# Patient Record
Sex: Female | Born: 1950
Health system: Southern US, Community
[De-identification: ages and names within clinical notes are randomized; demographics above are authoritative.]

## PROBLEM LIST (undated history)

## (undated) DIAGNOSIS — G43909 Migraine, unspecified, not intractable, without status migrainosus: Secondary | ICD-10-CM

## (undated) DIAGNOSIS — T4145XA Adverse effect of unspecified anesthetic, initial encounter: Secondary | ICD-10-CM

## (undated) DIAGNOSIS — I4891 Unspecified atrial fibrillation: Secondary | ICD-10-CM

## (undated) DIAGNOSIS — I10 Essential (primary) hypertension: Secondary | ICD-10-CM

## (undated) DIAGNOSIS — Z9289 Personal history of other medical treatment: Secondary | ICD-10-CM

## (undated) DIAGNOSIS — F419 Anxiety disorder, unspecified: Secondary | ICD-10-CM

## (undated) DIAGNOSIS — T8859XA Other complications of anesthesia, initial encounter: Secondary | ICD-10-CM

## (undated) DIAGNOSIS — Z8679 Personal history of other diseases of the circulatory system: Secondary | ICD-10-CM

## (undated) DIAGNOSIS — Z9989 Dependence on other enabling machines and devices: Secondary | ICD-10-CM

## (undated) DIAGNOSIS — G4733 Obstructive sleep apnea (adult) (pediatric): Secondary | ICD-10-CM

## (undated) DIAGNOSIS — K219 Gastro-esophageal reflux disease without esophagitis: Secondary | ICD-10-CM

## (undated) HISTORY — PX: HERNIA REPAIR: SHX51

## (undated) HISTORY — DX: Migraine, unspecified, not intractable, without status migrainosus: G43.909

## (undated) HISTORY — PX: WISDOM TOOTH EXTRACTION: SHX21

## (undated) HISTORY — DX: Essential (primary) hypertension: I10

## (undated) HISTORY — DX: Gastro-esophageal reflux disease without esophagitis: K21.9

---

## 2011-07-15 DIAGNOSIS — K219 Gastro-esophageal reflux disease without esophagitis: Secondary | ICD-10-CM | POA: Insufficient documentation

## 2011-07-15 DIAGNOSIS — I10 Essential (primary) hypertension: Secondary | ICD-10-CM | POA: Insufficient documentation

## 2011-09-16 ENCOUNTER — Ambulatory Visit: Payer: Self-pay | Admitting: Family Medicine

## 2013-05-17 ENCOUNTER — Other Ambulatory Visit: Payer: Self-pay | Admitting: Internal Medicine

## 2015-11-20 DIAGNOSIS — J324 Chronic pansinusitis: Secondary | ICD-10-CM | POA: Insufficient documentation

## 2015-11-20 DIAGNOSIS — K219 Gastro-esophageal reflux disease without esophagitis: Secondary | ICD-10-CM | POA: Insufficient documentation

## 2015-11-20 DIAGNOSIS — H903 Sensorineural hearing loss, bilateral: Secondary | ICD-10-CM | POA: Insufficient documentation

## 2015-11-20 DIAGNOSIS — J3089 Other allergic rhinitis: Secondary | ICD-10-CM | POA: Insufficient documentation

## 2015-11-20 DIAGNOSIS — H9313 Tinnitus, bilateral: Secondary | ICD-10-CM | POA: Insufficient documentation

## 2016-07-19 DIAGNOSIS — I10 Essential (primary) hypertension: Secondary | ICD-10-CM | POA: Insufficient documentation

## 2016-07-27 ENCOUNTER — Telehealth: Payer: Self-pay

## 2016-07-27 NOTE — Telephone Encounter (Signed)
NOTES SENT TO SCHEDULING.  °

## 2016-09-16 DIAGNOSIS — I480 Paroxysmal atrial fibrillation: Secondary | ICD-10-CM | POA: Insufficient documentation

## 2016-09-17 ENCOUNTER — Encounter (INDEPENDENT_AMBULATORY_CARE_PROVIDER_SITE_OTHER): Payer: Self-pay

## 2016-09-17 ENCOUNTER — Ambulatory Visit (INDEPENDENT_AMBULATORY_CARE_PROVIDER_SITE_OTHER): Payer: 59 | Admitting: Interventional Cardiology

## 2016-09-17 ENCOUNTER — Encounter: Payer: Self-pay | Admitting: Interventional Cardiology

## 2016-09-17 VITALS — BP 166/72 | HR 79 | Ht 65.0 in | Wt 217.0 lb

## 2016-09-17 DIAGNOSIS — R0683 Snoring: Secondary | ICD-10-CM

## 2016-09-17 DIAGNOSIS — I10 Essential (primary) hypertension: Secondary | ICD-10-CM

## 2016-09-17 DIAGNOSIS — I4892 Unspecified atrial flutter: Secondary | ICD-10-CM | POA: Diagnosis not present

## 2016-09-17 DIAGNOSIS — R9431 Abnormal electrocardiogram [ECG] [EKG]: Secondary | ICD-10-CM | POA: Diagnosis not present

## 2016-09-17 DIAGNOSIS — I456 Pre-excitation syndrome: Secondary | ICD-10-CM | POA: Diagnosis not present

## 2016-09-17 DIAGNOSIS — Z7901 Long term (current) use of anticoagulants: Secondary | ICD-10-CM | POA: Insufficient documentation

## 2016-09-17 DIAGNOSIS — R0789 Other chest pain: Secondary | ICD-10-CM | POA: Insufficient documentation

## 2016-09-17 NOTE — Progress Notes (Addendum)
Cardiology Office Note    Date:  09/17/2016   ID:  Mary Poole, DOB Feb 21, 1950, MRN [id number]  PCP:  Patient, No Pcp Per  Cardiologist: Lesleigh Noe, MD   Chief Complaint  Patient presents with  . Atrial Fibrillation    History of Present Illness:  Mary Poole is a 66 y.o. female presents as a new patient requesting cardiac assessment of recently diagnosed atrial fibrillation initially identified on 07/18/2016 at [facility] [employer].  The patient is very pleasant. She has had 2 episodes of sudden onset chest pressure with radiation to the left arm and severe dyspnea. The initial episode occurred in late May or very early June. It lasted approximately 20 minutes. On this occasion she called EMS but upon their arrival she had begun feeling better and nothing was found. Approximately 10 days later while in church in [place] she suddenly developed a recurrence of the same symptoms that included left arm discomfort, chest pressure, and dyspnea. Again emergency medical personnel were summoned. An EKG demonstrated atrial atrial flutter with one-to-one conduction at 265 bpm. She was admitted to the hospital, an approximately 25-30 minutes after admission a repeat electrocardiogram revealed atrial flutter with 2-1 AV conduction at a ventricular rate of 153 bpm. The heart rhythm reverted to normal sinus rhythm after a medication was started intravenously. She was discharged from the hospital on beta blocker therapy at a dose 4 times that she had previously taken, metoprolol succinate 25 mg increased to 50 mg twice daily. She has had no recurrent episodes since that time. Records have been reviewed. Echocardiogram demonstrated a structurally normal heart. Potassium was 3.3. Xarelto was also started based on a CHADS VASC of at least 3. No complications since starting Xarelto.  She is seeking a cardiology opinion concerning her ultimate management and  therapy.  Past Medical History:  Diagnosis Date  . Abnormal heart rhythm   . GERD (gastroesophageal reflux disease)   . Hypertension   . Migraine     Past Surgical History:  Procedure Laterality Date  . HERNIA REPAIR    . WISDOM TOOTH EXTRACTION      Current Medications: Outpatient Medications Prior to Visit  Medication Sig Dispense Refill  . metoprolol succinate (TOPROL-XL) 50 MG 24 hr tablet Take 50 mg by mouth daily.    Marland Kitchen omeprazole (PRILOSEC) 20 MG capsule Take 20 mg by mouth daily.    . rivaroxaban (XARELTO) 20 MG TABS tablet Take 20 mg by mouth daily.    Marland Kitchen aspirin EC 81 MG tablet Take 81 mg by mouth daily.     No facility-administered medications prior to visit.      Allergies:   Diphenhydramine hcl   Social History   Social History  . Marital status: Single    Spouse name: N/A  . Number of children: N/A  . Years of education: N/A   Social History Main Topics  . Smoking status: Never Smoker  . Smokeless tobacco: Never Used  . Alcohol use No  . Drug use: No  . Sexual activity: Not Asked   Other Topics Concern  . None   Social History Narrative  . None     Family History:  The patient's family history is not on file. There is no family history of sudden cardiac death, or congestive heart failure. A younger sister who is morbidly obese has had coronary bypass.  ROS:   Please see the history of present illness.  Recent chills, chest pain when her heart is racing, cough, shortness of breath, abdominal pain, dizziness, headache, difficulty with balance. Occasional wheezing, irregular heartbeats, and chest pressure. Snores loudly. Her snoring alarms her husband (patient reports this based on conversations with him).  All other systems reviewed and are negative.   PHYSICAL EXAM:   VS:  BP (!) 166/72 (BP Location: Left Arm)   Pulse 79   Ht 5\' 5"  (1.651 m)   Wt 217 lb (98.4 kg)   BMI 36.11 kg/m    GEN: Well nourished, well developed, in no acute  distress  HEENT: normal  Neck: no JVD, carotid bruits, or masses Cardiac: RRR; no murmurs, rubs, or gallops,no edema  Respiratory:  clear to auscultation bilaterally, normal work of breathing GI: soft, nontender, nondistended, + BS MS: no deformity or atrophy  Skin: warm and dry, no rash Neuro:  Alert and Oriented x 3, Strength and sensation are intact Psych: euthymic mood, full affect  Wt Readings from Last 3 Encounters:  09/17/16 217 lb (98.4 kg)      Studies/Labs Reviewed:   EKG:  EKG  Normal sinus rhythm with short PR interval at 116 ms. Nonspecific T wave flattening noted diffusely. When compared to [facility] the PR interval on the current EKG is slightly longer (previously 96 ms). ST segment and T-wave abnormality on the current EKG is new compared to the old tracing.  Recent Labs: No results found for requested labs within last 8760 hours.   Lipid Panel No results found for: CHOL, TRIG, HDL, CHOLHDL, VLDL, LDLCALC, LDLDIRECT  Additional studies/ records that were reviewed today include:  [facility]ch[facility]ogram performed [facility] on 07/19/2016 demonstrated normal left ventricular function, borderline left ventricular enlargement, diastolic dysfunction, and mild mitral regurgitation. Estimated EF is 55%.  Initial laboratory data revealed a potassium of 3.3, creatinine of 1.07, troponin negative 2 0.02, BNP 147. Hemoglobin A1c is 6.7. LDL is 106.   ASSESSMENT:    1. Atrial flutter with rapid ventricular response (HCC)   2. Chest pressure   3. Shortened PR interval   4. Accessory atrioventricular pathway   5. Snoring   6. Essential hypertension   7. On continuous oral anticoagulation      PLAN:  In order of problems listed above:  1. Agree with continuing full anticoagulation therapy given the patient's risk profile for possible embolic CVA. Continue 50 mg twice a day metoprolol  succinate. Notify us if recurrent weakness/palpitations/chest discomfort. 48-hour Holter monitor will be performed to exclude brief recurrences of atrial fibrillation or atrial flutter. In reviewing the clinical data, it is obvious that the patient has an accessory pathway being able to conduct 1: 1 while in atrial flutter. Her resting EKG also demonstrates evidence of preexcitation with a short PR interval (Lown-Ganong-Levine). We will refer her to electrophysiology for further management. 2. Chest discomfort was likely ischemia mediated, most likely in the setting of supply demand mismatch. A stress Myoview study will be done to exclude obstructive coronary disease given the elevated A1c and her history of hypertension. I have asked her to discontinue aspirin. 3. Probable LGL 4. As discussed above and related to LGL 5. I am suspicious she has sleep apnea which could possibly be a precipitant for her atrial arrhythmia. A sleep study has been scheduled. 6. There is adequate blood pressure control currently. Target should be 130/90 mmHg a less. 7. Continue Xarelto.  66 year old female with recurring episodes of  atrial flutter and documentation of 1:1 AV conduction on EKG. We'll refer to EP for consideration of ablation. A sleep study will be done to rule out OSA as a precipitant for the atrial arrhythmia. An ischemic workup will be done to exclude underlying coronary disease in this patient with multiple risk factors including hypertension, elevated hemoglobin A1c, and age.  Prolonged initial visit with greater than 50% of the time spent in counseling and record review from multiple prior encounters over the past 6 weeks. Clinical follow-up in 3-4 months unless recurrent tachycardia.  Medication Adjustments/Labs and Tests Ordered: Current medicines are reviewed at length with the patient today.  Concerns regarding medicines are outlined above.  Medication changes, Labs and Tests ordered today are listed  in the Patient Instructions below. Patient Instructions  Medication Instructions:  1) STOP Aspirin  Labwork: None  Testing/Procedures: Your physician has recommended that you have a sleep study. This test records several body functions during sleep, including: brain activity, eye movement, oxygen and carbon dioxide blood levels, heart rate and rhythm, breathing rate and rhythm, the flow of air through your mouth and nose, snoring, body muscle movements, and chest and belly movement.  Your physician has requested that you have en exercise stress myoview. For further information please visit [email or web address] Please follow instruction sheet, as given.  Your physician has recommended that you wear a 48 hour holter monitor. Holter monitors are medical devices that record the heart's electrical activity. Doctors most often use these monitors to diagnose arrhythmias. Arrhythmias are problems with the speed or rhythm of the heartbeat. The monitor is a small, portable device. You can wear one while you do your normal daily activities. This is usually used to diagnose what is causing palpitations/syncope (passing out).    Follow-Up: Your physician recommends that you schedule a follow-up appointment next available with one of our electrophysiologist.  Your physician wants you to follow-up in: 6 months with Dr. Katrinka Blazing.  You will receive a reminder letter in the mail two months in advance. If you don't receive a letter, please call our office to schedule the follow-up appointment.    Any Other Special Instructions Will Be Listed Below (If Applicable).     If you need a refill on your cardiac medications before your next appointment, please call your pharmacy.      Signed, Lesleigh Noe, MD  09/17/2016 5:42 PM    [facility] [address] [place], [place], [place]  [address] Phone: [phone]; Fax: [phone]

## 2016-09-17 NOTE — Patient Instructions (Signed)
Medication Instructions:  1) STOP Aspirin  Labwork: None  Testing/Procedures: Your physician has recommended that you have a sleep study. This test records several body functions during sleep, including: brain activity, eye movement, oxygen and carbon dioxide blood levels, heart rate and rhythm, breathing rate and rhythm, the flow of air through your mouth and nose, snoring, body muscle movements, and chest and belly movement.  Your physician has requested that you have en exercise stress myoview. For further information please visit [email or web address]. Please follow instruction sheet, as given.  Your physician has recommended that you wear a 48 hour holter monitor. Holter monitors are medical devices that record the heart's electrical activity. Doctors most often use these monitors to diagnose arrhythmias. Arrhythmias are problems with the speed or rhythm of the heartbeat. The monitor is a small, portable device. You can wear one while you do your normal daily activities. This is usually used to diagnose what is causing palpitations/syncope (passing out).    Follow-Up: Your physician recommends that you schedule a follow-up appointment next available with one of our electrophysiologist.  Your physician wants you to follow-up in: 6 months with Dr. Katrinka BlazingSmith.  You will receive a reminder letter in the mail two months in advance. If you don't receive a letter, please call our office to schedule the follow-up appointment.    Any Other Special Instructions Will Be Listed Below (If Applicable).     If you need a refill on your cardiac medications before your next appointment, please call your pharmacy.

## 2016-09-29 ENCOUNTER — Encounter: Payer: Self-pay | Admitting: Cardiology

## 2016-09-29 ENCOUNTER — Telehealth (HOSPITAL_COMMUNITY): Payer: Self-pay | Admitting: *Deleted

## 2016-09-29 NOTE — Telephone Encounter (Signed)
Attempted to call patient regarding upcoming nuclear appointment- no answer.  Mary Poole  

## 2016-10-01 ENCOUNTER — Ambulatory Visit (INDEPENDENT_AMBULATORY_CARE_PROVIDER_SITE_OTHER): Payer: 59

## 2016-10-01 ENCOUNTER — Ambulatory Visit (HOSPITAL_COMMUNITY): Payer: 59 | Attending: Cardiovascular Disease

## 2016-10-01 DIAGNOSIS — R0609 Other forms of dyspnea: Secondary | ICD-10-CM | POA: Diagnosis not present

## 2016-10-01 DIAGNOSIS — R0789 Other chest pain: Secondary | ICD-10-CM | POA: Diagnosis not present

## 2016-10-01 DIAGNOSIS — Z8249 Family history of ischemic heart disease and other diseases of the circulatory system: Secondary | ICD-10-CM | POA: Insufficient documentation

## 2016-10-01 DIAGNOSIS — I4892 Unspecified atrial flutter: Secondary | ICD-10-CM

## 2016-10-01 DIAGNOSIS — I10 Essential (primary) hypertension: Secondary | ICD-10-CM | POA: Diagnosis not present

## 2016-10-01 DIAGNOSIS — I4891 Unspecified atrial fibrillation: Secondary | ICD-10-CM | POA: Insufficient documentation

## 2016-10-01 DIAGNOSIS — I2510 Atherosclerotic heart disease of native coronary artery without angina pectoris: Secondary | ICD-10-CM | POA: Insufficient documentation

## 2016-10-01 DIAGNOSIS — R079 Chest pain, unspecified: Secondary | ICD-10-CM | POA: Diagnosis present

## 2016-10-01 MED ORDER — TECHNETIUM TC 99M TETROFOSMIN IV KIT
33.0000 | PACK | Freq: Once | INTRAVENOUS | Status: AC | PRN
  Administered 2016-10-01: 33 via INTRAVENOUS
  Filled 2016-10-01: qty 33

## 2016-10-01 MED ORDER — REGADENOSON 0.4 MG/5ML IV SOLN
0.4000 mg | Freq: Once | INTRAVENOUS | Status: AC
  Administered 2016-10-01: 0.4 mg via INTRAVENOUS

## 2016-10-06 ENCOUNTER — Ambulatory Visit (HOSPITAL_COMMUNITY): Payer: 59 | Attending: Cardiology

## 2016-10-06 ENCOUNTER — Telehealth: Payer: Self-pay | Admitting: *Deleted

## 2016-10-06 LAB — MYOCARDIAL PERFUSION IMAGING
CHL CUP NUCLEAR SDS: 4
CHL CUP NUCLEAR SRS: 10
CHL CUP RESTING HR STRESS: 80 {beats}/min
CSEPPHR: 106 {beats}/min
LHR: 0.27
LV dias vol: 87 mL (ref 46–106)
LV sys vol: 36 mL
NUC STRESS TID: 1.06
SSS: 14

## 2016-10-06 MED ORDER — TECHNETIUM TC 99M TETROFOSMIN IV KIT
32.0000 | PACK | Freq: Once | INTRAVENOUS | Status: AC | PRN
  Administered 2016-10-06: 32 via INTRAVENOUS
  Filled 2016-10-06: qty 32

## 2016-10-06 NOTE — Telephone Encounter (Addendum)
Informed patient of upcoming sleep study and patient understanding was verbalized. Patient understands her sleep study is scheduled for Sunday November 14 2016. Patient understands [facility]. Patient understands she will receive a sleep packet in a week or so. Patient understands to call if she does not receive the sleep packet in a timely manner. Patient agrees with treatment and thanked me for call. ESS= 14

## 2016-10-15 ENCOUNTER — Other Ambulatory Visit: Payer: Self-pay | Admitting: *Deleted

## 2016-10-15 MED ORDER — RIVAROXABAN 20 MG PO TABS
20.0000 mg | ORAL_TABLET | Freq: Every day | ORAL | 2 refills | Status: DC
Start: 2016-10-15 — End: 2017-02-24

## 2016-10-18 ENCOUNTER — Telehealth: Payer: Self-pay | Admitting: *Deleted

## 2016-10-18 NOTE — Telephone Encounter (Signed)
Attempted to call pt.  Message came on stating not available and to try again later.  Then just had beeping.  Unable to leave message.  Will try again later.

## 2016-10-18 NOTE — Telephone Encounter (Signed)
Patient called and requested a refill on metoprolol however she stated that she is taking metoprolol tartrate 50 mg bid but her med list has metoprolol succinate 50 mg qd listed. Please advise. Thanks, MI

## 2016-10-19 NOTE — Telephone Encounter (Signed)
Attempted to contact pt and received same message and beeping.  Will try again later.

## 2016-10-20 ENCOUNTER — Encounter: Payer: Self-pay | Admitting: Cardiology

## 2016-10-20 ENCOUNTER — Ambulatory Visit (INDEPENDENT_AMBULATORY_CARE_PROVIDER_SITE_OTHER): Payer: 59 | Admitting: Cardiology

## 2016-10-20 VITALS — BP 158/100 | HR 84 | Ht 65.0 in | Wt 215.6 lb

## 2016-10-20 DIAGNOSIS — I10 Essential (primary) hypertension: Secondary | ICD-10-CM

## 2016-10-20 DIAGNOSIS — I4892 Unspecified atrial flutter: Secondary | ICD-10-CM | POA: Diagnosis not present

## 2016-10-20 MED ORDER — CARVEDILOL 12.5 MG PO TABS: 12.5000 mg | ORAL_TABLET | Freq: Two times a day (BID) | ORAL | 3 refills | Status: DC

## 2016-10-20 NOTE — Progress Notes (Signed)
Electrophysiology Office Note   Date:  10/20/2016   ID:  Mary Poole, DOB 24-Jul-1950, MRN [id number]  PCP:  Patient, No Pcp Per  Cardiologist:  Katrinka Blazing Primary Electrophysiologist:  Mary Porto Jorja Loa, MD    Chief Complaint  Patient presents with  . Advice Only    AFlutter/Discuss ablation     History of Present Illness: Mary Poole is a 66 y.o. female who is being seen today for the evaluation of atrial flutter at the request of No ref. provider found. Presenting today for electrophysiology evaluation. She has a history of hypertension and palpitations. She was diagnosed with atrial fibrillation on 07/18/16. She had 2 episodes of chest pressure with radiation to her arm in late May or early June. It lasted 20 minutes. EMS was called but she felt better. 10 days later, she had similar symptoms. She went to the emergency room where she had possible atrial flutter with one-to-one conduction at a rate of 265 bpm. 25-30 minutes after admission repeat EKG showed atrial flutter with 2 to one conduction and a ventricular rate in the 153. She was given IV medications and she reverted to sinus rhythm. She was discharged on beta blocker. She was started on Xarelto.    Today, she denies symptoms of palpitations, chest pain, shortness of breath, orthopnea, PND, lower extremity edema, claudication, dizziness, presyncope, syncope, bleeding, or neurologic sequela. The patient is tolerating medications without difficulties.    Past Medical History:  Diagnosis Date  . Abnormal heart rhythm   . GERD (gastroesophageal reflux disease)   . Hypertension   . Migraine    Past Surgical History:  Procedure Laterality Date  . HERNIA REPAIR    . WISDOM TOOTH EXTRACTION       Current Outpatient Prescriptions  Medication Sig Dispense Refill  . metoprolol succinate (TOPROL-XL) 50 MG 24 hr tablet Take 50 mg by mouth daily.    Marland Kitchen omeprazole (PRILOSEC) 20 MG capsule Take 20 mg by mouth daily.    .  rivaroxaban (XARELTO) 20 MG TABS tablet Take 1 tablet (20 mg total) by mouth daily. 90 tablet 2   No current facility-administered medications for this visit.     Allergies:   Diphenhydramine hcl   Social History:  The patient  reports that she has never smoked. She has never used smokeless tobacco. She reports that she does not drink alcohol or use drugs.   Family History:  The patient's family history is not on file.    ROS:  Please see the history of present illness.   Otherwise, review of systems is positive for none.   All other systems are reviewed and negative.    PHYSICAL EXAM: VS:  BP (!) 158/100   Pulse 84   Ht  (1.651 m)   Wt 215 lb 9.6 oz (97.8 kg)   BMI 35.88 kg/m  , BMI Body mass index is 35.88 kg/m. GEN: Well nourished, well developed, in no acute distress  HEENT: normal  Neck: no JVD, carotid bruits, or masses Cardiac: RRR; no murmurs, rubs, or gallops,no edema  Respiratory:  clear to auscultation bilaterally, normal work of breathing GI: soft, nontender, nondistended, + BS MS: no deformity or atrophy  Skin: warm and dry Neuro:  Strength and sensation are intact Psych: euthymic mood, full affect  EKG:  EKG is not ordered today. Personal review of the ekg ordered 09/17/16 shows sinus rhythm, short PR interval, 116 ms  Recent Labs: No results found for requested labs  within last 8760 hours.    Lipid Panel  No results found for: CHOL, TRIG, HDL, CHOLHDL, VLDL, LDLCALC, LDLDIRECT   Wt Readings from Last 3 Encounters:  10/20/16 215 lb 9.6 oz (97.8 kg)  09/17/16 217 lb (98.4 kg)      Other studies Reviewed: Additional studies/ records that were reviewed today include: Myoview 10/06/16  Review of the above records today demonstrates:   Nuclear stress EF: 58%. The left ventricular ejection fraction is normal (55-65%).  The study is normal. There is breast attenuation. No ischemia . no evidence of infarction  This is a low risk study.  Holter  10/09/16 - personally reviewed  Basic rhythm is NSR rare PAC's and PVC's.  Brief SVT < 10 beats.  Heart rate range 43-113 bpm with average 63 bpm.   NSR Normal study  TTE 07/19/16 LEFT VENTRICLE The left ventricle is borderline dilated. There is normal left ventricular  wall thickness. Left ventricular systolic function is normal. LV ejection  fraction = 50-55%. Left ventricular filling pattern is indeterminate. No  segmental wall motion abnormalities seen in the left ventricle. LEFT ATRIUM The left atrium is mildly dilated. AORTIC VALVE The aortic valve is not well visualized. There is no aortic regurgitation.    ASSESSMENT AND PLAN:  1.  Atrial flutter with rapid ventricular response: Currently on Xarelto. She is felt well since starting her Toprol-XL. We'll switch her to carvedilol 12.5 mg twice a day. We discussed ablation. Risks and benefits were discussed. Risks include bleeding, tamponade, heart block, stroke, and damage to surrounding organs. She would like to try medical management at this time. She does not have an obvious delta wave, I feel it is safe, and it is likely that rate control Gertrude Tarbet help to decrease the rate of 1:1 flutter in the future. In the interim, we'll work to get the records from her hospital visit as well as her EKGs of tachycardia.  This patients CHA2DS2-VASc Score and unadjusted Ischemic Stroke Rate (% per year) is equal to 3.2 % stroke rate/year from a score of 3  Above score calculated as 1 point each if present [CHF, HTN, DM, Vascular=MI/PAD/Aortic Plaque, Age if 65-74, or Female] Above score calculated as 2 points each if present [Age > 75, or Stroke/TIA/TE]  2. Short PR interval: Possibly has an accessory pathway, but there is no obvious a delta wave seen on her EKG. She could simply have very fast AV conduction, though this would be abnormal. We'll do an EP study to determine the presence of a pathway.  3. Hypertension: Blood pressure elevated  today. We'll switch from Toprol-XL to carvedilol.  4. Snoring: Sleep study scheduled.     Current medicines are reviewed at length with the patient today.   The patient does not have concerns regarding her medicines.  The following changes were made today:  Stop Toprol-XL, start carvedilol  Labs/ tests ordered today include:  No orders of the defined types were placed in this encounter.    Disposition:   FU with Keaira Whitehurst 3 months  Signed, Iraida Cragin Jorja LoaMartin Vernisha Bacote, MD  10/20/2016 2:11 PM     [facility] [address] [place] [id number] [phone] (office) [phone] (fax)

## 2016-10-20 NOTE — Patient Instructions (Addendum)
Medication Instructions:  Your physician has recommended you make the following change in your medication: 1. STOP Toprol 2. START Carvedilol 12.5 mg twice daily   If you need a refill on your cardiac medications before your next appointment, please call your pharmacy.   Labwork: None ordered  Testing/Procedures: None ordered  Follow-Up: Your physician recommends that you schedule a follow-up appointment in: 3 months with Dr. Elberta Fortisamnitz.  Thank you for choosing [facility]!!   Dory HornSherri Natalyah Cummiskey, RN [phone]  Any Other Special Instructions Will Be Listed Below (If Applicable).  Carvedilol tablets What is this medicine? CARVEDILOL (KAR ve dil ol) is a beta-blocker. Beta-blockers reduce the workload on the heart and help it to beat more regularly. This medicine is used to treat high blood pressure and heart failure. This medicine may be used for other purposes; ask your health care provider or pharmacist if you have questions. COMMON BRAND NAME(S): Coreg What should I tell my health care provider before I take this medicine? They need to know if you have any of these conditions: -circulation problems -diabetes -history of heart attack or heart disease -liver disease -lung or breathing disease, like asthma or emphysema -pheochromocytoma -slow or irregular heartbeat -thyroid disease -an unusual or allergic reaction to carvedilol, other beta-blockers, medicines, foods, dyes, or preservatives -pregnant or trying to get pregnant -breast-feeding How should I use this medicine? Take this medicine by mouth with a glass of water. Follow the directions on the prescription label. It is best to take the tablets with food. Take your doses at regular intervals. Do not take your medicine more often than directed. Do not stop taking except on the advice of your doctor or health care professional. Talk to your pediatrician regarding the use of this medicine in children. Special care may be  needed. Overdosage: If you think you have taken too much of this medicine contact a poison control center or emergency room at once. NOTE: This medicine is only for you. Do not share this medicine with others. What if I miss a dose? If you miss a dose, take it as soon as you can. If it is almost time for your next dose, take only that dose. Do not take double or extra doses. What may interact with this medicine? This medicine may interact with the following medications: -certain medicines for blood pressure, heart disease, irregular heart beat -certain medicines for depression, like fluoxetine or paroxetine -certain medicines for diabetes, like glipizide or glyburide -cimetidine -clonidine -cyclosporine -digoxin -MAOIs like Carbex, Eldepryl, Marplan, Nardil, and Parnate -reserpine -rifampin This list may not describe all possible interactions. Give your health care provider a list of all the medicines, herbs, non-prescription drugs, or dietary supplements you use. Also tell them if you smoke, drink alcohol, or use illegal drugs. Some items may interact with your medicine. What should I watch for while using this medicine? Check your heart rate and blood pressure regularly while you are taking this medicine. Ask your doctor or health care professional what your heart rate and blood pressure should be, and when you should contact him or her. Do not stop taking this medicine suddenly. This could lead to serious heart-related effects. Contact your doctor or health care professional if you have difficulty breathing while taking this drug. Check your weight daily. Ask your doctor or health care professional when you should notify him/her of any weight gain. You may get drowsy or dizzy. Do not drive, use machinery, or do anything that requires mental alertness  until you know how this medicine affects you. To reduce the risk of dizzy or fainting spells, do not sit or stand up quickly. Alcohol can make  you more drowsy, and increase flushing and rapid heartbeats. Avoid alcoholic drinks. If you have diabetes, check your blood sugar as directed. Tell your doctor if you have changes in your blood sugar while you are taking this medicine. If you are going to have surgery, tell your doctor or health care professional that you are taking this medicine. What side effects may I notice from receiving this medicine? Side effects that you should report to your doctor or health care professional as soon as possible: -allergic reactions like skin rash, itching or hives, swelling of the face, lips, or tongue -breathing problems -dark urine -irregular heartbeat -swollen legs or ankles -vomiting -yellowing of the eyes or skin Side effects that usually do not require medical attention (report to your doctor or health care professional if they continue or are bothersome): -change in sex drive or performance -diarrhea -dry eyes (especially if wearing contact lenses) -dry, itching skin -headache -nausea -unusually tired This list may not describe all possible side effects. Call your doctor for medical advice about side effects. You may report side effects to FDA at 1-800-FDA-1088. Where should I keep my medicine? Keep out of the reach of children. Store at room temperature below 30 degrees C (86 degrees F). Protect from moisture. Keep container tightly closed. Throw away any unused medicine after the expiration date. NOTE: This sheet is a summary. It may not cover all possible information. If you have questions about this medicine, talk to your doctor, pharmacist, or health care provider.  2018 Elsevier/Gold Standard (2012-10-01 14:12:02)

## 2016-10-21 ENCOUNTER — Telehealth: Payer: Self-pay | Admitting: Cardiology

## 2016-10-21 NOTE — Telephone Encounter (Signed)
[facility].

## 2016-10-21 NOTE — Telephone Encounter (Signed)
Pt seen by Dr. Elberta Fortisamnitz yesterday and switched from Metoprolol to Carvedilol.

## 2016-10-25 ENCOUNTER — Telehealth: Payer: Self-pay | Admitting: Cardiology

## 2016-10-25 NOTE — Telephone Encounter (Signed)
Records received From [facility] placed in Chart Prep.

## 2016-11-09 ENCOUNTER — Encounter: Payer: Self-pay | Admitting: *Deleted

## 2016-11-14 ENCOUNTER — Encounter (HOSPITAL_BASED_OUTPATIENT_CLINIC_OR_DEPARTMENT_OTHER): Payer: 59

## 2016-11-25 ENCOUNTER — Telehealth: Payer: Self-pay | Admitting: *Deleted

## 2016-11-25 NOTE — Telephone Encounter (Signed)
-----   Message from Haywood FillerAmy C Higgins sent at 11/25/2016  1:56 PM EDT ----- Regarding: In lab denied-need to cancel In lab study has been denied. Have sent message to Dr. Katrinka BlazingSmith to advise how to proceed. Study needs to be canceled for now. Thanks, Amy

## 2016-11-25 NOTE — Telephone Encounter (Signed)
Sleep study cancelled 11/25/2016.

## 2016-11-26 ENCOUNTER — Encounter (HOSPITAL_BASED_OUTPATIENT_CLINIC_OR_DEPARTMENT_OTHER): Payer: 59

## 2016-11-29 NOTE — Telephone Encounter (Signed)
RE: in lab denied  Mary Poole, Amy C  [name], CMA        Dr. Katrinka BlazingSmith has approved in home study. Thanks, Amy    Informed patient of upcoming Home Sleep study and patient understanding was verbalized. Patient understands her sleep [facility]. Patient understands she will receive a CALL in a week or so. Patient understands to call if she does not receive that CALL in a timely manner. Patient agrees with treatment and thanked me for call.

## 2016-12-07 NOTE — Telephone Encounter (Signed)
All paperwork needed faxed to [facility].

## 2016-12-17 ENCOUNTER — Telehealth: Payer: Self-pay | Admitting: Interventional Cardiology

## 2016-12-17 NOTE — Telephone Encounter (Signed)
Patient informed that the results have not been read by Dr. Mayford Knifeurner and she will receive a call from Coralee Northina once the results have been read. Patient verbalized understanding and thanked me for the call.

## 2016-12-17 NOTE — Telephone Encounter (Signed)
ollow Up:    Pt would like the results from her in home sleep study results please.

## 2016-12-21 ENCOUNTER — Telehealth: Payer: Self-pay | Admitting: *Deleted

## 2016-12-21 DIAGNOSIS — G4733 Obstructive sleep apnea (adult) (pediatric): Secondary | ICD-10-CM

## 2016-12-21 DIAGNOSIS — R0902 Hypoxemia: Secondary | ICD-10-CM

## 2016-12-21 NOTE — Telephone Encounter (Signed)
-----   Message from Quintella Reichertraci R Turner, MD sent at 12/19/2016  9:19 PM EST ----- Mild to moderate OSA with Oxygen desats at low as 80%  - please set up in lab CPAP titration

## 2016-12-21 NOTE — Telephone Encounter (Signed)
Informed patient of sleep study results and patient understanding was verbalized. Patient understands she has mild to moderate OSA with oxygen desats as low as 80%. Patient understands Dr Mayford Knifeurner recommends an in lab CPAP titration. Patient agrees with treatment plan and thanked me for calling.

## 2017-01-18 ENCOUNTER — Ambulatory Visit: Payer: 59 | Admitting: Cardiology

## 2017-01-19 ENCOUNTER — Other Ambulatory Visit: Payer: Self-pay | Admitting: *Deleted

## 2017-01-20 MED ORDER — CARVEDILOL 12.5 MG PO TABS: 12.5000 mg | ORAL_TABLET | Freq: Two times a day (BID) | ORAL | 3 refills | Status: DC

## 2017-01-21 ENCOUNTER — Ambulatory Visit: Payer: 59 | Admitting: Cardiology

## 2017-01-21 ENCOUNTER — Encounter: Payer: Self-pay | Admitting: Cardiology

## 2017-01-21 VITALS — BP 124/82 | HR 87 | Ht 65.0 in | Wt 216.0 lb

## 2017-01-21 DIAGNOSIS — I10 Essential (primary) hypertension: Secondary | ICD-10-CM

## 2017-01-21 DIAGNOSIS — I483 Typical atrial flutter: Secondary | ICD-10-CM

## 2017-01-21 DIAGNOSIS — G4733 Obstructive sleep apnea (adult) (pediatric): Secondary | ICD-10-CM

## 2017-01-21 NOTE — Patient Instructions (Signed)
Medication Instructions:  Your physician recommends that you continue on your current medications as directed. Please refer to the Current Medication list given to you today.  If you need a refill on your cardiac medications before your next appointment, please call your pharmacy.   Labwork: None ordered  Testing/Procedures: None ordered  Follow-Up: Your physician wants you to follow-up in: 6 months with Dr. Camnitz.  You will receive a reminder letter in the mail two months in advance. If you don't receive a letter, please call our office to schedule the follow-up appointment.  Thank you for choosing [facility]!!   Michel Eskelson, RN [phone]         

## 2017-01-21 NOTE — Progress Notes (Signed)
Electrophysiology Office Note   Date:  01/21/2017   ID:  Mary Poole, DOB 05/10/1950, MRN [id number]  PCP:  Patient, No Pcp Per  Cardiologist:  Katrinka BlazingSmith Primary Electrophysiologist:  Jarrette Dehner Jorja LoaMartin Lakashia Collison, MD    Chief Complaint  Patient presents with  . Atrial Flutter     History of Present Illness: Mary Poole is a 66 y.o. female who is being seen today for the evaluation of atrial flutter at the request of No ref. provider found. Presenting today for electrophysiology evaluation. She has a history of hypertension and palpitations. She was diagnosed with atrial fibrillation on 07/18/16. She had 2 episodes of chest pressure with radiation to her arm in late May or early June. It lasted 20 minutes. EMS was called but she felt better. 10 days later, she had similar symptoms.  Went to the emergency room where she had a wide-complex tachycardia.  While in the emergency room, her rate slowed and showed a likely atrial flutter.  It was thought that this was the same rhythm with the wide-complex tachycardia being apparent 1-1 conduction.  She reverted to sinus rhythm with IV medications.    Today, denies symptoms of palpitations, chest pain, shortness of breath, orthopnea, PND, lower extremity edema, claudication, dizziness, presyncope, syncope, bleeding, or neurologic sequela. The patient is tolerating medications without difficulties.  She has been feeling well without major complaint.  She has not noted any further episodes of atrial flutter.  She has determined that caffeine is a trigger for her rhythm abnormalities.   Past Medical History:  Diagnosis Date  . Abnormal heart rhythm   . GERD (gastroesophageal reflux disease)   . Hypertension   . Migraine    Past Surgical History:  Procedure Laterality Date  . HERNIA REPAIR    . WISDOM TOOTH EXTRACTION       Current Outpatient Medications  Medication Sig Dispense Refill  . carvedilol (COREG) 12.5 MG tablet Take 1 tablet (12.5 mg  total) by mouth 2 (two) times daily. 60 tablet 3  . omeprazole (PRILOSEC) 20 MG capsule Take 20 mg by mouth daily.    . rivaroxaban (XARELTO) 20 MG TABS tablet Take 1 tablet (20 mg total) by mouth daily. 90 tablet 2   No current facility-administered medications for this visit.     Allergies:   Diphenhydramine hcl   Social History:  The patient  reports that  has never smoked. she has never used smokeless tobacco. She reports that she does not drink alcohol or use drugs.   Family History:  The patient's family history includes AAA (abdominal aortic aneurysm) in her sister; CAD in her father; Thyroid disease in her sister.    ROS:  Please see the history of present illness.   Otherwise, review of systems is positive for snoring, wheezing, dyspnea on exertion.   All other systems are reviewed and negative.   PHYSICAL EXAM: VS:  BP 124/82   Pulse 87   Ht 5\' 5"  (1.651 m)   Wt 216 lb (98 kg)   BMI 35.94 kg/m  , BMI Body mass index is 35.94 kg/m. GEN: Well nourished, well developed, in no acute distress  HEENT: normal  Neck: no JVD, carotid bruits, or masses Cardiac: RRR; no murmurs, rubs, or gallops,no edema  Respiratory:  clear to auscultation bilaterally, normal work of breathing GI: soft, nontender, nondistended, + BS MS: no deformity or atrophy  Skin: warm and dry Neuro:  Strength and sensation are intact Psych: euthymic mood, full  affect  EKG:  EKG is ordered today. Personal review of the ekg ordered shows SR, short PR 118 msec   Recent Labs: No results found for requested labs within last 8760 hours.    Lipid Panel  No results found for: CHOL, TRIG, HDL, CHOLHDL, VLDL, LDLCALC, LDLDIRECT   Wt Readings from Last 3 Encounters:  01/21/17 216 lb (98 kg)  10/20/16 215 lb 9.6 oz (97.8 kg)  09/17/16 217 lb (98.4 kg)      Other studies Reviewed: Additional studies/ records that were reviewed today include: Myoview 10/06/16  Review of the above records today  demonstrates:   Nuclear stress EF: 58%. The left ventricular ejection fraction is normal (55-65%).  The study is normal. There is breast attenuation. No ischemia . no evidence of infarction  This is a low risk study.  Holter 10/09/16 - personally reviewed  Basic rhythm is NSR rare PAC's and PVC's.  Brief SVT < 10 beats.  Heart rate range 43-113 bpm with average 63 bpm.   NSR Normal study  TTE 07/19/16 LEFT VENTRICLE The left ventricle is borderline dilated. There is normal left ventricular  wall thickness. Left ventricular systolic function is normal. LV ejection  fraction = 50-55%. Left ventricular filling pattern is indeterminate. No  segmental wall motion abnormalities seen in the left ventricle. LEFT ATRIUM The left atrium is mildly dilated. AORTIC VALVE The aortic valve is not well visualized. There is no aortic regurgitation.    ASSESSMENT AND PLAN:  1.  Atrial flutter with rapid ventricular response: Currently on Xarelto and carvedilol.  She had wanted to try medical management prior to ablation.  EKGs scanned into the chart show a wide-complex tachycardia transitions to atrial flutter which appears to be typical.  It is likely that this was a one-to-one atrial flutter.  Feeling well on her current treatment.  No changes at the  This patients CHA2DS2-VASc Score and unadjusted Ischemic Stroke Rate (% per year) is equal to 3.2 % stroke rate/year from a score of 3  Above score calculated as 1 point each if present [CHF, HTN, DM, Vascular=MI/PAD/Aortic Plaque, Age if 65-74, or Female] Above score calculated as 2 points each if present [Age > 75, or Stroke/TIA/TE]   2. Short PR interval: Possibly has an accessory pathway but no obvious delta wave seen on her EKG.  She likely just has very fast AV conduction.  Should she have recurrent atrial flutter, we Antonette Hendricks plan to do an EP study.  No other changes at this time.  3. Hypertension: Well-controlled today.  No changes.  4.  OSA: Had a home sleep study but was told she needed a sleep study at the sleep clinic.  Awaiting to schedule.     Current medicines are reviewed at length with the patient today.   The patient does not have concerns regarding her medicines.  The following changes were made today:  none  Labs/ tests ordered today include:  Orders Placed This Encounter  Procedures  . EKG 12-Lead     Disposition:   FU with Kelaiah Escalona 6 months  Signed, Letita Prentiss Jorja LoaMartin Qamar Rosman, MD  01/21/2017 2:51 PM     [facility] [address] [place] [address] [phone] (office) [phone] (fax)

## 2017-02-08 DIAGNOSIS — Z9289 Personal history of other medical treatment: Secondary | ICD-10-CM

## 2017-02-08 HISTORY — DX: Personal history of other medical treatment: Z92.89

## 2017-02-15 ENCOUNTER — Telehealth: Payer: Self-pay | Admitting: Cardiology

## 2017-02-15 NOTE — Telephone Encounter (Signed)
Mary Poole is calling because she has been feeling extreme fatigue and light headiness. Also she can hear her heart beating in her ears. Please call

## 2017-02-15 NOTE — Telephone Encounter (Signed)
Pt reports fatigue/light headedness that comes and goes since weekend.  She has experienced SOB since Saturday.   She also reports "I can feel my heartbeat beating in my ears" (pt dx w/ tinnitus 2 yrs ago)  Denies any CP, syncope and/or swelling. She states that Saturday was extreme fatigue and by Sunday she couldn't do anything.  However, by Monday she was slightly improved and went to work.  Today she is better, but not completely. BP WNL & HRs avg 77-82. Pt understands I will have Dr. Elberta Fortisamnitz review and call her w/ any recommendation/s he may have. Patient verbalized understanding and agreeable to plan.

## 2017-02-17 ENCOUNTER — Telehealth: Payer: Self-pay | Admitting: Cardiology

## 2017-02-17 NOTE — Telephone Encounter (Signed)
Advised patient that Dr. Elberta Fortisamnitz reviewed and doesn't think issues/symptoms are r/t heart rhythm.  Patient then tells me that she went to her PCP yesterday and was diagnosed w/ sinusitis and this was the problem.  She appreciates the call but wishes it had been sooner.   She did tell me that she very disappointed in our office though.  I spent 20 minutes listening to her about her sleep study issues and how she has had to "chase us down for answers".  She was told she needed CPAP and then told she didn't.  She is very upset about how all of this has been handled.  Informed patient that I would speak with management and ask them to call her to discuss further.  She is agreeable and looks forward to talking with someone about "this horrible experience".

## 2017-02-18 NOTE — Telephone Encounter (Signed)
Reached back out to the patient to informed her that our pre-cert department is going to resubmit her CPAP titration on Monday 02/21/17. Patient understands that if she has not heard back from our office within 2 weeks to call us back to inquire about the resubmission of her test. Patient thanked me for calling again.

## 2017-02-18 NOTE — Telephone Encounter (Signed)
Patient called inquiring about her CPAP titration and after speaking with Amy in pre-cert I learned the titration has been denied by patients insurance. Reached out to the patient to relay the information and was informed by the patient that she contacted her   insurance carrier and was told her titration was denied because of lack of information. Informed patient I would contact our Pre-cert department and get back to her. Patient was grateful for the call and thanked me.

## 2017-02-22 ENCOUNTER — Observation Stay (HOSPITAL_COMMUNITY)
Admission: EM | Admit: 2017-02-22 | Discharge: 2017-02-24 | Disposition: A | Discharge status: Home / Self Care | Payer: 59 | Attending: Nephrology | Admitting: Nephrology

## 2017-02-22 ENCOUNTER — Other Ambulatory Visit: Payer: Self-pay

## 2017-02-22 ENCOUNTER — Encounter (HOSPITAL_COMMUNITY): Payer: Self-pay | Admitting: Emergency Medicine

## 2017-02-22 ENCOUNTER — Emergency Department (HOSPITAL_COMMUNITY): Payer: 59

## 2017-02-22 DIAGNOSIS — K31811 Angiodysplasia of stomach and duodenum with bleeding: Principal | ICD-10-CM | POA: Insufficient documentation

## 2017-02-22 DIAGNOSIS — D649 Anemia, unspecified: Secondary | ICD-10-CM

## 2017-02-22 DIAGNOSIS — I10 Essential (primary) hypertension: Secondary | ICD-10-CM | POA: Diagnosis not present

## 2017-02-22 DIAGNOSIS — Z8249 Family history of ischemic heart disease and other diseases of the circulatory system: Secondary | ICD-10-CM | POA: Diagnosis not present

## 2017-02-22 DIAGNOSIS — I480 Paroxysmal atrial fibrillation: Secondary | ICD-10-CM | POA: Diagnosis not present

## 2017-02-22 DIAGNOSIS — R0602 Shortness of breath: Secondary | ICD-10-CM | POA: Diagnosis not present

## 2017-02-22 DIAGNOSIS — Z87891 Personal history of nicotine dependence: Secondary | ICD-10-CM | POA: Diagnosis not present

## 2017-02-22 DIAGNOSIS — D509 Iron deficiency anemia, unspecified: Secondary | ICD-10-CM | POA: Diagnosis not present

## 2017-02-22 DIAGNOSIS — K5730 Diverticulosis of large intestine without perforation or abscess without bleeding: Secondary | ICD-10-CM | POA: Diagnosis not present

## 2017-02-22 DIAGNOSIS — D62 Acute posthemorrhagic anemia: Secondary | ICD-10-CM | POA: Diagnosis not present

## 2017-02-22 DIAGNOSIS — Z888 Allergy status to other drugs, medicaments and biological substances status: Secondary | ICD-10-CM | POA: Diagnosis not present

## 2017-02-22 DIAGNOSIS — Z7901 Long term (current) use of anticoagulants: Secondary | ICD-10-CM | POA: Insufficient documentation

## 2017-02-22 DIAGNOSIS — J329 Chronic sinusitis, unspecified: Secondary | ICD-10-CM | POA: Diagnosis not present

## 2017-02-22 DIAGNOSIS — Z6835 Body mass index (BMI) 35.0-35.9, adult: Secondary | ICD-10-CM | POA: Diagnosis not present

## 2017-02-22 DIAGNOSIS — K219 Gastro-esophageal reflux disease without esophagitis: Secondary | ICD-10-CM | POA: Insufficient documentation

## 2017-02-22 DIAGNOSIS — I4892 Unspecified atrial flutter: Secondary | ICD-10-CM | POA: Insufficient documentation

## 2017-02-22 DIAGNOSIS — Z79899 Other long term (current) drug therapy: Secondary | ICD-10-CM | POA: Diagnosis not present

## 2017-02-22 HISTORY — DX: Personal history of other diseases of the circulatory system: Z86.79

## 2017-02-22 LAB — CBC
HEMATOCRIT: 24.1 % — AB (ref 36.0–46.0)
Hemoglobin: 7.4 g/dL — ABNORMAL LOW (ref 12.0–15.0)
MCH: 25.7 pg — AB (ref 26.0–34.0)
MCHC: 30.7 g/dL (ref 30.0–36.0)
MCV: 83.7 fL (ref 78.0–100.0)
Platelets: 380 10*3/uL (ref 150–400)
RBC: 2.88 MIL/uL — ABNORMAL LOW (ref 3.87–5.11)
RDW: 15.6 % — AB (ref 11.5–15.5)
WBC: 11.3 10*3/uL — ABNORMAL HIGH (ref 4.0–10.5)

## 2017-02-22 LAB — ABO/RH: ABO/RH(D): O POS

## 2017-02-22 LAB — FOLATE: FOLATE: 13.5 ng/mL (ref >5.9)

## 2017-02-22 LAB — BASIC METABOLIC PANEL
Anion gap: 7 (ref 5–15)
BUN: 15 mg/dL (ref 6–20)
CHLORIDE: 106 mmol/L (ref 101–111)
CO2: 26 mmol/L (ref 22–32)
Calcium: 9.2 mg/dL (ref 8.9–10.3)
Creatinine, Ser: 0.92 mg/dL (ref 0.44–1.00)
GFR calc Af Amer: >60 mL/min (ref >60)
GFR calc non Af Amer: >60 mL/min (ref >60)
GLUCOSE: 110 mg/dL — AB (ref 65–99)
POTASSIUM: 3.8 mmol/L (ref 3.5–5.1)
Sodium: 139 mmol/L (ref 135–145)

## 2017-02-22 LAB — I-STAT TROPONIN, ED: Troponin i, poc: 0 ng/mL (ref 0.00–0.08)

## 2017-02-22 LAB — VITAMIN B12: Vitamin B-12: 744 pg/mL (ref 180–914)

## 2017-02-22 LAB — POC OCCULT BLOOD, ED: Fecal Occult Bld: POSITIVE — AB

## 2017-02-22 LAB — IRON AND TIBC
IRON: 10 ug/dL — AB (ref 28–170)
Saturation Ratios: 2 % — ABNORMAL LOW (ref 10.4–31.8)
TIBC: 405 ug/dL (ref 250–450)
UIBC: 395 ug/dL

## 2017-02-22 LAB — APTT: aPTT: 37 seconds — ABNORMAL HIGH (ref 24–36)

## 2017-02-22 LAB — PREPARE RBC (CROSSMATCH)

## 2017-02-22 LAB — RETICULOCYTES
RBC.: 2.81 MIL/uL — ABNORMAL LOW (ref 3.87–5.11)
RETIC COUNT ABSOLUTE: 53.4 10*3/uL (ref 19.0–186.0)
Retic Ct Pct: 1.9 % (ref 0.4–3.1)

## 2017-02-22 LAB — FERRITIN: FERRITIN: 4 ng/mL — AB (ref 11–307)

## 2017-02-22 LAB — PROTIME-INR
INR: 1.33
PROTHROMBIN TIME: 16.4 s — AB (ref 11.4–15.2)

## 2017-02-22 MED ORDER — SODIUM CHLORIDE 0.9 % IV SOLN
10.0000 mL/h | Freq: Once | INTRAVENOUS | Status: AC
  Administered 2017-02-22: 10 mL/h via INTRAVENOUS

## 2017-02-22 MED ORDER — ONDANSETRON HCL 4 MG PO TABS: 4.0000 mg | ORAL_TABLET | Freq: Four times a day (QID) | ORAL | Status: DC | PRN

## 2017-02-22 MED ORDER — AMOXICILLIN-POT CLAVULANATE 875-125 MG PO TABS
1.0000 | ORAL_TABLET | Freq: Two times a day (BID) | ORAL | Status: DC
  Administered 2017-02-23 – 2017-02-24 (×4): 1 via ORAL
  Filled 2017-02-22 (×5): qty 1

## 2017-02-22 MED ORDER — ONDANSETRON HCL 4 MG/2ML IJ SOLN: 4.0000 mg | Freq: Four times a day (QID) | INTRAMUSCULAR | Status: DC | PRN

## 2017-02-22 MED ORDER — ACETAMINOPHEN 650 MG RE SUPP: 650.0000 mg | Freq: Four times a day (QID) | RECTAL | Status: DC | PRN

## 2017-02-22 MED ORDER — HYDRALAZINE HCL 20 MG/ML IJ SOLN: 10.0000 mg | Freq: Three times a day (TID) | INTRAMUSCULAR | Status: DC | PRN

## 2017-02-22 MED ORDER — PANTOPRAZOLE SODIUM 40 MG IV SOLR: 40.0000 mg | Freq: Two times a day (BID) | INTRAVENOUS | Status: DC

## 2017-02-22 MED ORDER — ACETAMINOPHEN 325 MG PO TABS: 650.0000 mg | ORAL_TABLET | Freq: Four times a day (QID) | ORAL | Status: DC | PRN

## 2017-02-22 MED ORDER — PANTOPRAZOLE SODIUM 40 MG IV SOLR
40.0000 mg | Freq: Two times a day (BID) | INTRAVENOUS | Status: DC
  Administered 2017-02-23 – 2017-02-24 (×4): 40 mg via INTRAVENOUS
  Filled 2017-02-22 (×4): qty 40

## 2017-02-22 NOTE — ED Triage Notes (Signed)
Onset 1-2 weeks developed nasal congestion and overtime shortness of breath. Seen Doctor given nasal spray and antibiotics. Two days ago developed headache and currently does not have a headache or chest pain. Patient seen at an urgent care today and sent to the ED for evaluation.

## 2017-02-22 NOTE — H&P (Signed)
[facility] History and Physical  Mary ServeSandra J Purrington AVW:[id number] DOB: 03/22/1950 DOA: 02/22/2017  Referring physician:  PCP: Patient, No Pcp Per   Chief Complaint: "I felt short of breath."  HPI: Mary Poole is a 67 y.o. female with past medical history significant for reflux, atrial flutter, chronic anticoagulation on Xarelto and hypertension presents emergency room with complaint of shortness of breath.  Patient states that she felt weak and congested.  Went to urgent care and got treated for sinus infection.  After 4 days of taking Augmentin and no results patient went back for reevaluation.  Requested EKG and blood work.  After the workup was done at urgent care they urged her to go to the emergency room for definitive diagnosis.  Patient drove herself to the emergency room.  ED course: Patient complaining of dizziness.  Hemoglobin 7.4.  2 units of PRBCs ordered.  Hospitalist consulted for admission.   Review of Systems:  As per HPI otherwise 10 point review of systems negative.    Past Medical History:  Diagnosis Date  . Abnormal heart rhythm   . GERD (gastroesophageal reflux disease)   . Hypertension   . Migraine    Past Surgical History:  Procedure Laterality Date  . HERNIA REPAIR    . WISDOM TOOTH EXTRACTION     Social History:  reports that  has never smoked. she has never used smokeless tobacco. She reports that she does not drink alcohol or use drugs.  Allergies  Allergen Reactions  . Diphenhydramine Hcl Other (See Comments)    Family History  Problem Relation Age of Onset  . CAD Father   . AAA (abdominal aortic aneurysm) Sister   . Thyroid disease Sister      Prior to Admission medications   Medication Sig Start Date End Date Taking? Authorizing Provider  amoxicillin-clavulanate (AUGMENTIN) 875-125 MG tablet Take 1 tablet by mouth 2 (two) times daily with a meal. x7 days 02/16/17  Yes [provider]  carvedilol (COREG) 12.5 MG tablet Take 1  tablet (12.5 mg total) by mouth 2 (two) times daily. 01/20/17 04/20/17 Yes Camnitz, Will Daphine DeutscherMartin, MD  fluticasone (FLONASE) 50 MCG/ACT nasal spray Place 1 spray into both nostrils 2 (two) times daily. 02/16/17  Yes [provider]  omeprazole (PRILOSEC) 20 MG capsule Take 20 mg by mouth daily. 05/31/16  Yes [provider]  rivaroxaban (XARELTO) 20 MG TABS tablet Take 1 tablet (20 mg total) by mouth daily. 10/15/16  Yes Lyn RecordsSmith, Henry W, MD   Physical Exam: Vitals:   02/22/17 2055 02/22/17 2057 02/22/17 2106 02/22/17 2113  BP:   (!) 144/68 (!) 146/65  Pulse:   82 84  Resp:  18 16 18   Temp: 98.1 F (36.7 C)  98.6 F (37 C) 98.2 F (36.8 C)  TempSrc: Oral Oral Oral Oral  SpO2:   100% 100%    Wt Readings from Last 3 Encounters:  01/21/17 98 kg (216 lb)  10/20/16 97.8 kg (215 lb 9.6 oz)  09/17/16 98.4 kg (217 lb)    General:  Appears calm and comfortable; A&Ox3 Eyes:  PERRL, EOMI, normal lids, iris ENT:  grossly normal hearing, lips & tongue Neck:  no LAD, masses or thyromegaly Cardiovascular:  RRR, no m/r/g. No LE edema.  Respiratory:  CTA bilaterally, no w/r/r. Normal respiratory effort. Abdomen:  soft, ntnd; DRE by EDP Skin:  no rash or induration seen on limited exam Musculoskeletal:  grossly normal tone BUE/BLE Psychiatric:  grossly normal mood and  affect, speech fluent and appropriate Neurologic:  CN 2-12 grossly intact, moves all extremities in coordinated fashion.          Labs on Admission:  Basic Metabolic Panel: Recent Labs  Lab 02/22/17 1146  NA 139  K 3.8  CL 106  CO2 26  GLUCOSE 110*  BUN 15  CREATININE 0.92  CALCIUM 9.2   Liver Function Tests: No results for input(s): AST, ALT, ALKPHOS, BILITOT, PROT, ALBUMIN in the last 168 hours. No results for input(s): LIPASE, AMYLASE in the last 168 hours. No results for input(s): AMMONIA in the last 168 hours. CBC: Recent Labs  Lab 02/22/17 1146  WBC 11.3*  HGB 7.4*  HCT 24.1*  MCV 83.7  PLT  380   Cardiac Enzymes: No results for input(s): CKTOTAL, CKMB, CKMBINDEX, TROPONINI in the last 168 hours.  BNP (last 3 results) No results for input(s): BNP in the last 8760 hours.  ProBNP (last 3 results) No results for input(s): PROBNP in the last 8760 hours.   Creatinine clearance cannot be calculated (Unknown ideal weight.)  CBG: No results for input(s): GLUCAP in the last 168 hours.  Radiological Exams on Admission: Dg Chest 2 View  Result Date: 02/22/2017 CLINICAL DATA:  Shortness of breath. EXAM: CHEST  2 VIEW COMPARISON:  None. FINDINGS: The heart size and mediastinal contours are within normal limits. Both lungs are clear. No pneumothorax or pleural effusion is noted. The visualized skeletal structures are unremarkable. IMPRESSION: No active cardiopulmonary disease. Electronically Signed   By: Lupita Raider, M.D.   On: 02/22/2017 12:08    EKG: Independently reviewed. NSR. No stemi,  Assessment/Plan Active Problems:   Symptomatic anemia   BRBPR Most recent hemoglobin No History of anemia Serial H&H IV fluids History of bleed none GI consult not ordered Avoid NSAIDs Hold the following medications xarelto   Abn heart rhythm (flutter) Cont Coreg Hold xarelto  Allergies Cont flonase  Code Status: FC  DVT Prophylaxis: SCDs Family Communication: none available Disposition Plan: Pending Improvement  Status: tele, obs  Haydee Salter, MD Family Medicine [facility] [email or web address] Password [email or web address]

## 2017-02-22 NOTE — ED Notes (Signed)
Pt ambulated to restroom. Did not complain of having SOB.

## 2017-02-22 NOTE — ED Provider Notes (Signed)
[facility] Provider Note   CSN: [id number] Arrival date & time: 02/22/17  1127     History   Chief Complaint Chief Complaint  Patient presents with  . Shortness of Breath    HPI Mary Poole is a 67 y.o. female.  HPI   Patient is a 67 year old female with a history of atrial flutter (on Xarelto and carvedilol) and  GERD presenting for shortness of breath.  Patient reports that one week ago she was diagnosed with a sinus infection at urgent care and given amoxicillin and flonase.  Subsequently, patient reports she has had minimal change in her congestion, cough, and rhinorrhea.  Patient reports that over this interval, she has also noted increasing shortness of breath with exertion.  Patient also reports she has been dizzy with ambulation, and has had pulsatile tinnitus.  Patient reports she does have a history of pulsatile tinnitus that has been evaluated by ear nose and throat physician, however it is been worse recently.  Patient denies any fever, chills, chest pain, nausea, vomiting, abdominal pain, dark or tarry stools, hematochezia, or vaginal bleeding.  Patient reports she is a remote history of anemia, however nothing recently.  Patient currently does not have a primary care provider.  Patient has never had a colonoscopy.  Past Medical History:  Diagnosis Date  . Abnormal heart rhythm   . GERD (gastroesophageal reflux disease)   . Hypertension   . Migraine     Patient Active Problem List   Diagnosis Date Noted  . Chest pressure 09/17/2016  . Accessory atrioventricular pathway 09/17/2016  . On continuous oral anticoagulation 09/17/2016  . Snoring 09/17/2016  . Paroxysmal atrial fibrillation (HCC) 09/16/2016  . Essential hypertension 07/19/2016  . Sensorineural hearing loss (SNHL), bilateral 11/20/2015  . GERD (gastroesophageal reflux disease) 07/15/2011    Past Surgical History:  Procedure Laterality Date  . HERNIA REPAIR      . WISDOM TOOTH EXTRACTION      OB History    No data available       Home Medications    Prior to Admission medications   Medication Sig Start Date End Date Taking? Authorizing Provider  amoxicillin-clavulanate (AUGMENTIN) 875-125 MG tablet Take 1 tablet by mouth 2 (two) times daily with a meal. x7 days 02/16/17  Yes [provider]  carvedilol (COREG) 12.5 MG tablet Take 1 tablet (12.5 mg total) by mouth 2 (two) times daily. 01/20/17 04/20/17 Yes Camnitz, Will Daphine DeutscherMartin, MD  fluticasone (FLONASE) 50 MCG/ACT nasal spray Place 1 spray into both nostrils 2 (two) times daily. 02/16/17  Yes [provider]  omeprazole (PRILOSEC) 20 MG capsule Take 20 mg by mouth daily. 05/31/16  Yes [provider]  rivaroxaban (XARELTO) 20 MG TABS tablet Take 1 tablet (20 mg total) by mouth daily. 10/15/16  Yes Lyn RecordsSmith, Henry W, MD    Family History Family History  Problem Relation Age of Onset  . CAD Father   . AAA (abdominal aortic aneurysm) Sister   . Thyroid disease Sister     Social History Social History   Tobacco Use  . Smoking status: Never Smoker  . Smokeless tobacco: Never Used  Substance Use Topics  . Alcohol use: No  . Drug use: No     Allergies   Diphenhydramine hcl   Review of Systems Review of Systems  Constitutional: Negative for chills and fever.  HENT: Positive for congestion, rhinorrhea and sinus pain. Negative for voice change.   Respiratory: Positive  for cough and shortness of breath. Negative for chest tightness.   Cardiovascular: Negative for chest pain and palpitations.  Gastrointestinal: Negative for abdominal pain, anal bleeding, blood in stool, nausea and vomiting.  Genitourinary: Negative for dysuria and vaginal bleeding.  Skin: Negative for color change.  All other systems reviewed and are negative.    Physical Exam Updated Vital Signs BP (!) 142/83   Pulse 77   Temp 97.9 F (36.6 C) (Oral)   Resp 18   SpO2 100%   Physical  Exam  Constitutional: She appears well-developed and well-nourished. No distress.  HENT:  Head: Normocephalic and atraumatic.  Mouth/Throat: Oropharynx is clear and moist.  Eyes: Conjunctivae and EOM are normal. Pupils are equal, round, and reactive to light.  Neck: Normal range of motion. Neck supple.  Cardiovascular: Normal rate, regular rhythm, S1 normal and S2 normal.  No murmur heard. Intact distal pulses.   Pulmonary/Chest: Effort normal and breath sounds normal. She has no wheezes. She has no rales.  Abdominal: Soft. She exhibits no distension. There is no tenderness. There is no guarding.  Genitourinary:  Genitourinary Comments: Exam performed with nurse, Lanora Manis, chaperone present.  There are no external lesions of the anal verge or rectum.  No thrombosed external hemorrhoids.  No internal hemorrhoids palpated.  Small volume of loose stool present in rectal vault.  Stool is brown in color.  Normal rectal tone.  Musculoskeletal: Normal range of motion. She exhibits no edema or deformity.  Lymphadenopathy:    She has no cervical adenopathy.  Neurological: She is alert.  Cranial nerves grossly intact. Patient moves extremities symmetrically and with good coordination.  Skin: Skin is warm and dry. No rash noted. No erythema.  Psychiatric: She has a normal mood and affect. Her behavior is normal. Judgment and thought content normal.  Nursing note and vitals reviewed.    ED Treatments / Results  Labs (all labs ordered are listed, but only abnormal results are displayed) Labs Reviewed  BASIC METABOLIC PANEL - Abnormal; Notable for the following components:      Result Value   Glucose, Bld 110 (*)    All other components within normal limits  CBC - Abnormal; Notable for the following components:   WBC 11.3 (*)    RBC 2.88 (*)    Hemoglobin 7.4 (*)    HCT 24.1 (*)    MCH 25.7 (*)    RDW 15.6 (*)    All other components within normal limits  PROTIME-INR - Abnormal; Notable  for the following components:   Prothrombin Time 16.4 (*)    All other components within normal limits  APTT - Abnormal; Notable for the following components:   aPTT 37 (*)    All other components within normal limits  POC OCCULT BLOOD, ED - Abnormal; Notable for the following components:   Fecal Occult Bld POSITIVE (*)    All other components within normal limits  I-STAT TROPONIN, ED  TYPE AND SCREEN  PREPARE RBC (CROSSMATCH)    EKG  EKG Interpretation  Date/Time:  Tuesday February 22 2017 11:36:36 EST Ventricular Rate:  82 PR Interval:  112 QRS Duration: 82 QT Interval:  386 QTC Calculation: 450 R Axis:   58 Text Interpretation:  Normal sinus rhythm Nonspecific ST abnormality Abnormal ECG agree. no STEMI. no old comparison Confirmed by Arby Barrette [phone]) on 02/22/2017 6:19:44 PM       Radiology Dg Chest 2 View  Result Date: 02/22/2017 CLINICAL DATA:  Shortness of breath. EXAM:  CHEST  2 VIEW COMPARISON:  None. FINDINGS: The heart size and mediastinal contours are within normal limits. Both lungs are clear. No pneumothorax or pleural effusion is noted. The visualized skeletal structures are unremarkable. IMPRESSION: No active cardiopulmonary disease. Electronically Signed   By: Lupita Raider, M.D.   On: 02/22/2017 12:08    Procedures Procedures (including critical care time)  CRITICAL CARE Performed by: Elisha Ponder   Total critical care time: 35 minutes. For hemoglobin < 8 with symptoms requiring PRBC transfusion.  Critical care time was exclusive of separately billable procedures and treating other patients.  Critical care was necessary to treat or prevent imminent or life-threatening deterioration.  Critical care was time spent personally by me on the following activities: development of treatment plan with patient and/or surrogate as well as nursing, discussions with consultants, evaluation of patient's response to treatment, examination of patient,  obtaining history from patient or surrogate, ordering and performing treatments and interventions, ordering and review of laboratory studies, ordering and review of radiographic studies, pulse oximetry and re-evaluation of patient's condition.   Medications Ordered in ED Medications  0.9 %  sodium chloride infusion (not administered)     Initial Impression / Assessment and Plan / ED Course  I have reviewed the triage vital signs and the nursing notes.  Pertinent labs & imaging results that were available during my care of the patient were reviewed by me and considered in my medical decision making (see chart for details).     Final Clinical Impressions(s) / ED Diagnoses   Final diagnoses:  Symptomatic anemia  Normocytic anemia  Shortness of breath   Patient is nontoxic-appearing, hemodynamically stable, in no acute distress.  Patient does have a occult positive stool result.  Other than anemia, no other laboratory abnormalities.  Chest x-ray is clear of any infiltrate.  I suspect that patient has bronchitis as cause of her cough, however the shortness of breath with exertion, dizziness, tinnitus are likely due to hemoglobin of 7.4.  This appears to be new, as patient denies any history of anemia.  Will admit for transfusion and symptomatic anemia.  2 units of PRBCs initiated in emergency department.  Risks and benefits were discussed with the patient personally by me.  Spoke with Dr. Melynda Ripple who admit the patient for symptomatic anemia, transfusion, and workup of occult blood in GI tract.  This is a shared visit with Dr. Arby Barrette. Patient was independently evaluated by this attending physician. Attending physician consulted in evaluation and admission management.   ED Discharge Orders    None       Delia Chimes 02/22/17 2226    Aviva Kluver B, PA-C 02/22/17 2229    Arby Barrette, MD 02/25/17 1429

## 2017-02-22 NOTE — ED Provider Notes (Signed)
Medical screening examination/treatment/procedure(s) were conducted as a shared visit with non-physician practitioner(s) and myself.  I personally evaluated the patient during the encounter.   EKG Interpretation  Date/Time:  Tuesday February 22 2017 11:36:36 EST Ventricular Rate:  82 PR Interval:  112 QRS Duration: 82 QT Interval:  386 QTC Calculation: 450 R Axis:   58 Text Interpretation:  Normal sinus rhythm Nonspecific ST abnormality Abnormal ECG agree. no STEMI. no old comparison Confirmed by Arby BarrettePfeiffer, Tavares Levinson [phone]([id number]) on 02/22/2017 6:19:44 PM        Arby BarrettePfeiffer, Ariatna Jester, MD 02/25/17 1422

## 2017-02-22 NOTE — ED Notes (Signed)
Admitting MD at bedside.

## 2017-02-23 ENCOUNTER — Other Ambulatory Visit: Payer: Self-pay

## 2017-02-23 DIAGNOSIS — D649 Anemia, unspecified: Secondary | ICD-10-CM | POA: Diagnosis not present

## 2017-02-23 DIAGNOSIS — R0602 Shortness of breath: Secondary | ICD-10-CM | POA: Diagnosis not present

## 2017-02-23 DIAGNOSIS — D500 Iron deficiency anemia secondary to blood loss (chronic): Secondary | ICD-10-CM | POA: Diagnosis not present

## 2017-02-23 DIAGNOSIS — R195 Other fecal abnormalities: Secondary | ICD-10-CM | POA: Diagnosis not present

## 2017-02-23 DIAGNOSIS — K31811 Angiodysplasia of stomach and duodenum with bleeding: Secondary | ICD-10-CM | POA: Diagnosis not present

## 2017-02-23 LAB — BASIC METABOLIC PANEL
ANION GAP: 9 (ref 5–15)
BUN: 11 mg/dL (ref 6–20)
CO2: 24 mmol/L (ref 22–32)
Calcium: 9 mg/dL (ref 8.9–10.3)
Chloride: 107 mmol/L (ref 101–111)
Creatinine, Ser: 0.81 mg/dL (ref 0.44–1.00)
GFR calc Af Amer: >60 mL/min (ref >60)
GFR calc non Af Amer: >60 mL/min (ref >60)
GLUCOSE: 101 mg/dL — AB (ref 65–99)
POTASSIUM: 3.8 mmol/L (ref 3.5–5.1)
Sodium: 140 mmol/L (ref 135–145)

## 2017-02-23 LAB — HEMOGLOBIN AND HEMATOCRIT, BLOOD
HCT: 31.2 % — ABNORMAL LOW (ref 36.0–46.0)
Hemoglobin: 10.1 g/dL — ABNORMAL LOW (ref 12.0–15.0)

## 2017-02-23 MED ORDER — SODIUM CHLORIDE 0.9 % IV SOLN
125.0000 mg | Freq: Every day | INTRAVENOUS | Status: AC
  Administered 2017-02-23 – 2017-02-24 (×2): 125 mg via INTRAVENOUS
  Filled 2017-02-23 (×3): qty 10

## 2017-02-23 MED ORDER — CARVEDILOL 12.5 MG PO TABS
12.5000 mg | ORAL_TABLET | Freq: Two times a day (BID) | ORAL | Status: DC
  Administered 2017-02-23 – 2017-02-24 (×3): 12.5 mg via ORAL
  Filled 2017-02-23 (×3): qty 1

## 2017-02-23 MED ORDER — PEG 3350-KCL-NA BICARB-NACL 420 G PO SOLR
4000.0000 mL | Freq: Once | ORAL | Status: AC
  Administered 2017-02-23: 4000 mL via ORAL
  Filled 2017-02-23: qty 4000

## 2017-02-23 NOTE — ED Notes (Signed)
Assisted patient up to Bathroom, steady gait. No complaints at present. No sob when walking to bathroom.

## 2017-02-23 NOTE — Telephone Encounter (Signed)
Reached out to patient to inform her that her sleep study is being resubmitted for afib and our office will contact her with the new information once it comes back.

## 2017-02-23 NOTE — H&P (View-Only) (Signed)
Unassigned patient Reason for Consult:  Anemia and guaiac-positive stools Referring Physician: [facility]  Mary Poole is an 67 y.o. female.  HPI: 2 -year-old black female with a history of atrial fibrillation on Xarelto, gives a history of any sinus infection,  went to an urgent care and [address] to get an antibiotic for her sinus infection. Patient was advised that her dizziness and weakness was from her low hemoglobin and was found to be at 7.4 g/dL. She was found to have occult blood in the stool and was therefore asked to come to the hospital for further evaluation. She denies having any melena or hematochezia appetite is good and her weight's been stable. She been taking Prilosec for the last couple of years for reflux. She denies having any dysphagia or odynophagia. There is no history of ulcers jaundice or colitis. She's never had a colonoscopy. She denies a family history of colon cancer celiac sprue. She has 2-3 bowel movements per day with no obvious blood or mucus in the stool. Her last dose of Xarelto was over 48 hours ago. Patient has received 2 units of packed red blood cells since admission hemoglobin is up to 10.1 g/dL her iron is low at 10 with a ferritin of 4 folate level 13.5.  Anemia Past Medical History:  Diagnosis Date  . GERD (gastroesophageal reflux disease)   . H/O atrial flutter   . Hypertension   . Migraine    Past Surgical History:  Procedure Laterality Date  . HERNIA REPAIR    . WISDOM TOOTH EXTRACTION     Family History  Problem Relation Age of Onset  . CAD Father   . AAA (abdominal aortic aneurysm) Sister   . Thyroid disease Sister    Social History:  reports that she has quit smoking. she has never used smokeless tobacco. She reports that she does not drink alcohol or use drugs.  Allergies:  Allergies  Allergen Reactions  . Diphenhydramine Hcl Other (See Comments)   Medications: I have reviewed the patient's current  medications.  Results for orders placed or performed during the hospital encounter of 02/22/17 (from the past 48 hour(s))  Basic metabolic panel     Status: Abnormal   Collection Time: 02/22/17 11:46 AM  Result Value Ref Range   Sodium 139 135 - 145 mmol/L   Potassium 3.8 3.5 - 5.1 mmol/L   Chloride 106 101 - 111 mmol/L   CO2 26 22 - 32 mmol/L   Glucose, Bld 110 (H) 65 - 99 mg/dL   BUN 15 6 - 20 mg/dL   Creatinine, Ser 0.92 0.44 - 1.00 mg/dL   Calcium 9.2 8.9 - 10.3 mg/dL   GFR calc non Af Amer >60 >60 mL/min   GFR calc Af Amer >60 >60 mL/min    Comment: (NOTE) The eGFR has been calculated using the CKD EPI equation. This calculation has not been validated in all clinical situations. eGFR's persistently <60 mL/min signify possible Chronic Kidney Disease.    Anion gap 7 5 - 15  CBC     Status: Abnormal   Collection Time: 02/22/17 11:46 AM  Result Value Ref Range   WBC 11.3 (H) 4.0 - 10.5 K/uL   RBC 2.88 (L) 3.87 - 5.11 MIL/uL   Hemoglobin 7.4 (L) 12.0 - 15.0 g/dL   HCT 24.1 (L) 36.0 - 46.0 %   MCV 83.7 78.0 - 100.0 fL   MCH 25.7 (L) 26.0 - 34.0 pg   MCHC 30.7  30.0 - 36.0 g/dL   RDW 15.6 (H) 11.5 - 15.5 %   Platelets 380 150 - 400 K/uL  I-stat troponin, ED     Status: None   Collection Time: 02/22/17 11:52 AM  Result Value Ref Range   Troponin i, poc 0.00 0.00 - 0.08 ng/mL   Comment 3            Comment: Due to the release kinetics of cTnI, a negative result within the first hours of the onset of symptoms does not rule out myocardial infarction with certainty. If myocardial infarction is still suspected, repeat the test at appropriate intervals.   POC occult blood, ED Provider will collect     Status: Abnormal   Collection Time: 02/22/17  5:56 PM  Result Value Ref Range   Fecal Occult Bld POSITIVE (A) NEGATIVE  Prepare RBC     Status: None   Collection Time: 02/22/17  7:29 PM  Result Value Ref Range   Order Confirmation ORDER PROCESSED BY BLOOD BANK   Type and  screen [place]     Status: None (Preliminary result)   Collection Time: 02/22/17  7:36 PM  Result Value Ref Range   ABO/RH(D) O POS    Antibody Screen NEG    Sample Expiration 02/25/2017    Unit Number [id number]    Blood Component Type RED CELLS,LR    Unit division 00    Status of Unit ISSUED,FINAL    Transfusion Status OK TO TRANSFUSE    Crossmatch Result Compatible    Unit Number [id number]    Blood Component Type RED CELLS,LR    Unit division 00    Status of Unit ISSUED    Transfusion Status OK TO TRANSFUSE    Crossmatch Result Compatible   Protime-INR     Status: Abnormal   Collection Time: 02/22/17  7:36 PM  Result Value Ref Range   Prothrombin Time 16.4 (H) 11.4 - 15.2 seconds   INR 1.33   PTT     Status: Abnormal   Collection Time: 02/22/17  7:36 PM  Result Value Ref Range   aPTT 37 (H) 24 - 36 seconds    Comment:        IF BASELINE aPTT IS ELEVATED, SUGGEST PATIENT RISK ASSESSMENT BE USED TO DETERMINE APPROPRIATE ANTICOAGULANT THERAPY.   ABO/Rh     Status: None   Collection Time: 02/22/17  7:36 PM  Result Value Ref Range   ABO/RH(D) O POS   Vitamin B12     Status: None   Collection Time: 02/22/17  8:51 PM  Result Value Ref Range   Vitamin B-12 744 180 - 914 pg/mL    Comment: (NOTE) This assay is not validated for testing neonatal or myeloproliferative syndrome specimens for Vitamin B12 levels.   Folate     Status: None   Collection Time: 02/22/17  8:51 PM  Result Value Ref Range   Folate 13.5 >5.9 ng/mL  Iron and TIBC     Status: Abnormal   Collection Time: 02/22/17  8:51 PM  Result Value Ref Range   Iron 10 (L) 28 - 170 ug/dL   TIBC 405 250 - 450 ug/dL   Saturation Ratios 2 (L) 10.4 - 31.8 %   UIBC 395 ug/dL  Ferritin     Status: Abnormal   Collection Time: 02/22/17  8:51 PM  Result Value Ref Range   Ferritin 4 (L) 11 - 307 ng/mL  Reticulocytes     Status: Abnormal  Collection Time: 02/22/17  8:51 PM  Result Value Ref  Range   Retic Ct Pct 1.9 0.4 - 3.1 %   RBC. 2.81 (L) 3.87 - 5.11 MIL/uL   Retic Count, Absolute 53.4 19.0 - 186.0 K/uL  Hemoglobin and hematocrit, blood     Status: Abnormal   Collection Time: 02/23/17  6:19 AM  Result Value Ref Range   Hemoglobin 10.1 (L) 12.0 - 15.0 g/dL    Comment: POST TRANSFUSION SPECIMEN   HCT 31.2 (L) 36.0 - 70.1 %  Basic metabolic panel     Status: Abnormal   Collection Time: 02/23/17  6:19 AM  Result Value Ref Range   Sodium 140 135 - 145 mmol/L   Potassium 3.8 3.5 - 5.1 mmol/L   Chloride 107 101 - 111 mmol/L   CO2 24 22 - 32 mmol/L   Glucose, Bld 101 (H) 65 - 99 mg/dL   BUN 11 6 - 20 mg/dL   Creatinine, Ser 0.81 0.44 - 1.00 mg/dL   Calcium 9.0 8.9 - 10.3 mg/dL   GFR calc non Af Amer >60 >60 mL/min   GFR calc Af Amer >60 >60 mL/min    Comment: (NOTE) The eGFR has been calculated using the CKD EPI equation. This calculation has not been validated in all clinical situations. eGFR's persistently <60 mL/min signify possible Chronic Kidney Disease.    Anion gap 9 5 - 15    Dg Chest 2 View  Result Date: 02/22/2017 CLINICAL DATA:  Shortness of breath. EXAM: CHEST  2 VIEW COMPARISON:  None. FINDINGS: The heart size and mediastinal contours are within normal limits. Both lungs are clear. No pneumothorax or pleural effusion is noted. The visualized skeletal structures are unremarkable. IMPRESSION: No active cardiopulmonary disease. Electronically Signed   By: Marijo Conception, M.D.   On: 02/22/2017 12:08    Review of Systems  Constitutional: Positive for malaise/fatigue. Negative for chills, diaphoresis, fever and weight loss.  HENT: Negative.   Eyes: Negative.   Respiratory: Positive for sputum production and shortness of breath. Negative for cough, hemoptysis and wheezing.   Cardiovascular: Negative.   Gastrointestinal: Positive for blood in stool, heartburn and melena. Negative for abdominal pain, constipation, diarrhea, nausea and vomiting.   Genitourinary: Negative.   Musculoskeletal: Positive for joint pain.  Skin: Negative.   Neurological: Positive for weakness.  Endo/Heme/Allergies: Negative.   Psychiatric/Behavioral: Negative.    Blood pressure (!) 142/76, pulse 77, temperature 98.1 F (36.7 C), temperature source Oral, resp. rate 18, SpO2 98 %. Physical Exam  Constitutional: She is oriented to person, place, and time. She appears well-developed and well-nourished.  Morbidly obese  HENT:  Head: Normocephalic and atraumatic.  Eyes: Conjunctivae and EOM are normal. Pupils are equal, round, and reactive to light.  Neck: Normal range of motion. Neck supple.  Cardiovascular: Normal rate and regular rhythm.  Respiratory: Effort normal and breath sounds normal.  GI: Soft. Bowel sounds are normal. She exhibits no distension. There is no tenderness. There is no rebound and no guarding.  Musculoskeletal: Normal range of motion.  Neurological: She is alert and oriented to person, place, and time.  Skin: Skin is warm and dry.  Psychiatric: She has a normal mood and affect. Her behavior is normal. Judgment and thought content normal.   Assessment/Plan: 1) iron deficiency anemia with guaiac-positive stools/history of GERD: An EGD and colonoscopy planned for tomorrow prep orders have been written.  2) History of atrial fibrillation Xarelto which has been on hold since 02/21/2017.  3) Morbid obesity. Shahidah Nesbitt 02/23/2017, 1:40 PM

## 2017-02-23 NOTE — ED Notes (Signed)
Clear Liquid Diet was ordered for Lunch. 

## 2017-02-23 NOTE — ED Notes (Signed)
PT washing herself off at sink on monitor. No needs.

## 2017-02-23 NOTE — Progress Notes (Signed)
PROGRESS NOTE    Mary Poole  UJW:[id number] DOB: 01-22-1951 DOA: 02/22/2017 PCP: Patient, No Pcp Per   Brief Narrative: 67 year old female with history of acid reflux, paroxysmal atrial flutter on anticoagulation with Xarelto presented to the ER with complaint of shortness of breath.  Patient reportedly went to urgent care and got treatment for sinus infection with antibiotics.  Blood work of weight hemoglobin of 7.4.  Sent to the hospital for further evaluation.  Assessment & Plan:   #Symptomatic anemia/iron deficiency anemia due to GI bleed in the setting of chronic anticoagulation use: Exact site unspecified.  Fecal occult blood test positive.  Patient denies any dark stools or red blood.  She received 2 units of red blood cell transfusion.  Hemoglobin is stable today.  Currently on clear liquid diet.  I will start Protonix IV.  GI consult requested to Dr Loreta Ave forGI evaluation. -Iron stores low.  IV iron treatment.  Tomorrow. -Avoid NSAIDs -Hold Xarelto until GI evaluation.  # Paroxysmal atrial flutter: Resume Coreg.  Anticoagulation on hold.  DVT prophylaxis:SCD Code Status: Full code Family Communication: Discussed with the patient's husband and daughter at bedside in ER. Disposition Plan: Telemetry floor    Consultants:   GI  Procedures: None Antimicrobials: Augmentin  Subjective: Seen and examined at bedside.  Has weakness.  Denied nausea vomiting chest pain or shortness of breath.  Reported no blood in the stool.  Objective: Vitals:   02/23/17 0600 02/23/17 0615 02/23/17 0630 02/23/17 0847  BP: 122/72 (!) 155/92 (!) 162/86 (!) 142/76  Pulse: 83 75 77 77  Resp: 14 12 14 18   Temp:      TempSrc:      SpO2: 93% 99% 100% 98%    Intake/Output Summary (Last 24 hours) at 02/23/2017 1300 Last data filed at 02/23/2017 0200 Gross per 24 hour  Intake 960 ml  Output -  Net 960 ml   There were no vitals filed for this visit.  Examination:  General exam: Appears  calm and comfortable  Respiratory system: Clear to auscultation. Respiratory effort normal. No wheezing or crackle Cardiovascular system: S1 & S2 heard, RRR.  No pedal edema. Gastrointestinal system: Abdomen is nondistended, soft and nontender. Normal bowel sounds heard. Central nervous system: Alert and oriented. No focal neurological deficits. Extremities: Symmetric 5 x 5 power. Skin: No rashes, lesions or ulcers Psychiatry: Judgement and insight appear normal. Mood & affect appropriate.     Data Reviewed: I have personally reviewed following labs and imaging studies  CBC: Recent Labs  Lab 02/22/17 1146 02/23/17 0619  WBC 11.3*  --   HGB 7.4* 10.1*  HCT 24.1* 31.2*  MCV 83.7  --   PLT 380  --    Basic Metabolic Panel: Recent Labs  Lab 02/22/17 1146 02/23/17 0619  NA 139 140  K 3.8 3.8  CL 106 107  CO2 26 24  GLUCOSE 110* 101*  BUN 15 11  CREATININE 0.92 0.81  CALCIUM 9.2 9.0   GFR: CrCl cannot be calculated (Unknown ideal weight.). Liver Function Tests: No results for input(s): AST, ALT, ALKPHOS, BILITOT, PROT, ALBUMIN in the last 168 hours. No results for input(s): LIPASE, AMYLASE in the last 168 hours. No results for input(s): AMMONIA in the last 168 hours. Coagulation Profile: Recent Labs  Lab 02/22/17 1936  INR 1.33   Cardiac Enzymes: No results for input(s): CKTOTAL, CKMB, CKMBINDEX, TROPONINI in the last 168 hours. BNP (last 3 results) No results for input(s): PROBNP in the last  8760 hours. HbA1C: No results for input(s): HGBA1C in the last 72 hours. CBG: No results for input(s): GLUCAP in the last 168 hours. Lipid Profile: No results for input(s): CHOL, HDL, LDLCALC, TRIG, CHOLHDL, LDLDIRECT in the last 72 hours. Thyroid Function Tests: No results for input(s): TSH, T4TOTAL, FREET4, T3FREE, THYROIDAB in the last 72 hours. Anemia Panel: Recent Labs    02/22/17 2051  VITAMINB12 744  FOLATE 13.5  FERRITIN 4*  TIBC 405  IRON 10*  RETICCTPCT  1.9   Sepsis Labs: No results for input(s): PROCALCITON, LATICACIDVEN in the last 168 hours.  No results found for this or any previous visit (from the past 240 hour(s)).       Radiology Studies: Dg Chest 2 View  Result Date: 02/22/2017 CLINICAL DATA:  Shortness of breath. EXAM: CHEST  2 VIEW COMPARISON:  None. FINDINGS: The heart size and mediastinal contours are within normal limits. Both lungs are clear. No pneumothorax or pleural effusion is noted. The visualized skeletal structures are unremarkable. IMPRESSION: No active cardiopulmonary disease. Electronically Signed   By: Lupita RaiderJames  Green Jr, M.D.   On: 02/22/2017 12:08        Scheduled Meds: . amoxicillin-clavulanate  1 tablet Oral BID WC  . pantoprazole (PROTONIX) IV  40 mg Intravenous Q12H   Continuous Infusions: . ferric gluconate (FERRLECIT/NULECIT) IV       LOS: 1 day    Dron Jaynie CollinsPrasad Bhandari, MD [facility] Pager [phone]  If 7PM-7AM, please contact night-coverage [email or web address] Password [email or web address] 02/23/2017, 1:00 PM

## 2017-02-23 NOTE — ED Notes (Signed)
Dr. Mann at bedside.

## 2017-02-23 NOTE — Consult Note (Signed)
Unassigned patient Reason for Consult:  Anemia and guaiac-positive stools Referring Physician: [facility]  Mary Poole is an 67 y.o. female.  HPI: 89 -year-old black female with a history of atrial fibrillation on Xarelto, gives a history of any sinus infection,  went to an urgent care and [address] to get an antibiotic for her sinus infection. Patient was advised that her dizziness and weakness was from her low hemoglobin and was found to be at 7.4 g/dL. She was found to have occult blood in the stool and was therefore asked to come to the hospital for further evaluation. She denies having any melena or hematochezia appetite is good and her weight's been stable. She been taking Prilosec for the last couple of years for reflux. She denies having any dysphagia or odynophagia. There is no history of ulcers jaundice or colitis. She's never had a colonoscopy. She denies a family history of colon cancer celiac sprue. She has 2-3 bowel movements per day with no obvious blood or mucus in the stool. Her last dose of Xarelto was over 48 hours ago. Patient has received 2 units of packed red blood cells since admission hemoglobin is up to 10.1 g/dL her iron is low at 10 with a ferritin of 4 folate level 13.5.  Anemia Past Medical History:  Diagnosis Date  . GERD (gastroesophageal reflux disease)   . H/O atrial flutter   . Hypertension   . Migraine    Past Surgical History:  Procedure Laterality Date  . HERNIA REPAIR    . WISDOM TOOTH EXTRACTION     Family History  Problem Relation Age of Onset  . CAD Father   . AAA (abdominal aortic aneurysm) Sister   . Thyroid disease Sister    Social History:  reports that she has quit smoking. she has never used smokeless tobacco. She reports that she does not drink alcohol or use drugs.  Allergies:  Allergies  Allergen Reactions  . Diphenhydramine Hcl Other (See Comments)   Medications: I have reviewed the patient's current  medications.  Results for orders placed or performed during the hospital encounter of 02/22/17 (from the past 48 hour(s))  Basic metabolic panel     Status: Abnormal   Collection Time: 02/22/17 11:46 AM  Result Value Ref Range   Sodium 139 135 - 145 mmol/L   Potassium 3.8 3.5 - 5.1 mmol/L   Chloride 106 101 - 111 mmol/L   CO2 26 22 - 32 mmol/L   Glucose, Bld 110 (H) 65 - 99 mg/dL   BUN 15 6 - 20 mg/dL   Creatinine, Ser 0.92 0.44 - 1.00 mg/dL   Calcium 9.2 8.9 - 10.3 mg/dL   GFR calc non Af Amer >60 >60 mL/min   GFR calc Af Amer >60 >60 mL/min    Comment: (NOTE) The eGFR has been calculated using the CKD EPI equation. This calculation has not been validated in all clinical situations. eGFR's persistently <60 mL/min signify possible Chronic Kidney Disease.    Anion gap 7 5 - 15  CBC     Status: Abnormal   Collection Time: 02/22/17 11:46 AM  Result Value Ref Range   WBC 11.3 (H) 4.0 - 10.5 K/uL   RBC 2.88 (L) 3.87 - 5.11 MIL/uL   Hemoglobin 7.4 (L) 12.0 - 15.0 g/dL   HCT 24.1 (L) 36.0 - 46.0 %   MCV 83.7 78.0 - 100.0 fL   MCH 25.7 (L) 26.0 - 34.0 pg   MCHC 30.7  30.0 - 36.0 g/dL   RDW 15.6 (H) 11.5 - 15.5 %   Platelets 380 150 - 400 K/uL  I-stat troponin, ED     Status: None   Collection Time: 02/22/17 11:52 AM  Result Value Ref Range   Troponin i, poc 0.00 0.00 - 0.08 ng/mL   Comment 3            Comment: Due to the release kinetics of cTnI, a negative result within the first hours of the onset of symptoms does not rule out myocardial infarction with certainty. If myocardial infarction is still suspected, repeat the test at appropriate intervals.   POC occult blood, ED Provider will collect     Status: Abnormal   Collection Time: 02/22/17  5:56 PM  Result Value Ref Range   Fecal Occult Bld POSITIVE (A) NEGATIVE  Prepare RBC     Status: None   Collection Time: 02/22/17  7:29 PM  Result Value Ref Range   Order Confirmation ORDER PROCESSED BY BLOOD BANK   Type and  screen [place]     Status: None (Preliminary result)   Collection Time: 02/22/17  7:36 PM  Result Value Ref Range   ABO/RH(D) O POS    Antibody Screen NEG    Sample Expiration 02/25/2017    Unit Number [id number]    Blood Component Type RED CELLS,LR    Unit division 00    Status of Unit ISSUED,FINAL    Transfusion Status OK TO TRANSFUSE    Crossmatch Result Compatible    Unit Number [id number]    Blood Component Type RED CELLS,LR    Unit division 00    Status of Unit ISSUED    Transfusion Status OK TO TRANSFUSE    Crossmatch Result Compatible   Protime-INR     Status: Abnormal   Collection Time: 02/22/17  7:36 PM  Result Value Ref Range   Prothrombin Time 16.4 (H) 11.4 - 15.2 seconds   INR 1.33   PTT     Status: Abnormal   Collection Time: 02/22/17  7:36 PM  Result Value Ref Range   aPTT 37 (H) 24 - 36 seconds    Comment:        IF BASELINE aPTT IS ELEVATED, SUGGEST PATIENT RISK ASSESSMENT BE USED TO DETERMINE APPROPRIATE ANTICOAGULANT THERAPY.   ABO/Rh     Status: None   Collection Time: 02/22/17  7:36 PM  Result Value Ref Range   ABO/RH(D) O POS   Vitamin B12     Status: None   Collection Time: 02/22/17  8:51 PM  Result Value Ref Range   Vitamin B-12 744 180 - 914 pg/mL    Comment: (NOTE) This assay is not validated for testing neonatal or myeloproliferative syndrome specimens for Vitamin B12 levels.   Folate     Status: None   Collection Time: 02/22/17  8:51 PM  Result Value Ref Range   Folate 13.5 >5.9 ng/mL  Iron and TIBC     Status: Abnormal   Collection Time: 02/22/17  8:51 PM  Result Value Ref Range   Iron 10 (L) 28 - 170 ug/dL   TIBC 405 250 - 450 ug/dL   Saturation Ratios 2 (L) 10.4 - 31.8 %   UIBC 395 ug/dL  Ferritin     Status: Abnormal   Collection Time: 02/22/17  8:51 PM  Result Value Ref Range   Ferritin 4 (L) 11 - 307 ng/mL  Reticulocytes     Status: Abnormal  Collection Time: 02/22/17  8:51 PM  Result Value Ref  Range   Retic Ct Pct 1.9 0.4 - 3.1 %   RBC. 2.81 (L) 3.87 - 5.11 MIL/uL   Retic Count, Absolute 53.4 19.0 - 186.0 K/uL  Hemoglobin and hematocrit, blood     Status: Abnormal   Collection Time: 02/23/17  6:19 AM  Result Value Ref Range   Hemoglobin 10.1 (L) 12.0 - 15.0 g/dL    Comment: POST TRANSFUSION SPECIMEN   HCT 31.2 (L) 36.0 - 92.4 %  Basic metabolic panel     Status: Abnormal   Collection Time: 02/23/17  6:19 AM  Result Value Ref Range   Sodium 140 135 - 145 mmol/L   Potassium 3.8 3.5 - 5.1 mmol/L   Chloride 107 101 - 111 mmol/L   CO2 24 22 - 32 mmol/L   Glucose, Bld 101 (H) 65 - 99 mg/dL   BUN 11 6 - 20 mg/dL   Creatinine, Ser 0.81 0.44 - 1.00 mg/dL   Calcium 9.0 8.9 - 10.3 mg/dL   GFR calc non Af Amer >60 >60 mL/min   GFR calc Af Amer >60 >60 mL/min    Comment: (NOTE) The eGFR has been calculated using the CKD EPI equation. This calculation has not been validated in all clinical situations. eGFR's persistently <60 mL/min signify possible Chronic Kidney Disease.    Anion gap 9 5 - 15    Dg Chest 2 View  Result Date: 02/22/2017 CLINICAL DATA:  Shortness of breath. EXAM: CHEST  2 VIEW COMPARISON:  None. FINDINGS: The heart size and mediastinal contours are within normal limits. Both lungs are clear. No pneumothorax or pleural effusion is noted. The visualized skeletal structures are unremarkable. IMPRESSION: No active cardiopulmonary disease. Electronically Signed   By: Marijo Conception, M.D.   On: 02/22/2017 12:08    Review of Systems  Constitutional: Positive for malaise/fatigue. Negative for chills, diaphoresis, fever and weight loss.  HENT: Negative.   Eyes: Negative.   Respiratory: Positive for sputum production and shortness of breath. Negative for cough, hemoptysis and wheezing.   Cardiovascular: Negative.   Gastrointestinal: Positive for blood in stool, heartburn and melena. Negative for abdominal pain, constipation, diarrhea, nausea and vomiting.   Genitourinary: Negative.   Musculoskeletal: Positive for joint pain.  Skin: Negative.   Neurological: Positive for weakness.  Endo/Heme/Allergies: Negative.   Psychiatric/Behavioral: Negative.    Blood pressure (!) 142/76, pulse 77, temperature 98.1 F (36.7 C), temperature source Oral, resp. rate 18, SpO2 98 %. Physical Exam  Constitutional: She is oriented to person, place, and time. She appears well-developed and well-nourished.  Morbidly obese  HENT:  Head: Normocephalic and atraumatic.  Eyes: Conjunctivae and EOM are normal. Pupils are equal, round, and reactive to light.  Neck: Normal range of motion. Neck supple.  Cardiovascular: Normal rate and regular rhythm.  Respiratory: Effort normal and breath sounds normal.  GI: Soft. Bowel sounds are normal. She exhibits no distension. There is no tenderness. There is no rebound and no guarding.  Musculoskeletal: Normal range of motion.  Neurological: She is alert and oriented to person, place, and time.  Skin: Skin is warm and dry.  Psychiatric: She has a normal mood and affect. Her behavior is normal. Judgment and thought content normal.   Assessment/Plan: 1) iron deficiency anemia with guaiac-positive stools/history of GERD: An EGD and colonoscopy planned for tomorrow prep orders have been written.  2) History of atrial fibrillation Xarelto which has been on hold since 02/21/2017.  3) Morbid obesity. Soo Steelman 02/23/2017, 1:40 PM

## 2017-02-23 NOTE — ED Notes (Signed)
Pt transferred to floor; working on liquid tray; husband at bedside.

## 2017-02-23 NOTE — ED Notes (Signed)
Patient resting; no needs 

## 2017-02-24 ENCOUNTER — Encounter (HOSPITAL_COMMUNITY)
Admission: EM | Disposition: A | Discharge status: Home / Self Care | Payer: Self-pay | Source: Home / Self Care | Attending: Emergency Medicine

## 2017-02-24 ENCOUNTER — Encounter (HOSPITAL_COMMUNITY): Payer: Self-pay | Admitting: Emergency Medicine

## 2017-02-24 DIAGNOSIS — D649 Anemia, unspecified: Secondary | ICD-10-CM | POA: Diagnosis not present

## 2017-02-24 DIAGNOSIS — D500 Iron deficiency anemia secondary to blood loss (chronic): Secondary | ICD-10-CM | POA: Diagnosis not present

## 2017-02-24 DIAGNOSIS — R195 Other fecal abnormalities: Secondary | ICD-10-CM | POA: Diagnosis not present

## 2017-02-24 DIAGNOSIS — K5730 Diverticulosis of large intestine without perforation or abscess without bleeding: Secondary | ICD-10-CM | POA: Diagnosis not present

## 2017-02-24 DIAGNOSIS — R0602 Shortness of breath: Secondary | ICD-10-CM | POA: Diagnosis not present

## 2017-02-24 DIAGNOSIS — K31811 Angiodysplasia of stomach and duodenum with bleeding: Secondary | ICD-10-CM | POA: Diagnosis not present

## 2017-02-24 HISTORY — PX: ENTEROSCOPY: SHX5533

## 2017-02-24 HISTORY — PX: COLONOSCOPY: SHX5424

## 2017-02-24 LAB — BPAM RBC
BLOOD PRODUCT EXPIRATION DATE: 201902092359
Blood Product Expiration Date: 201902092359
ISSUE DATE / TIME: 201901152039
ISSUE DATE / TIME: 201901160140
Unit Type and Rh: 5100
Unit Type and Rh: 5100

## 2017-02-24 LAB — TYPE AND SCREEN
ABO/RH(D): O POS
Antibody Screen: NEGATIVE
UNIT DIVISION: 0
Unit division: 0

## 2017-02-24 LAB — CBC
HEMATOCRIT: 30.1 % — AB (ref 36.0–46.0)
Hemoglobin: 9.6 g/dL — ABNORMAL LOW (ref 12.0–15.0)
MCH: 26.4 pg (ref 26.0–34.0)
MCHC: 31.9 g/dL (ref 30.0–36.0)
MCV: 82.9 fL (ref 78.0–100.0)
Platelets: 319 10*3/uL (ref 150–400)
RBC: 3.63 MIL/uL — AB (ref 3.87–5.11)
RDW: 16 % — ABNORMAL HIGH (ref 11.5–15.5)
WBC: 11.8 10*3/uL — AB (ref 4.0–10.5)

## 2017-02-24 SURGERY — COLONOSCOPY
Anesthesia: Moderate Sedation

## 2017-02-24 MED ORDER — FENTANYL CITRATE (PF) 100 MCG/2ML IJ SOLN
INTRAMUSCULAR | Status: AC
Start: 2017-02-24 — End: ?
  Filled 2017-02-24: qty 4

## 2017-02-24 MED ORDER — BUTAMBEN-TETRACAINE-BENZOCAINE 2-2-14 % EX AERO
INHALATION_SPRAY | CUTANEOUS | Status: DC | PRN
  Administered 2017-02-24: 1 via TOPICAL

## 2017-02-24 MED ORDER — SODIUM CHLORIDE 0.9 % IV SOLN: INTRAVENOUS | Status: DC

## 2017-02-24 MED ORDER — FENTANYL CITRATE (PF) 100 MCG/2ML IJ SOLN
INTRAMUSCULAR | Status: DC | PRN
  Administered 2017-02-24 (×4): 25 ug via INTRAVENOUS

## 2017-02-24 MED ORDER — MIDAZOLAM HCL 5 MG/5ML IJ SOLN
INTRAMUSCULAR | Status: DC | PRN
  Administered 2017-02-24 (×2): 1 mg via INTRAVENOUS
  Administered 2017-02-24: 2 mg via INTRAVENOUS
  Administered 2017-02-24 (×2): 1 mg via INTRAVENOUS

## 2017-02-24 MED ORDER — RIVAROXABAN 20 MG PO TABS: 20.0000 mg | ORAL_TABLET | Freq: Every day | ORAL | 0 refills | Status: DC

## 2017-02-24 MED ORDER — IRON 325 (65 FE) MG PO TABS: 1.0000 | ORAL_TABLET | Freq: Two times a day (BID) | ORAL | 0 refills | Status: DC

## 2017-02-24 MED ORDER — MIDAZOLAM HCL 5 MG/ML IJ SOLN
INTRAMUSCULAR | Status: AC
  Filled 2017-02-24: qty 2

## 2017-02-24 MED ORDER — DIPHENHYDRAMINE HCL 50 MG/ML IJ SOLN
INTRAMUSCULAR | Status: AC
  Filled 2017-02-24: qty 1

## 2017-02-24 NOTE — Discharge Summary (Signed)
Physician Discharge Summary  Mary ServeSandra J Poole ZOX:[id number] DOB: 05/17/1950 DOA: 02/22/2017  PCP: Patient, No Pcp Per  Admit date: 02/22/2017 Discharge date: 02/24/2017  Admitted From:home Disposition:home  Recommendations for Outpatient Follow-up:  1. Follow up with PCP in 1-2 weeks 2. Please obtain BMP/CBC in one week  Home Health: No Equipment/Devices: No  Discharge Conditionstable: CODE STATUS: Full code Diet recommendation: Heart healthy  Brief/Interim Summary: 67 year old female with history of acid reflux, paroxysmal atrial flutter on anticoagulation with Xarelto presented to the ER with complaint of shortness of breath.  Patient reportedly went to urgent care and got treatment for sinus infection with antibiotics.  Blood work of weight hemoglobin of 7.4.  Sent to the hospital for further evaluation.  Symptomatic anemia/iron deficiency anemia due to subacute GI bleed in the setting of chronic anticoagulation use: Patient was treated with IV iron and discharging with oral iron.  Evaluated by GI and underwent upper and GI endoscopy.  Upper endoscopy showed possible single recently bleed angiodysplastic lesion in the stomach which was treated with a monopolar probe.  Discussed with Dr. Audley HoseHong who recommended to hold Xarelto for next to 3 days.  Patient will follow-up with PCP and recommend to follow-up with GI as outpatient.  I discussed above findings with the patient.  She verbalized understanding.  Colonoscopy showed diverticulosis in the descending colon and in the transverse colon.  Patient is clinically stable.  Received IV iron, blood transfusion.  Underwent endoscopy.  No further bleeding noticed.  She understand the importance of outpatient follow-up and watch for any sign of bleeding.  I recommended not to drive today.  She knows to hold Xarelto for next to 3 days.  Discharge Diagnoses:  Active Problems:   Symptomatic anemia    Discharge Instructions  Discharge  Instructions    Call MD for:  difficulty breathing, headache or visual disturbances   Complete by:  As directed    Call MD for:  extreme fatigue   Complete by:  As directed    Call MD for:  hives   Complete by:  As directed    Call MD for:  persistant dizziness or light-headedness   Complete by:  As directed    Call MD for:  persistant nausea and vomiting   Complete by:  As directed    Call MD for:  severe uncontrolled pain   Complete by:  As directed    Call MD for:  temperature >100.4   Complete by:  As directed    Diet - low sodium heart healthy   Complete by:  As directed    Increase activity slowly   Complete by:  As directed      Allergies as of 02/24/2017      Reactions   Diphenhydramine Hcl Other (See Comments)      Medication List    STOP taking these medications   amoxicillin-clavulanate 875-125 MG tablet Commonly known as:  AUGMENTIN     TAKE these medications   carvedilol 12.5 MG tablet Commonly known as:  COREG Take 1 tablet (12.5 mg total) by mouth 2 (two) times daily.   fluticasone 50 MCG/ACT nasal spray Commonly known as:  FLONASE Place 1 spray into both nostrils 2 (two) times daily.   Iron 325 (65 Fe) MG Tabs Take 1 tablet (325 mg total) by mouth 2 (two) times daily.   omeprazole 20 MG capsule Commonly known as:  PRILOSEC Take 20 mg by mouth daily.   rivaroxaban 20 MG Tabs tablet  Commonly known as:  XARELTO Take 1 tablet (20 mg total) by mouth daily. Please take after 3 days Start taking on:  02/27/2017 What changed:    additional instructions  These instructions start on 02/27/2017. If you are unsure what to do until then, ask your doctor or other care provider.      Follow-up Information    [facility]. Schedule an appointment as soon as possible for a visit in 1 week(s).   Contact information: [address] [place] [address] [place] [address] [phone]       Jeani Hawking, MD. Schedule an  appointment as soon as possible for a visit in 1 month(s).   Specialty:  Gastroenterology Contact information: [address] [place] [address] [phone]          Allergies  Allergen Reactions  . Diphenhydramine Hcl Other (See Comments)    Consultations: GI  Procedures/Studies: Upper and lower GI endoscopy  Subjective: Denied nausea vomiting chest pain shortness of breath.  No headache or dizziness.  Patient was seen today morning.  Discharge Exam: Vitals:   02/24/17 1555 02/24/17 1610  BP: (!) 154/61 132/62  Pulse: 86 76  Resp: 16 13  Temp:    SpO2: 100% 100%   Vitals:   02/24/17 1545 02/24/17 1550 02/24/17 1555 02/24/17 1610  BP: (!) 143/64 (!) 182/77 (!) 154/61 132/62  Pulse: 94 84 86 76  Resp: 20 17 16 13   Temp:      TempSrc:      SpO2: 99% 100% 100% 100%  Weight:      Height:        General: Pt is alert, awake, not in acute distress Cardiovascular: RRR, S1/S2 +, no rubs, no gallops Respiratory: CTA bilaterally, no wheezing, no rhonchi Abdominal: Soft, NT, ND, bowel sounds + Extremities: no edema, no cyanosis    The results of significant diagnostics from this hospitalization (including imaging, microbiology, ancillary and laboratory) are listed below for reference.     Microbiology: No results found for this or any previous visit (from the past 240 hour(s)).   Labs: BNP (last 3 results) No results for input(s): BNP in the last 8760 hours. Basic Metabolic Panel: Recent Labs  Lab 02/22/17 1146 02/23/17 0619  NA 139 140  K 3.8 3.8  CL 106 107  CO2 26 24  GLUCOSE 110* 101*  BUN 15 11  CREATININE 0.92 0.81  CALCIUM 9.2 9.0   Liver Function Tests: No results for input(s): AST, ALT, ALKPHOS, BILITOT, PROT, ALBUMIN in the last 168 hours. No results for input(s): LIPASE, AMYLASE in the last 168 hours. No results for input(s): AMMONIA in the last 168 hours. CBC: Recent Labs  Lab 02/22/17 1146 02/23/17 0619  02/24/17 0519  WBC 11.3*  --  11.8*  HGB 7.4* 10.1* 9.6*  HCT 24.1* 31.2* 30.1*  MCV 83.7  --  82.9  PLT 380  --  319   Cardiac Enzymes: No results for input(s): CKTOTAL, CKMB, CKMBINDEX, TROPONINI in the last 168 hours. BNP: Invalid input(s): POCBNP CBG: No results for input(s): GLUCAP in the last 168 hours. D-Dimer No results for input(s): DDIMER in the last 72 hours. Hgb A1c No results for input(s): HGBA1C in the last 72 hours. Lipid Profile No results for input(s): CHOL, HDL, LDLCALC, TRIG, CHOLHDL, LDLDIRECT in the last 72 hours. Thyroid function studies No results for input(s): TSH, T4TOTAL, T3FREE, THYROIDAB in the last 72 hours.  Invalid input(s): FREET3 Anemia work up  Recent Labs    02/22/17 2051  VITAMINB12 744  FOLATE 13.5  FERRITIN 4*  TIBC 405  IRON 10*  RETICCTPCT 1.9   Urinalysis No results found for: COLORURINE, APPEARANCEUR, LABSPEC, PHURINE, GLUCOSEU, HGBUR, BILIRUBINUR, KETONESUR, PROTEINUR, UROBILINOGEN, NITRITE, LEUKOCYTESUR Sepsis Labs Invalid input(s): PROCALCITONIN,  WBC,  LACTICIDVEN Microbiology No results found for this or any previous visit (from the past 240 hour(s)).   Time coordinating discharge:  30 minutes  SIGNED:   Maxie Barb, MD  [facility] 02/24/2017, 4:45 PM  If 7PM-7AM, please contact night-coverage [email or web address] Password [email or web address]

## 2017-02-24 NOTE — Op Note (Signed)
[facility] Patient Name: Mary Poole Procedure Date : 02/24/2017 MRN: [id number] Attending MD: Jeani Hawking , MD Date of Birth: 01/20/1951 CSN: [id number] Age: 67 Admit Type: Inpatient Procedure:                Small bowel enteroscopy Indications:              Iron deficiency anemia Providers:                Dwain Sarna, RN, Kandice Robinsons, Technician,                            Jeani Hawking, MD Referring MD:              Medicines:                See the colonoscopy report. Complications:            No immediate complications. Estimated Blood Poole:     Estimated blood Poole: none. Procedure:                Pre-Anesthesia Assessment:                           - Prior to the procedure, a History and Physical                            was performed, and patient medications and                            allergies were reviewed. The patient's tolerance of                            previous anesthesia was also reviewed. The risks                            and benefits of the procedure and the sedation                            options and risks were discussed with the patient.                            All questions were answered, and informed consent                            was obtained. Prior Anticoagulants: The patient has                            taken Xarelto (rivaroxaban), last dose was 1 day                            prior to procedure. ASA Grade Assessment: III - A                            patient with severe systemic disease. After  reviewing the risks and benefits, the patient was                            deemed in satisfactory condition to undergo the                            procedure.                           - Sedation was administered by an endoscopy nurse.                            The sedation level attained was moderate.                           After obtaining informed consent, the endoscope was                   passed under direct vision. Throughout the                            procedure, the patient's blood pressure, pulse, and                            oxygen saturations were monitored continuously. The                            EC-3490LI ([id number]) scope was introduced through                            the mouth, and advanced to the proximal jejunum.                            After obtaining informed consent, the endoscope was                            passed under direct vision. Throughout the                            procedure, the patient's blood pressure, pulse, and                            oxygen saturations were monitored continuously. The                            small bowel enteroscopy was accomplished without                            difficulty. The patient tolerated the procedure                            well. Scope In: Scope Out: Findings:      The esophagus was normal.      A single 2 mm stigmata of recent bleeding angiodysplastic lesion was       found in the gastric body. Coagulation for  hemostasis using monopolar       probe was successful. Estimated blood Poole: none.      The examined duodenum was normal.      There was no evidence of significant pathology in the entire examined       portion of jejunum.      A possible small gastric AVM was noted. I was not able discern if this       was from scope trauma versus a true angiodysplatic lesion. I opted to       ablate the lesion with APC. Impression:               - Normal esophagus.                           - A possible single recently bleeding                            angiodysplastic lesion in the stomach. Treated with                            a monopolar probe.                           - Normal examined duodenum.                           - The examined portion of the jejunum was normal.                           - No specimens collected. Recommendation:           - Return patient to  hospital ward for ongoing care.                           - Resume regular diet.                           - Restart Xarelto in 2-3 days.                           If anemia recurs, then a capsule endoscopy can be                            performed as an outptient. Procedure Code(s):        --- Professional ---                           [phone], Small intestinal endoscopy, enteroscopy                            beyond second portion of duodenum, not including                            ileum; with control of bleeding (eg, injection,                            bipolar cautery, unipolar cautery, laser,  heater                            probe, stapler, plasma coagulator) Diagnosis Code(s):        --- Professional ---                           K31.811, Angiodysplasia of stomach and duodenum                            with bleeding                           D50.9, Iron deficiency anemia, unspecified CPT copyright 2016 American Medical Association. All rights reserved. The codes documented in this report are preliminary and upon coder review may  be revised to meet current compliance requirements. Jeani HawkingPatrick Reha Martinovich, MD Jeani HawkingPatrick Jaquasha Carnevale, MD 02/24/2017 4:17:27 PM This report has been signed electronically. Number of Addenda: 0

## 2017-02-24 NOTE — Op Note (Signed)
[facility] Patient Name: Mary DanaSandra Poole Procedure Date : 02/24/2017 MRN: [id number] Attending MD: Jeani HawkingPatrick Amaliya Whitelaw , MD Date of Birth: 07/30/1950 CSN: [id number] Age: [age] Admit Type: Inpatient Procedure:                Colonoscopy Indications:              Iron deficiency anemia Providers:                Dwain SarnaPatricia Ford, RN, Kandice RobinsonsGuillaume Awaka, Technician,                            Jeani HawkingPatrick Orit Sanville, MD Referring MD:              Medicines:                Fentanyl 100 micrograms IV, Midazolam 6 mg IV Complications:            No immediate complications. Estimated Blood Loss:     Estimated blood loss: none. Procedure:                Pre-Anesthesia Assessment:                           - Prior to the procedure, a History and Physical                            was performed, and patient medications and                            allergies were reviewed. The patient's tolerance of                            previous anesthesia was also reviewed. The risks                            and benefits of the procedure and the sedation                            options and risks were discussed with the patient.                            All questions were answered, and informed consent                            was obtained. Prior Anticoagulants: The patient has                            taken Xarelto (rivaroxaban), last dose was 1 day                            prior to procedure. ASA Grade Assessment: III - A                            patient with severe systemic disease. After  reviewing the risks and benefits, the patient was                            deemed in satisfactory condition to undergo the                            procedure.                           - Sedation was administered by an endoscopy nurse.                            The sedation level attained was moderate.                           After obtaining informed consent, the colonoscope                             was passed under direct vision. Throughout the                            procedure, the patient's blood pressure, pulse, and                            oxygen saturations were monitored continuously. The                            EC-3490LI ([id number]) scope was introduced through                            the anus and advanced to the the cecum, identified                            by appendiceal orifice and ileocecal valve. The                            colonoscopy was performed without difficulty. The                            patient tolerated the procedure well. The quality                            of the bowel preparation was good. The ileocecal                            valve, appendiceal orifice, and rectum were                            photographed. Scope In: 3:52:14 PM Scope Out: 4:07:30 PM Scope Withdrawal Time: 0 hours 8 minutes 39 seconds  Total Procedure Duration: 0 hours 15 minutes 16 seconds  Findings:      A few medium-mouthed diverticula were found in the descending colon and       transverse colon. Impression:               -  Diverticulosis in the descending colon and in the                            transverse colon.                           - No specimens collected. Moderate Sedation:      Moderate (conscious) sedation was administered by the endoscopy nurse       and supervised by the endoscopist. The following parameters were       monitored: oxygen saturation, heart rate, blood pressure, and response       to care. Recommendation:           - Return patient to hospital ward for ongoing care.                           - Resume regular diet.                           - Continue present medications.                           - Repeat colonoscopy in 10 years for surveillance. Procedure Code(s):        --- Professional ---                           [phone], Colonoscopy, flexible; diagnostic, including                            collection  of specimen(s) by brushing or washing,                            when performed (separate procedure) Diagnosis Code(s):        --- Professional ---                           D50.9, Iron deficiency anemia, unspecified                           K57.30, Diverticulosis of large intestine without                            perforation or abscess without bleeding CPT copyright 2016 American Medical Association. All rights reserved. The codes documented in this report are preliminary and upon coder review may  be revised to meet current compliance requirements. Jeani Hawking, MD Jeani Hawking, MD 02/24/2017 4:12:58 PM This report has been signed electronically. Number of Addenda: 0

## 2017-02-24 NOTE — Interval H&P Note (Signed)
History and Physical Interval Note:  02/24/2017 3:12 PM  Mary Poole  has presented today for surgery, with the diagnosis of Anemia with guaiac positive stools  The various methods of treatment have been discussed with the patient and family. After consideration of risks, benefits and other options for treatment, the patient has consented to  Procedure(s): EGD/COLONOSCOPY (N/A) ESOPHAGOGASTRODUODENOSCOPY (EGD) (N/A) as a surgical intervention .  The patient's history has been reviewed, patient examined, no change in status, stable for surgery.  I have reviewed the patient's chart and labs.  Questions were answered to the patient's satisfaction.     Ashanti Ratti D

## 2017-02-24 NOTE — Progress Notes (Signed)
Patient discharge teaching given to patient and daughter, including activity, diet, follow-up appoints, and medications. Patient verbalized understanding of all discharge instructions. IV access was d/c'd. Vitals are stable. Skin is intact except as charted in most recent assessments. Pt to be escorted out by NT, to be driven home by daughter.

## 2017-02-28 ENCOUNTER — Encounter (HOSPITAL_COMMUNITY): Payer: Self-pay | Admitting: Gastroenterology

## 2017-03-03 ENCOUNTER — Telehealth: Payer: Self-pay | Admitting: Cardiology

## 2017-03-03 NOTE — Telephone Encounter (Signed)
Pt informing us that she recently was admitted for GI bleed.  She states that they told her to restart Xarelto but pt is worried and wants to make sure this is safe to do and if she truly needs to be on it as she is afraid of another bleed. She would like me to discuss w/ Dr. Elberta Fortisamnitz and gets his thoughts.  She understands I will call once discussed.

## 2017-03-03 NOTE — Telephone Encounter (Signed)
New message   Patient calling with concerns about taking rivaroxaban (XARELTO) 20 MG TABS tablet. Please call  Pt c/o medication issue:  1. Name of Medication: rivaroxaban (XARELTO) 20 MG TABS tablet  2. How are you currently taking this medication (dosage and times per day)? As prescribed  3. Are you having a reaction (difficulty breathing--STAT)? No  4. What is your medication issue? Patient concerns about internal bleeding

## 2017-03-16 NOTE — Telephone Encounter (Addendum)
Left detailed message (DPR on file) informing patient to restart/remain on Xarelto.

## 2017-04-07 ENCOUNTER — Other Ambulatory Visit: Payer: Self-pay | Admitting: *Deleted

## 2017-04-07 MED ORDER — RIVAROXABAN 20 MG PO TABS: 20.0000 mg | ORAL_TABLET | Freq: Every day | ORAL | 3 refills | Status: DC

## 2017-04-07 NOTE — Telephone Encounter (Signed)
Pharmacy requesting ninety days. 

## 2017-04-07 NOTE — Telephone Encounter (Signed)
Xarelto 20mg  refill request received; pt is 67 yrs old, wt-98kg, Crea-0.81 on 02/23/17, last seen by Dr. Elberta Fortisamnitz on 01/21/17, CrCl-124.6135ml/min; will send in refill to requested pharmacy.

## 2017-04-15 ENCOUNTER — Other Ambulatory Visit: Payer: Self-pay | Admitting: *Deleted

## 2017-04-15 MED ORDER — CARVEDILOL 12.5 MG PO TABS: 12.5000 mg | ORAL_TABLET | Freq: Two times a day (BID) | ORAL | 3 refills | Status: DC

## 2017-04-20 ENCOUNTER — Ambulatory Visit: Payer: 59 | Admitting: Interventional Cardiology

## 2017-04-27 NOTE — Telephone Encounter (Signed)
Reached out to Amy via staff message 3/19 to to resubmit for patient insurance. Awaiting advisement.

## 2017-05-20 NOTE — Telephone Encounter (Addendum)
Gaynelle CageWaddell, Wanda M, CMA  Reesa ChewJones, Tymarion Everard G, CMA        No that would not come to me. It has Amy's contact information on it. So it will go to her       Staff message sent to Amy to see if there is any update?

## 2017-05-20 NOTE — Telephone Encounter (Signed)
-----   Message -----  From: Reesa ChewJones, Mellany Dinsmore G, CMA  Sent: 05/19/2017  4:37 PM  To: Gaynelle CageWanda M Waddell, CMA  Subject: FW: Re-submission                 Amy said she was checking on this have you heard anything?  Thanks  ----- Message -----  From: Haywood FillerHiggins, Amy C  Sent: 04/29/2017  2:25 PM  To: Reesa Cheworothea G Webb Weed, CMA  Subject: RE: Regis BillRe-submission                 Nina, they denied it as a duplicate. I've resubmitted and made clear it is a titration following a home study. I'll let you know once I hear back.  ----- Message -----  From: Reesa ChewJones, Vinetta Brach G, CMA  Sent: 04/26/2017  9:47 AM  To: Haywood FillerAmy C Higgins  Subject: Re-submission                   Patient was ordered to have CPAP titration insurance denied due to lack of information so do we have update on her insurance pre cert for CPAP titration ?  Thanks, nina

## 2017-06-09 ENCOUNTER — Encounter: Payer: Self-pay | Admitting: *Deleted

## 2017-06-09 ENCOUNTER — Ambulatory Visit (INDEPENDENT_AMBULATORY_CARE_PROVIDER_SITE_OTHER): Payer: Medicare Other | Admitting: Interventional Cardiology

## 2017-06-09 ENCOUNTER — Encounter: Payer: Self-pay | Admitting: Interventional Cardiology

## 2017-06-09 VITALS — BP 154/92 | HR 77 | Ht 65.0 in | Wt 213.8 lb

## 2017-06-09 DIAGNOSIS — R0602 Shortness of breath: Secondary | ICD-10-CM | POA: Diagnosis not present

## 2017-06-09 DIAGNOSIS — I456 Pre-excitation syndrome: Secondary | ICD-10-CM

## 2017-06-09 DIAGNOSIS — I480 Paroxysmal atrial fibrillation: Secondary | ICD-10-CM

## 2017-06-09 DIAGNOSIS — K257 Chronic gastric ulcer without hemorrhage or perforation: Secondary | ICD-10-CM | POA: Diagnosis not present

## 2017-06-09 DIAGNOSIS — I4892 Unspecified atrial flutter: Secondary | ICD-10-CM | POA: Insufficient documentation

## 2017-06-09 DIAGNOSIS — I10 Essential (primary) hypertension: Secondary | ICD-10-CM | POA: Diagnosis not present

## 2017-06-09 DIAGNOSIS — I483 Typical atrial flutter: Secondary | ICD-10-CM

## 2017-06-09 NOTE — Telephone Encounter (Addendum)
Patient is scheduled for lab study on 07/08/17. Patient understands her titration study will be done at [facility] sleep lab. Patient understands she will receive a sleep packet in a week or so. Patient understands to call if she does not receive the sleep packet in a timely manner. Patient agrees with treatment and thanked me for call.

## 2017-06-09 NOTE — Progress Notes (Signed)
Cardiology Office Note    Date:  06/09/2017   ID:  Mary Poole, DOB Jul 16, 1950, MRN [id number]  PCP:  Patient, No Pcp Per  Cardiologist: Lesleigh Noe, MD   Chief Complaint  Patient presents with  . Atrial Fibrillation    History of Present Illness:  Mary Poole is a 67 y.o. female with mild to moderate obstructive sleep apnea, history of atrial flutter with one-to-one AV conduction at heart rates greater than 220 bpm, essential hypertension, with refusal of ablation therapy for atrial flutter.  Atrial flutter is being managed with medical therapy.  CHADS VASC score is 3.  The patient is doing well.  She is frustrated because she is diagnosed with atrial flutter.  Sleep apnea is been diagnosed and we feel it may be a trigger for disease.  She has never been able to successfully undergo CPAP titration.  Her medication regimen has been changed around by Dr. [name].  Her blood pressure today is elevated.  She had a recent emergency room visit because of fatigue and was found to have significant anemia.  GI work-up demonstrated a healing angiodysplastic vessel in her stomach that was felt to be the source of slow bleeding and development of iron deficiency.  Past Medical History:  Diagnosis Date  . GERD (gastroesophageal reflux disease)   . H/O atrial flutter   . Hypertension   . Migraine   . Sleep apnea     Past Surgical History:  Procedure Laterality Date  . COLONOSCOPY N/A 02/24/2017   Procedure: COLONOSCOPY;  Surgeon: Jeani Hawking, MD;  Location: [facility] ENDOSCOPY;  Service: Gastroenterology;  Laterality: N/A;  . ENTEROSCOPY N/A 02/24/2017   Procedure: ENTEROSCOPY;  Surgeon: Jeani Hawking, MD;  Location: [facility] ENDOSCOPY;  Service: Gastroenterology;  Laterality: N/A;  . HERNIA REPAIR    . WISDOM TOOTH EXTRACTION      Current Medications: Outpatient Medications Prior to Visit  Medication Sig Dispense Refill  . carvedilol (COREG) 12.5 MG tablet Take 1 tablet (12.5 mg total) by  mouth 2 (two) times daily. 60 tablet 3  . Ferrous Sulfate (IRON) 325 (65 Fe) MG TABS Take 1 tablet (325 mg total) by mouth 2 (two) times daily. 60 each 0  . fluticasone (FLONASE) 50 MCG/ACT nasal spray Place 1 spray into both nostrils 2 (two) times daily.  0  . omeprazole (PRILOSEC) 20 MG capsule Take 20 mg by mouth daily.    . rivaroxaban (XARELTO) 20 MG TABS tablet Take 1 tablet (20 mg total) by mouth daily. Please take after 3 days 90 tablet 3   No facility-administered medications prior to visit.      Allergies:   Diphenhydramine hcl   Social History   Socioeconomic History  . Marital status: Single    Spouse name: Not on file  . Number of children: Not on file  . Years of education: Not on file  . Highest education level: Not on file  Occupational History  . Not on file  Social Needs  . Financial resource strain: Not on file  . Food insecurity:    Worry: Not on file    Inability: Not on file  . Transportation needs:    Medical: Not on file    Non-medical: Not on file  Tobacco Use  . Smoking status: Former Games developer  . Smokeless tobacco: Never Used  Substance and Sexual Activity  . Alcohol use: No  . Drug use: No  . Sexual activity: Not on file  Lifestyle  .  Physical activity:    Days per week: Not on file    Minutes per session: Not on file  . Stress: Not on file  Relationships  . Social connections:    Talks on phone: Not on file    Gets together: Not on file    Attends religious service: Not on file    Active member of club or organization: Not on file    Attends meetings of clubs or organizations: Not on file    Relationship status: Not on file  Other Topics Concern  . Not on file  Social History Narrative  . Not on file     Family History:  The patient's family history includes AAA (abdominal aortic aneurysm) in her sister; CAD in her father; Thyroid disease in her sister.   ROS:   Please see the history of present illness.    Recent hospital stay with  fatigue and dyspnea related to anemia.  Felt to be GI source.  Xarelto was held for 72 hours.  Iron infusion and oral iron therapy was started.  An angiodysplastic lesion was found. Has sleep apnea diagnosed with therapy with CPAP has not been started because she has not had titration yet. All other systems reviewed and are negative.   PHYSICAL EXAM:   VS:  BP (!) 154/92   Pulse 77   Ht  (1.651 m)   Wt 213 lb 12.8 oz (97 kg)   BMI 35.58 kg/m    GEN: Well nourished, well developed, in no acute distress  HEENT: normal  Neck: no JVD, carotid bruits, or masses Cardiac: RRR; no murmurs, rubs, or gallops,no edema  Respiratory:  clear to auscultation bilaterally, normal work of breathing GI: soft, nontender, nondistended, + BS MS: no deformity or atrophy  Skin: warm and dry, no rash Neuro:  Alert and Oriented x 3, Strength and sensation are intact Psych: euthymic mood, full affect  Wt Readings from Last 3 Encounters:  06/09/17 213 lb 12.8 oz (97 kg)  02/24/17 214 lb (97.1 kg)  01/21/17 216 lb (98 kg)      Studies/Labs Reviewed:   EKG:  EKG  none  Recent Labs: 02/23/2017: BUN 11; Creatinine, Ser 0.81; Potassium 3.8; Sodium 140 02/24/2017: Hemoglobin 9.6; Platelets 319   Lipid Panel No results found for: CHOL, TRIG, HDL, CHOLHDL, VLDL, LDLCALC, LDLDIRECT  Additional studies/ records that were reviewed today include:  None    ASSESSMENT:    1. Accessory atrioventricular pathway   2. Typical atrial flutter (HCC)   3. Essential hypertension   4. Paroxysmal atrial fibrillation (HCC)   5. Shortness of breath   6. Chronic gastric ulcer without hemorrhage and without perforation      PLAN:  In order of problems listed above:  1. Controlled on medical therapy for the time being.  EP person is Dr. [name].  She will follow-up with him this summer. 2. No clinical recurrence. 3. Blood pressure is elevated.  Target is 130/80 mmHg.  Low-salt diet, weight loss, control of  sleep apnea recommended. 4. No recurrence 5. Changed 6. No gross bleeding at this time despite resumption of Xarelto.  We are trying to get the patient said to have CPAP titration and to start CPAP.  Low-salt diet, weight loss, and exercise were recommended.  Blood pressure still above 140/90 mmHg will return in the summer she will need to have low-dose diuretic therapy added or beta-blocker further uptitrated.    Medication Adjustments/Labs and Tests Ordered: Current medicines  are reviewed at length with the patient today.  Concerns regarding medicines are outlined above.  Medication changes, Labs and Tests ordered today are listed in the Patient Instructions below. There are no Patient Instructions on file for this visit.   Signed, Lesleigh Noe, MD  06/09/2017 2:04 PM    [facility] [address] [place], [place], [place]  [address] Phone: [phone]; Fax: [phone]

## 2017-06-09 NOTE — Patient Instructions (Signed)

## 2017-06-19 ENCOUNTER — Other Ambulatory Visit: Payer: Self-pay | Admitting: Interventional Cardiology

## 2017-06-20 NOTE — Telephone Encounter (Signed)
Xarelto  refill request received; pt is 67 yrs old, wt-97kg, Crea-0.81 on 02/23/17, last seen by Dr. Katrinka Blazing on 06/28/17, CrCl-104.51ml/min; will send in refill to requested pharmacy.

## 2017-07-08 ENCOUNTER — Ambulatory Visit (HOSPITAL_BASED_OUTPATIENT_CLINIC_OR_DEPARTMENT_OTHER): Payer: Medicare Other | Attending: Cardiology | Admitting: Cardiology

## 2017-07-08 ENCOUNTER — Encounter

## 2017-07-08 VITALS — Ht 65.0 in | Wt 215.0 lb

## 2017-07-08 DIAGNOSIS — G4730 Sleep apnea, unspecified: Secondary | ICD-10-CM | POA: Diagnosis not present

## 2017-07-08 DIAGNOSIS — G4733 Obstructive sleep apnea (adult) (pediatric): Secondary | ICD-10-CM | POA: Diagnosis not present

## 2017-07-08 DIAGNOSIS — R0902 Hypoxemia: Secondary | ICD-10-CM

## 2017-07-10 ENCOUNTER — Other Ambulatory Visit: Payer: Self-pay | Admitting: Cardiology

## 2017-07-19 ENCOUNTER — Ambulatory Visit: Payer: Medicare Other | Admitting: Family Medicine

## 2017-07-20 ENCOUNTER — Encounter: Payer: Self-pay | Admitting: Family Medicine

## 2017-07-20 ENCOUNTER — Ambulatory Visit (INDEPENDENT_AMBULATORY_CARE_PROVIDER_SITE_OTHER): Payer: Medicare Other | Admitting: Family Medicine

## 2017-07-20 VITALS — BP 128/84 | HR 71 | Temp 98.2°F | Ht 65.0 in | Wt 216.0 lb

## 2017-07-20 DIAGNOSIS — K219 Gastro-esophageal reflux disease without esophagitis: Secondary | ICD-10-CM | POA: Diagnosis not present

## 2017-07-20 DIAGNOSIS — I480 Paroxysmal atrial fibrillation: Secondary | ICD-10-CM

## 2017-07-20 DIAGNOSIS — D500 Iron deficiency anemia secondary to blood loss (chronic): Secondary | ICD-10-CM

## 2017-07-20 DIAGNOSIS — E079 Disorder of thyroid, unspecified: Secondary | ICD-10-CM | POA: Diagnosis not present

## 2017-07-20 DIAGNOSIS — I10 Essential (primary) hypertension: Secondary | ICD-10-CM | POA: Diagnosis not present

## 2017-07-20 LAB — BASIC METABOLIC PANEL
BUN: 19 mg/dL (ref 6–23)
CALCIUM: 9.5 mg/dL (ref 8.4–10.5)
CHLORIDE: 104 meq/L (ref 96–112)
CO2: 27 meq/L (ref 19–32)
Creatinine, Ser: 0.88 mg/dL (ref 0.40–1.20)
GFR: 82.48 mL/min (ref >60.00)
Glucose, Bld: 106 mg/dL — ABNORMAL HIGH (ref 70–99)
Potassium: 4 mEq/L (ref 3.5–5.1)
SODIUM: 139 meq/L (ref 135–145)

## 2017-07-20 LAB — CBC
HEMATOCRIT: 37.5 % (ref 36.0–46.0)
HEMOGLOBIN: 12.1 g/dL (ref 12.0–15.0)
MCHC: 32.4 g/dL (ref 30.0–36.0)
MCV: 82.8 fl (ref 78.0–100.0)
Platelets: 221 10*3/uL (ref 150.0–400.0)
RBC: 4.52 Mil/uL (ref 3.87–5.11)
RDW: 17.6 % — ABNORMAL HIGH (ref 11.5–15.5)
WBC: 7.6 10*3/uL (ref 4.0–10.5)

## 2017-07-20 LAB — TSH: TSH: 0.41 u[IU]/mL (ref 0.35–4.50)

## 2017-07-20 LAB — FERRITIN: FERRITIN: 13.9 ng/mL (ref 10.0–291.0)

## 2017-07-20 NOTE — Patient Instructions (Signed)
It was very nice to meet you! I do not have a record of your last mammogram, check records at home and schedule if it has been more than 1 year.  Let me know if you need help doing this.   We'll let you know when your lab results return and plan follow up for you based on these results.

## 2017-07-20 NOTE — Assessment & Plan Note (Signed)
Asymptomatic at this time Update CBC and iron panel

## 2017-07-20 NOTE — Assessment & Plan Note (Signed)
Well controlled with omeprazole, continue.   

## 2017-07-20 NOTE — Assessment & Plan Note (Signed)
BP is well controlled at this time, continue coreg Follow low salt diet.

## 2017-07-20 NOTE — Progress Notes (Signed)
Mary Poole - 67 y.o. female MRN [id number]  Date of birth: 1951-01-08  Subjective Chief Complaint  Patient presents with  . Establish Care    needs to est care and would like to check Hgb. Dx years ago with thyroid condition..Fasting today.    HPI Mary Poole is a 67 y.o. female with history of Atrial Fib/flutter, GERD, Anemia and thyroid disease here today to establish care with new pcp.    -A. Fib:  She continues to follow with her cardiologist for management of a. Fib which is well controlled with current medications.  She is anticoagulated with xarelto and doing well with this.  She was admitted in January 2019 for symptomatic anemia and found to have AVM.   She has not noticed any bleeding since that time and denies increased fatigue.  -GERD:  Maintained on omeprazole which is working well for symptom control.  Denies abdominal pain, nausea or melena.    -Thyroid disease:  She reports a remote history of thyroid disease, previously on medication for hypothyroidism.  She has not had any increases symptoms of hypo/hyper-thyroidism that she has noticed.    Health maintenance:  She thinks that she has had a mammogram in the past year and will check at home.  She declines pneumococcal vaccine.  She had a bone density test through her work and declines DEXA at this time.    ROS:  ROS completed and negative except as noted per HPI  Allergies  Allergen Reactions  . Diphenhydramine Hcl Other (See Comments)    Past Medical History:  Diagnosis Date  . GERD (gastroesophageal reflux disease)   . H/O atrial flutter   . Hypertension   . Migraine   . Sleep apnea     Past Surgical History:  Procedure Laterality Date  . COLONOSCOPY N/A 02/24/2017   Procedure: COLONOSCOPY;  Surgeon: Jeani Hawking, MD;  Location: [facility] ENDOSCOPY;  Service: Gastroenterology;  Laterality: N/A;  . ENTEROSCOPY N/A 02/24/2017   Procedure: ENTEROSCOPY;  Surgeon: Jeani Hawking, MD;  Location: [facility] ENDOSCOPY;   Service: Gastroenterology;  Laterality: N/A;  . HERNIA REPAIR    . WISDOM TOOTH EXTRACTION      Social History   Socioeconomic History  . Marital status: Married    Spouse name: Not on file  . Number of children: Not on file  . Years of education: Not on file  . Highest education level: Not on file  Occupational History  . Not on file  Social Needs  . Financial resource strain: Not on file  . Food insecurity:    Worry: Not on file    Inability: Not on file  . Transportation needs:    Medical: Not on file    Non-medical: Not on file  Tobacco Use  . Smoking status: Former Games developer  . Smokeless tobacco: Never Used  Substance and Sexual Activity  . Alcohol use: No  . Drug use: No  . Sexual activity: Not on file  Lifestyle  . Physical activity:    Days per week: Not on file    Minutes per session: Not on file  . Stress: Not on file  Relationships  . Social connections:    Talks on phone: Not on file    Gets together: Not on file    Attends religious service: Not on file    Active member of club or organization: Not on file    Attends meetings of clubs or organizations: Not on file    Relationship  status: Not on file  Other Topics Concern  . Not on file  Social History Narrative  . Not on file    Family History  Problem Relation Age of Onset  . CAD Father   . AAA (abdominal aortic aneurysm) Sister   . Thyroid disease Sister     Health Maintenance  Topic Date Due  . Hepatitis C Screening  Nov 25, 1950  . TETANUS/TDAP  10/10/1969  . MAMMOGRAM  10/10/2000  . DEXA SCAN  10/11/2015  . PNA vac Low Risk Adult (1 of 2 - PCV13) 10/11/2015  . INFLUENZA VACCINE  09/08/2017  . COLONOSCOPY  02/25/2027    ----------------------------------------------------------------------------------------------------------------------------------------------------------------------------------------------------------------- Physical Exam BP 128/84 (BP Location: Right Arm, Patient  Position: Sitting, Cuff Size: Normal)   Pulse 71   Temp 98.2 F (36.8 C) (Oral)   Ht 5\' 5"  (1.651 m)   Wt 216 lb (98 kg)   SpO2 97%   BMI 35.94 kg/m   Physical Exam  Constitutional: She is oriented to person, place, and time. She appears well-nourished. No distress.  HENT:  Head: Normocephalic and atraumatic.  Mouth/Throat: Oropharynx is clear and moist.  Eyes: No scleral icterus.  Neck: Neck supple. No thyromegaly present.  Cardiovascular: Normal rate, regular rhythm, normal heart sounds and intact distal pulses.  Pulmonary/Chest: Effort normal and breath sounds normal.  Musculoskeletal: She exhibits no edema.  Lymphadenopathy:    She has no cervical adenopathy.  Neurological: She is alert and oriented to person, place, and time. No cranial nerve deficit. Coordination normal.  Skin: Skin is warm and dry.  Psychiatric: She has a normal mood and affect. Her behavior is normal.    ------------------------------------------------------------------------------------------------------------------------------------------------------------------------------------------------------------------- Assessment and Plan  Iron deficiency anemia due to chronic blood loss Asymptomatic at this time Update CBC and iron panel  Essential hypertension BP is well controlled at this time, continue coreg Follow low salt diet.   Paroxysmal atrial fibrillation (HCC) RRR on exam today Rate controlled with coreg Anticoagulated with xarelto without s/s of bleeding at this time.  She will continue to follow with cardiology.   Thyroid disease Update TSH today.   GERD (gastroesophageal reflux disease) Well controlled with omeprazole, continue.

## 2017-07-20 NOTE — Procedures (Signed)
   Patient Name: Mary Poole, Mary Poole Study Date: 07/08/2017 Gender: Female D.O.B: 08/25/1950 Age (years): 66 Referring Provider: Verdis PrimeHenry Smith Height (inches): 65 Interpreting Physician: Armanda Magicraci Monna Crean MD, ABSM Weight (lbs): 215 RPSGT: Cherylann ParrDubili, Fred BMI: 36 MRN: [id number] Neck Size: 17.50  CLINICAL INFORMATION The patient is referred for a CPAP titration to treat sleep apnea.???????  SLEEP STUDY TECHNIQUE As per the AASM Manual for the Scoring of Sleep and Associated Events v2.3 (April 2016) with a hypopnea requiring 4% desaturations.  The channels recorded and monitored were frontal, central and occipital EEG, electrooculogram (EOG), submentalis EMG (chin), nasal and oral airflow, thoracic and abdominal wall motion, anterior tibialis EMG, snore microphone, electrocardiogram, and pulse oximetry. Continuous positive airway pressure (CPAP) was initiated at the beginning of the study and titrated to treat sleep-disordered breathing.  MEDICATIONS Medications self-administered by patient taken the night of the study : N/A  TECHNICIAN COMMENTS Comments added by technician: Patient had difficulty initiating sleep. Comments added by scorer: N/A  RESPIRATORY PARAMETERS Optimal PAP Pressure (cm): 10  AHI at Optimal Pressure (/hr):0.0 Overall Minimal O2 (%):87.0  Supine % at Optimal Pressure (%): 100 Minimal O2 at Optimal Pressure (%): 93.0   SLEEP ARCHITECTURE The study was initiated at 10:15:12 PM and ended at 4:35:29 AM.  Sleep onset time was 25.8 minutes and the sleep efficiency was 82.2%%. The total sleep time was 312.5 minutes.  The patient spent 3.2%% of the night in stage N1 sleep, 64.2%% in stage N2 sleep, 0.0%% in stage N3 and 32.64% in REM.Stage REM latency was 52.5 minutes  Wake after sleep onset was 41.9. Alpha intrusion was absent. Supine sleep was 52.71%.  CARDIAC DATA The 2 lead EKG demonstrated sinus rhythm. The mean heart rate was 68.5 beats per minute. Other EKG findings  include: sinus arrhythmia  LEG MOVEMENT DATA The total Periodic Limb Movements of Sleep (PLMS) were 0. The PLMS index was 0.0. A PLMS index of <15 is considered normal in adults.  IMPRESSIONS - The optimal PAP pressure was 10 cm of water. - Central sleep apnea was not noted during this titration (CAI = 0.2/h). - Mild oxygen desaturations were observed during this titration (min O2 = 87.0%). - No snoring was audible during this study. - 2-lead EKG demonstrated: sinus arrhythmia - Clinically significant periodic limb movements were not noted during this study. Arousals associated with PLMs were rare.  DIAGNOSIS - Obstructive Sleep Apnea (327.23 [G47.33 ICD-10])  RECOMMENDATIONS - Trial of CPAP therapy on 10 cm H2O with a Small Cushion size [employer] Respironics Minimal Contact Dreamwear Under Nose Frame (S) mask and heated humidification. - Avoid alcohol, sedatives and other CNS depressants that may worsen sleep apnea and disrupt normal sleep architecture. - Sleep hygiene should be reviewed to assess factors that may improve sleep quality. - Weight management and regular exercise should be initiated or continued. - Return to Sleep Center for re-evaluation after 10 weeks of therapy  [Electronically signed] 07/20/2017 08:52 PM  Armanda Magicraci Macsen Nuttall MD, ABSM Diplomate, American Board of Sleep Medicine

## 2017-07-20 NOTE — Assessment & Plan Note (Signed)
RRR on exam today Rate controlled with coreg Anticoagulated with xarelto without s/s of bleeding at this time.  She will continue to follow with cardiology.

## 2017-07-20 NOTE — Assessment & Plan Note (Signed)
Update TSH today. 

## 2017-07-21 LAB — IRON AND TIBC
IRON: 29 ug/dL (ref 27–139)
Iron Saturation: 9 % — CL (ref 15–55)
TIBC: 335 ug/dL (ref 250–450)
UIBC: 306 ug/dL (ref 118–369)

## 2017-07-25 ENCOUNTER — Telehealth: Payer: Self-pay | Admitting: *Deleted

## 2017-07-25 NOTE — Telephone Encounter (Signed)
-----   Message from Quintella Reichertraci R Turner, MD sent at 07/20/2017  8:56 PM EDT ----- Please let patient know that they had a successful PAP titration and let DME know that orders are in EPIC.  Please set up 10 week OV with me.

## 2017-07-25 NOTE — Telephone Encounter (Addendum)
Informed patient of sleep study results and patient understanding was verbalized. Patient understands that they had a successful PAP titration and Dr Mayford Knifeurner has ordered her a CPAP. Upon patient request [facility] Patient understands [facility]. Patient understands to call if [facility] does not contact her with new setup in a timely manner. Patient understands they will be called once [facility] have received their new machine to schedule 10 week follow up appointment.  [facility] notified of new cpap order  Please add to airview Patient was grateful for the call and thanked me. .Marland Kitchen

## 2017-07-26 NOTE — Progress Notes (Signed)
Lab works shows that anemia has improved however iron stores are still fairly low.  I would recommend she use an OTC iron supplement to continue to build her iron stores.   Her other labs look ok.

## 2017-07-27 ENCOUNTER — Telehealth: Payer: Self-pay | Admitting: Family Medicine

## 2017-07-27 NOTE — Telephone Encounter (Signed)
Copied from CRM [id number]. Topic: Quick Communication - See Telephone Encounter >> Jul 27, 2017 11:26 AM Trula SladeWalter, Linda F wrote: CRM for notification. See Telephone encounter for: 07/27/17. Patient would like the following medication refilled 1) rivaroxaban (XARELTO) 20 MG TABS tablet  2) omeprazole (PRILOSEC) 20 MG capsule and have them sent to her preferred pharmacy [facility] on the [address] of [address]. [phone]

## 2017-07-28 ENCOUNTER — Other Ambulatory Visit: Payer: Self-pay | Admitting: Family Medicine

## 2017-07-28 MED ORDER — OMEPRAZOLE 20 MG PO CPDR: 20.0000 mg | DELAYED_RELEASE_CAPSULE | Freq: Every day | ORAL | 2 refills | Status: DC

## 2017-07-28 NOTE — Telephone Encounter (Signed)
Dr. Ashley RoyaltyMatthews please advise.   We never send in these rx, Dr. Katrinka BlazingSmith sent in xarelto before.

## 2017-07-28 NOTE — Telephone Encounter (Signed)
I can refill these.

## 2017-07-28 NOTE — Telephone Encounter (Signed)
Left vm for the pt to call back, need to inform the pt rx for Prilosec sent in but Xarelto--Dr. Katrinka BlazingSmith sent in 06/20/2017 90 with 2 refills.

## 2017-07-28 NOTE — Telephone Encounter (Signed)
LOV  07/20/17 Dr. Ashley RoyaltyMatthews No provider associated with medications.

## 2017-07-28 NOTE — Progress Notes (Signed)
Xarelto appears to have been ordered by Dr. Katrinka BlazingSmith as 90 day supply with 2 refills on 06/20/17, she should not be in need of this.  Omeprazole renewed.

## 2017-07-29 NOTE — Telephone Encounter (Signed)
Left another vm for the pt to call back.  

## 2017-07-29 NOTE — Telephone Encounter (Signed)
Disregard opened in error °

## 2017-08-01 NOTE — Telephone Encounter (Signed)
Left another vm for the pt to call back, need to inform the pt of message below.

## 2017-08-01 NOTE — Progress Notes (Signed)
Left multiple vm for the pt to call back, need to inform of message below.

## 2017-09-02 ENCOUNTER — Other Ambulatory Visit: Payer: Self-pay

## 2017-09-02 ENCOUNTER — Encounter (HOSPITAL_COMMUNITY): Payer: Self-pay | Admitting: Emergency Medicine

## 2017-09-02 ENCOUNTER — Emergency Department (HOSPITAL_COMMUNITY): Payer: Medicare Other

## 2017-09-02 ENCOUNTER — Emergency Department (HOSPITAL_COMMUNITY)
Admission: EM | Admit: 2017-09-02 | Discharge: 2017-09-03 | Disposition: A | Discharge status: Home / Self Care | Payer: Medicare Other | Attending: Emergency Medicine | Admitting: Emergency Medicine

## 2017-09-02 DIAGNOSIS — I10 Essential (primary) hypertension: Secondary | ICD-10-CM | POA: Diagnosis not present

## 2017-09-02 DIAGNOSIS — Z7901 Long term (current) use of anticoagulants: Secondary | ICD-10-CM | POA: Diagnosis not present

## 2017-09-02 DIAGNOSIS — Z79899 Other long term (current) drug therapy: Secondary | ICD-10-CM | POA: Insufficient documentation

## 2017-09-02 DIAGNOSIS — R0600 Dyspnea, unspecified: Secondary | ICD-10-CM | POA: Diagnosis not present

## 2017-09-02 DIAGNOSIS — Z87891 Personal history of nicotine dependence: Secondary | ICD-10-CM | POA: Insufficient documentation

## 2017-09-02 DIAGNOSIS — R0602 Shortness of breath: Secondary | ICD-10-CM | POA: Diagnosis present

## 2017-09-02 DIAGNOSIS — I4892 Unspecified atrial flutter: Secondary | ICD-10-CM | POA: Insufficient documentation

## 2017-09-02 DIAGNOSIS — R079 Chest pain, unspecified: Secondary | ICD-10-CM | POA: Diagnosis not present

## 2017-09-02 LAB — BASIC METABOLIC PANEL
Anion gap: 9 (ref 5–15)
BUN: 20 mg/dL (ref 8–23)
CO2: 26 mmol/L (ref 22–32)
Calcium: 9.3 mg/dL (ref 8.9–10.3)
Chloride: 106 mmol/L (ref 98–111)
Creatinine, Ser: 0.83 mg/dL (ref 0.44–1.00)
GFR calc Af Amer: >60 mL/min (ref >60)
GFR calc non Af Amer: >60 mL/min (ref >60)
Glucose, Bld: 117 mg/dL — ABNORMAL HIGH (ref 70–99)
Potassium: 3.8 mmol/L (ref 3.5–5.1)
Sodium: 141 mmol/L (ref 135–145)

## 2017-09-02 LAB — I-STAT TROPONIN, ED: Troponin i, poc: 0 ng/mL (ref 0.00–0.08)

## 2017-09-02 LAB — CBC
HCT: 40 % (ref 36.0–46.0)
Hemoglobin: 13 g/dL (ref 12.0–15.0)
MCH: 27.6 pg (ref 26.0–34.0)
MCHC: 32.5 g/dL (ref 30.0–36.0)
MCV: 84.9 fL (ref 78.0–100.0)
Platelets: 261 10*3/uL (ref 150–400)
RBC: 4.71 MIL/uL (ref 3.87–5.11)
RDW: 16.5 % — ABNORMAL HIGH (ref 11.5–15.5)
WBC: 9.9 10*3/uL (ref 4.0–10.5)

## 2017-09-02 MED ORDER — FENTANYL CITRATE (PF) 100 MCG/2ML IJ SOLN
INTRAMUSCULAR | Status: AC | PRN
  Administered 2017-09-02: 75 ug via INTRAVENOUS

## 2017-09-02 MED ORDER — ETOMIDATE 2 MG/ML IV SOLN
INTRAVENOUS | Status: AC | PRN
  Administered 2017-09-02: 8 mg via INTRAVENOUS

## 2017-09-02 MED ORDER — SODIUM CHLORIDE 0.9 % IV SOLN
INTRAVENOUS | Status: DC
  Administered 2017-09-02: 23:00:00 via INTRAVENOUS

## 2017-09-02 MED ORDER — SODIUM CHLORIDE 0.9% FLUSH: 3.0000 mL | INTRAVENOUS | Status: DC | PRN

## 2017-09-02 MED ORDER — FENTANYL CITRATE (PF) 100 MCG/2ML IJ SOLN
75.0000 ug | INTRAMUSCULAR | Status: DC | PRN
  Filled 2017-09-02: qty 2

## 2017-09-02 MED ORDER — ETOMIDATE 2 MG/ML IV SOLN
8.0000 mg | Freq: Once | INTRAVENOUS | Status: DC
  Filled 2017-09-02: qty 10

## 2017-09-02 NOTE — ED Notes (Signed)
Bed: [id number] Expected date:  Expected time:  Means of arrival:  Comments: Sedating RES B

## 2017-09-02 NOTE — ED Notes (Signed)
Bed: [id number] Expected date:  Expected time:  Means of arrival:  Comments: Sedating RES B

## 2017-09-02 NOTE — ED Notes (Signed)
I stat troponin was performed at  21:53

## 2017-09-02 NOTE — ED Triage Notes (Signed)
Patient states she has hx of a fib and knows her heart rhythm has been out of sync for two days. Patient states she has had sob today and she came on to the ED. Patient states she does not have sob right now.

## 2017-09-05 ENCOUNTER — Telehealth (HOSPITAL_COMMUNITY): Payer: Self-pay | Admitting: *Deleted

## 2017-09-06 NOTE — Telephone Encounter (Signed)
LMOM for pt to clbk to sched appt for ED f/u

## 2017-09-09 NOTE — ED Provider Notes (Addendum)
[facility]-EMERGENCY DEPT Provider Note   CSN: [id number] Arrival date & time: 09/02/17  2021     History   Chief Complaint Chief Complaint  Patient presents with  . Chest Pain  . Shortness of Breath    HPI Mary Poole is a 67 y.o. female.  HPI   67 year old female with some shortness of breath.  She has a past history of atrial fibrillation.  She feels like that she may have gone back into it one day ago.  Symptoms have been persistent since onset.  She began feeling a little bit short of breath throughout the day so came to the emergency room.  Her breathing is actually improved currently.  She denies any acute pain.  She is on Xarelto which she reports compliance.  Past Medical History:  Diagnosis Date  . GERD (gastroesophageal reflux disease)   . H/O atrial flutter   . Hypertension   . Migraine   . Sleep apnea     Patient Active Problem List   Diagnosis Date Noted  . Thyroid disease 07/20/2017  . Atrial flutter (HCC) 06/09/2017  . Iron deficiency anemia due to chronic blood loss   . Normocytic anemia 02/22/2017  . Accessory atrioventricular pathway 09/17/2016  . On continuous oral anticoagulation 09/17/2016  . Snoring 09/17/2016  . Paroxysmal atrial fibrillation (HCC) 09/16/2016  . Essential hypertension 07/19/2016  . Sensorineural hearing loss (SNHL), bilateral 11/20/2015  . Laryngopharyngeal reflux (LPR) 11/20/2015  . Perennial allergic rhinitis 11/20/2015  . Tinnitus, bilateral 11/20/2015  . GERD (gastroesophageal reflux disease) 07/15/2011    Past Surgical History:  Procedure Laterality Date  . COLONOSCOPY N/A 02/24/2017   Procedure: COLONOSCOPY;  Surgeon: Jeani Hawking, MD;  Location: [facility] ENDOSCOPY;  Service: Gastroenterology;  Laterality: N/A;  . ENTEROSCOPY N/A 02/24/2017   Procedure: ENTEROSCOPY;  Surgeon: Jeani Hawking, MD;  Location: [facility] ENDOSCOPY;  Service: Gastroenterology;  Laterality: N/A;  . HERNIA REPAIR    . WISDOM  TOOTH EXTRACTION       OB History   None      Home Medications    Prior to Admission medications   Medication Sig Start Date End Date Taking? Authorizing Provider  carvedilol (COREG) 12.5 MG tablet Take 1 tablet (12.5 mg total) by mouth 2 (two) times daily. 07/11/17 10/09/17  Lyn Records, MD  Ferrous Sulfate (IRON) 325 (65 Fe) MG TABS Take 1 tablet (325 mg total) by mouth 2 (two) times daily. Patient not taking: Reported on 07/20/2017 02/24/17   Maxie Barb, MD  fluticasone [facility]) 50 MCG/ACT nasal spray Place 1 spray into both nostrils 2 (two) times daily. 02/16/17   [provider]  omeprazole (PRILOSEC) 20 MG capsule Take 1 capsule (20 mg total) by mouth daily. 07/28/17   Everrett Coombe, DO  rivaroxaban (XARELTO) 20 MG TABS tablet Take 1 tablet (20 mg total) by mouth daily. Please take after 3 days 04/07/17 07/06/17  Regan Lemming, MD  triamcinolone cream (KENALOG) 0.1 % APPLY TO ITCHY SKIN EVERY DAY AS NEEDED AS DIRECTED 06/12/17   [provider]  XARELTO 20 MG TABS tablet TAKE 1 TABLET BY MOUTH EVERY DAY 06/20/17   Lyn Records, MD    Family History Family History  Problem Relation Age of Onset  . CAD Father   . AAA (abdominal aortic aneurysm) Sister   . Thyroid disease Sister     Social History Social History   Tobacco Use  . Smoking status: Former Games developer  .  Smokeless tobacco: Never Used  Substance Use Topics  . Alcohol use: No  . Drug use: No     Allergies   Diphenhydramine hcl   Review of Systems Review of Systems All systems reviewed and negative, other than as noted in HPI.   Physical Exam Updated Vital Signs BP 136/79   Pulse 75   Temp 98.3 F (36.8 C) (Oral)   Resp 16   Ht 5\' 5"  (1.651 m)   Wt 98.8 kg (217 lb 12.8 oz)   SpO2 100%   BMI 36.24 kg/m   Physical Exam  Constitutional: She appears well-developed and well-nourished. No distress.  HENT:  Head: Normocephalic and atraumatic.  Eyes: Conjunctivae are  normal. Right eye exhibits no discharge. Left eye exhibits no discharge.  Neck: Neck supple.  Cardiovascular: Normal heart sounds. Exam reveals no gallop and no friction rub.  No murmur heard. tachcyardic  Pulmonary/Chest: Effort normal and breath sounds normal. No respiratory distress.  Abdominal: Soft. She exhibits no distension. There is no tenderness.  Musculoskeletal: She exhibits no edema or tenderness.  Neurological: She is alert.  Skin: Skin is warm and dry.  Psychiatric: She has a normal mood and affect. Her behavior is normal. Thought content normal.  Nursing note and vitals reviewed.    ED Treatments / Results  Labs (all labs ordered are listed, but only abnormal results are displayed) Labs Reviewed  BASIC METABOLIC PANEL - Abnormal; Notable for the following components:      Result Value   Glucose, Bld 117 (*)    All other components within normal limits  CBC - Abnormal; Notable for the following components:   RDW 16.5 (*)    All other components within normal limits  I-STAT TROPONIN, ED    EKG EKG Interpretation  Date/Time:  Friday September 02 2017 22:52:53 EDT Ventricular Rate:  94 PR Interval:    QRS Duration: 103 QT Interval:  341 QTC Calculation: 427 R Axis:   47 Text Interpretation:  Sinus rhythm Short PR interval Abnormal R-wave progression, early transition Inferior infarct, old Repol abnrm, severe global ischemia (LM/MVD) similar to 1/19 Confirmed by Meridee Score [phone]) on 09/03/2017 10:31:47 AM   Radiology No results found.  Procedures  CRITICAL CARE Performed by: Raeford Razor Total critical care time: 35 minutes Critical care time was exclusive of separately billable procedures and treating other patients. Critical care was necessary to treat or prevent imminent or life-threatening deterioration. Critical care was time spent personally by me on the following activities: development of treatment plan with patient and/or surrogate as well as  nursing, discussions with consultants, evaluation of patient's response to treatment, examination of patient, obtaining history from patient or surrogate, ordering and performing treatments and interventions, ordering and review of laboratory studies, ordering and review of radiographic studies, pulse oximetry and re-evaluation of patient's condition.   .Cardioversion Date/Time: 09/09/2017 5:27 PM Performed by: Raeford Razor, MD Authorized by: Raeford Razor, MD   Consent:    Consent obtained:  Verbal and written   Consent given by:  Patient   Risks discussed:  Cutaneous burn, induced arrhythmia and pain   Alternatives discussed:  Rate-control medication, delayed treatment and referral Pre-procedure details:    Cardioversion basis:  Elective   Rhythm:  Atrial flutter   Electrode placement:  Anterior-posterior Patient sedated: Yes. Refer to sedation procedure documentation for details of sedation.  Attempt one:    Cardioversion mode:  Synchronous   Waveform:  Biphasic   Shock (Joules):  120  Shock outcome:  Conversion to normal sinus rhythm Post-procedure details:    Patient status:  Awake   Patient tolerance of procedure:  Tolerated well, no immediate complications .Sedation Date/Time: 09/09/2017 5:28 PM Performed by: Raeford RazorKohut, Starkisha Tullis, MD Authorized by: Raeford RazorKohut, Linden Wenzlick, MD   Consent:    Consent obtained:  Verbal   Consent given by:  Patient   Risks discussed:  Allergic reaction, dysrhythmia, inadequate sedation, nausea, prolonged hypoxia resulting in organ damage, prolonged sedation necessitating reversal, respiratory compromise necessitating ventilatory assistance and intubation and vomiting   Alternatives discussed:  Analgesia without sedation, anxiolysis and regional anesthesia Universal protocol:    Procedure explained and questions answered to patient or proxy's satisfaction: yes     Relevant documents present and verified: yes     Test results available and properly labeled:  yes     Imaging studies available: yes     Required blood products, implants, devices, and special equipment available: yes     Site/side marked: yes     Immediately prior to procedure a time out was called: yes     Patient identity confirmation method:  Verbally with patient Indications:    Procedure necessitating sedation performed by:  Physician performing sedation   Intended level of sedation:  Deep Pre-sedation assessment:    Time since last food or drink:  .   ASA classification: class 2 - patient with mild systemic disease     Neck mobility: normal     Mouth opening:  3 or more finger widths   Thyromental distance:  3 finger widths   Mallampati score:  II - soft palate, uvula, fauces visible   Pre-sedation assessments completed and reviewed: airway patency, cardiovascular function, hydration status, mental status, nausea/vomiting, pain level, respiratory function and temperature   Immediate pre-procedure details:    Reassessment: Patient reassessed immediately prior to procedure     Reviewed: vital signs, relevant labs/tests and NPO status     Verified: bag valve mask available, emergency equipment available, intubation equipment available, IV patency confirmed, oxygen available and suction available   Procedure details (see MAR for exact dosages):    Preoxygenation:  Nasal cannula   Sedation:  Etomidate   Analgesia:  Fentanyl   Intra-procedure monitoring:  Blood pressure monitoring, cardiac monitor, continuous pulse oximetry, frequent LOC assessments, frequent vital sign checks and continuous capnometry   Intra-procedure events: none     Intra-procedure management:  Supplemental oxygen   Total Provider sedation time (minutes):  31 Post-procedure details:    Attendance: Constant attendance by certified staff until patient recovered     Recovery: Patient returned to pre-procedure baseline     Post-sedation assessments completed and reviewed: airway patency, cardiovascular  function, hydration status, mental status, nausea/vomiting, pain level, respiratory function and temperature     Patient is stable for discharge or admission: yes     Patient tolerance:  Tolerated well, no immediate complications   (including critical care time)   Medications Ordered in ED Medications  etomidate (AMIDATE) injection (8 mg Intravenous Given 09/02/17 2252)  fentaNYL (SUBLIMAZE) injection (75 mcg Intravenous Given 09/02/17 2252)     Initial Impression / Assessment and Plan / ED Course  I have reviewed the triage vital signs and the nursing notes.  Pertinent labs & imaging results that were available during my care of the patient were reviewed by me and considered in my medical decision making (see chart for details).     67 year old female with A. fib/flutter.  History of the same.  She is on Xarelto and reports compliance without any missed doses for the past several weeks.  She was cardioverted back to sinus rhythm.  He feels better.  ED work-up fairly unremarkable.  Outpatient follow-up with cardiology.  Return precautions discussed.  Final Clinical Impressions(s) / ED Diagnoses   Final diagnoses:  Atrial flutter, unspecified type [facility])  Dyspnea, unspecified type    ED Discharge Orders    None       Raeford Razor, MD 09/09/17 1729    Raeford Razor, MD 09/29/17 1424

## 2017-09-17 ENCOUNTER — Observation Stay (HOSPITAL_COMMUNITY)
Admission: EM | Admit: 2017-09-17 | Discharge: 2017-09-18 | Disposition: A | Discharge status: Home / Self Care | Payer: Medicare Other | Attending: Family Medicine | Admitting: Family Medicine

## 2017-09-17 ENCOUNTER — Encounter (HOSPITAL_COMMUNITY): Payer: Self-pay | Admitting: Emergency Medicine

## 2017-09-17 ENCOUNTER — Emergency Department (HOSPITAL_COMMUNITY): Payer: Medicare Other

## 2017-09-17 ENCOUNTER — Other Ambulatory Visit: Payer: Self-pay

## 2017-09-17 DIAGNOSIS — R002 Palpitations: Secondary | ICD-10-CM

## 2017-09-17 DIAGNOSIS — E079 Disorder of thyroid, unspecified: Secondary | ICD-10-CM

## 2017-09-17 DIAGNOSIS — G4733 Obstructive sleep apnea (adult) (pediatric): Secondary | ICD-10-CM | POA: Diagnosis not present

## 2017-09-17 DIAGNOSIS — I081 Rheumatic disorders of both mitral and tricuspid valves: Secondary | ICD-10-CM | POA: Insufficient documentation

## 2017-09-17 DIAGNOSIS — R7989 Other specified abnormal findings of blood chemistry: Secondary | ICD-10-CM | POA: Insufficient documentation

## 2017-09-17 DIAGNOSIS — I10 Essential (primary) hypertension: Secondary | ICD-10-CM | POA: Diagnosis not present

## 2017-09-17 DIAGNOSIS — K219 Gastro-esophageal reflux disease without esophagitis: Secondary | ICD-10-CM | POA: Diagnosis not present

## 2017-09-17 DIAGNOSIS — Z888 Allergy status to other drugs, medicaments and biological substances status: Secondary | ICD-10-CM | POA: Diagnosis not present

## 2017-09-17 DIAGNOSIS — Z7901 Long term (current) use of anticoagulants: Secondary | ICD-10-CM | POA: Insufficient documentation

## 2017-09-17 DIAGNOSIS — I272 Pulmonary hypertension, unspecified: Secondary | ICD-10-CM | POA: Insufficient documentation

## 2017-09-17 DIAGNOSIS — Z79899 Other long term (current) drug therapy: Secondary | ICD-10-CM | POA: Insufficient documentation

## 2017-09-17 DIAGNOSIS — Z87891 Personal history of nicotine dependence: Secondary | ICD-10-CM | POA: Diagnosis not present

## 2017-09-17 DIAGNOSIS — I4891 Unspecified atrial fibrillation: Secondary | ICD-10-CM | POA: Diagnosis present

## 2017-09-17 DIAGNOSIS — I491 Atrial premature depolarization: Secondary | ICD-10-CM | POA: Insufficient documentation

## 2017-09-17 DIAGNOSIS — I110 Hypertensive heart disease with heart failure: Secondary | ICD-10-CM | POA: Insufficient documentation

## 2017-09-17 DIAGNOSIS — R0600 Dyspnea, unspecified: Secondary | ICD-10-CM | POA: Diagnosis not present

## 2017-09-17 DIAGNOSIS — I509 Heart failure, unspecified: Secondary | ICD-10-CM | POA: Diagnosis not present

## 2017-09-17 DIAGNOSIS — I4892 Unspecified atrial flutter: Secondary | ICD-10-CM | POA: Diagnosis not present

## 2017-09-17 DIAGNOSIS — I480 Paroxysmal atrial fibrillation: Principal | ICD-10-CM

## 2017-09-17 DIAGNOSIS — R001 Bradycardia, unspecified: Secondary | ICD-10-CM

## 2017-09-17 DIAGNOSIS — Z8249 Family history of ischemic heart disease and other diseases of the circulatory system: Secondary | ICD-10-CM | POA: Insufficient documentation

## 2017-09-17 LAB — CBC WITH DIFFERENTIAL/PLATELET
BASOS PCT: 0 %
Basophils Absolute: 0 10*3/uL (ref 0.0–0.1)
EOS PCT: 1 %
Eosinophils Absolute: 0.1 10*3/uL (ref 0.0–0.7)
HEMATOCRIT: 40.7 % (ref 36.0–46.0)
Hemoglobin: 13 g/dL (ref 12.0–15.0)
Lymphocytes Relative: 28 %
Lymphs Abs: 2.7 10*3/uL (ref 0.7–4.0)
MCH: 27.3 pg (ref 26.0–34.0)
MCHC: 31.9 g/dL (ref 30.0–36.0)
MCV: 85.3 fL (ref 78.0–100.0)
MONO ABS: 0.4 10*3/uL (ref 0.1–1.0)
MONOS PCT: 4 %
NEUTROS ABS: 6.2 10*3/uL (ref 1.7–7.7)
Neutrophils Relative %: 67 %
PLATELETS: 274 10*3/uL (ref 150–400)
RBC: 4.77 MIL/uL (ref 3.87–5.11)
RDW: 16.4 % — ABNORMAL HIGH (ref 11.5–15.5)
WBC: 9.4 10*3/uL (ref 4.0–10.5)

## 2017-09-17 LAB — I-STAT CHEM 8, ED
BUN: 17 mg/dL (ref 8–23)
CALCIUM ION: 1.23 mmol/L (ref 1.15–1.40)
CHLORIDE: 105 mmol/L (ref 98–111)
Creatinine, Ser: 1 mg/dL (ref 0.44–1.00)
GLUCOSE: 116 mg/dL — AB (ref 70–99)
HCT: 42 % (ref 36.0–46.0)
Hemoglobin: 14.3 g/dL (ref 12.0–15.0)
Potassium: 4 mmol/L (ref 3.5–5.1)
SODIUM: 143 mmol/L (ref 135–145)
TCO2: 29 mmol/L (ref 22–32)

## 2017-09-17 MED ORDER — DILTIAZEM LOAD VIA INFUSION
25.0000 mg | Freq: Once | INTRAVENOUS | Status: AC
Start: 2017-09-17 — End: 2017-09-17
  Administered 2017-09-17: 25 mg via INTRAVENOUS
  Filled 2017-09-17: qty 25

## 2017-09-17 MED ORDER — ETOMIDATE 2 MG/ML IV SOLN
INTRAVENOUS | Status: AC | PRN
  Administered 2017-09-17: 10 mg via INTRAVENOUS

## 2017-09-17 MED ORDER — FENTANYL CITRATE (PF) 100 MCG/2ML IJ SOLN
INTRAMUSCULAR | Status: AC | PRN
  Administered 2017-09-17: 75 ug via INTRAVENOUS

## 2017-09-17 MED ORDER — ETOMIDATE 2 MG/ML IV SOLN
0.3000 mg/kg | Freq: Once | INTRAVENOUS | Status: AC
  Administered 2017-09-17: 28.98 mg via INTRAVENOUS
  Filled 2017-09-17: qty 20

## 2017-09-17 MED ORDER — FENTANYL CITRATE (PF) 100 MCG/2ML IJ SOLN
75.0000 ug | Freq: Once | INTRAMUSCULAR | Status: AC
  Administered 2017-09-17: 75 ug via INTRAVENOUS
  Filled 2017-09-17: qty 2

## 2017-09-17 MED ORDER — DILTIAZEM LOAD VIA INFUSION
15.0000 mg | Freq: Once | INTRAVENOUS | Status: AC
  Administered 2017-09-17: 15 mg via INTRAVENOUS
  Filled 2017-09-17: qty 15

## 2017-09-17 MED ORDER — DILTIAZEM HCL-DEXTROSE 100-5 MG/100ML-% IV SOLN (PREMIX)
5.0000 mg/h | INTRAVENOUS | Status: DC
  Administered 2017-09-17: 5 mg/h via INTRAVENOUS
  Filled 2017-09-17: qty 100

## 2017-09-17 MED ORDER — SODIUM CHLORIDE 0.9 % IV BOLUS
1000.0000 mL | Freq: Once | INTRAVENOUS | Status: AC
  Administered 2017-09-17: 1000 mL via INTRAVENOUS

## 2017-09-17 NOTE — ED Notes (Signed)
Patient transported to X-ray 

## 2017-09-17 NOTE — H&P (Signed)
Mary Poole:[id number] DOB: 1950-12-24 DOA: 09/17/2017     PCP: Everrett Coombe, DO   Outpatient Specialists:   CARDS:  Dr. Dr. Mayford Knife, Verdis Prime   GI Dr. Elnoria Howard Patient arrived to ER on 09/17/17 at 2004  Patient coming from:   home Lives   With family   Chief Complaint:  Chief Complaint  Patient presents with  . Palpitations  . Shortness of Breath    HPI: Mary Poole is a 67 y.o. female with medical history significant of a.flutter/fib on anticoagulation, GERD, HTN, OSA,GERD history of blood loss anemia in the past    Presented with   palpitations and shortness of breath not associated chest pain started 10 minutes prior to arrival started while she was watching home TV. She has been eating healthy but today she had a handful of Barbeque chips. She feels that last time it was the chips that set it off. She also has been rubbing her self with Magnesium. She has been taking  Hawthorn supplements.  Of note 3 weeks ago patient was cardioverted in the emergency department was able to be discharged in normal sinus rhythm she is currently on Xarelto and carvedilol 25 a day and has been taking her medications.  Regarding pertinent Chronic problems: History of sleep apnea followed by cardiology Dr. Mayford Knife ordered CPAP a .flutter/fib with one-to-one AV conduction in the past heart rate has been as high as 220 CHADS VASC score is 3. On Coreg and Xarelto While in ER: Patient was noted to be in A. fib with vagal maneuvers with no improvement 9:50 PM I Etomidate and fentanyl was given and synchronized cardioversion was attempted patient was back to sinus rhythm   but returned to A. fib after first cardioversion patient had another attempt but still returned back to A. fib she was given diltiazem bolus first 10 mg with no conversion and 25 mg and then started on diltiazem drip at 10 mics per hour rate became controlled after which patient became bradycardic in sinus bradycardia between  40-50. Diltiazem drip was discontinued   The following Work up has been ordered so far:  Orders Placed This Encounter  Procedures  . Critical Care  . ELECTRICAL CARDIOVERSION  . Sedation procedure  . DG Chest 2 View  . CBC with Differential  . Comprehensive metabolic panel  . If O2 Sat <94% administer O2 at 2 liters/minute via nasal cannula  . Inpatient consult to Cardiology  [id number]  . Consult to hospitalist  . Pulse oximetry, continuous  . I-Stat Chem 8, ED  . ED EKG  . EKG 12-Lead  . EKG 12-Lead  . EKG 12-Lead  . Insert peripheral IV     Following Medications were ordered in ER: Medications  diltiazem (CARDIZEM) 1 mg/mL load via infusion 15 mg (15 mg Intravenous Bolus from Bag 09/17/17 2236)    And  diltiazem (CARDIZEM) 100 mg in dextrose 5% (1 mg/mL) infusion (10 mg/hr Intravenous Rate/Dose Change 09/17/17 2319)  sodium chloride 0.9 % bolus 1,000 mL (0 mLs Intravenous Stopped 09/17/17 2217)  etomidate (AMIDATE) injection 28.98 mg (28.98 mg Intravenous Given 09/17/17 2201)  fentaNYL (SUBLIMAZE) injection 75 mcg (75 mcg Intravenous Given 09/17/17 2201)  fentaNYL (SUBLIMAZE) injection (75 mcg Intravenous Given 09/17/17 2201)  etomidate (AMIDATE) injection (10 mg Intravenous Given 09/17/17 2201)  diltiazem (CARDIZEM) 1 mg/mL load via infusion 25 mg (25 mg Intravenous Bolus from Bag 09/17/17 2318)    Significant initial  Findings: Abnormal Labs  Reviewed  I-STAT CHEM 8, ED - Abnormal; Notable for the following components:      Result Value   Glucose, Bld 116 (*)    All other components within normal limits     Na 143 K 4.0  Cr  stable,  Up from baseline see below Lab Results  Component Value Date   CREATININE 1.00 09/17/2017   CREATININE 0.83 09/02/2017   CREATININE 0.88 07/20/2017      WBC  9.4  HG/HCT  Stable,     Component Value Date/Time   HGB 14.3 09/17/2017 2123   HCT 42.0 09/17/2017 2123       Troponin (Point of Care Test) No results for  input(s): TROPIPOC in the last 72 hours.     UA not ordered    CXR - mild central congestion    ECG:  Personally reviewed by me showing: HR : 125 Rhythm: A.fib. RVR Nonspecific abnormalities with repolarization QTC 294       ED Triage Vitals  Enc Vitals Group     BP 09/17/17 2010 (!) 173/120     Pulse Rate 09/17/17 2010 (!) 130     Resp 09/17/17 2010 (!) 24     Temp 09/17/17 2010 97.7 F (36.5 C)     Temp src --      SpO2 09/17/17 2010 97 %     Weight 09/17/17 2034 213 lb (96.6 kg)     Height 09/17/17 2034 5\' 5"  (1.651 m)     Head Circumference --      Peak Flow --      Pain Score 09/17/17 2008 0     Pain Loc --      Pain Edu? --      Excl. in GC? --   TMAX(24)@       Latest  Blood pressure (!) 156/95, pulse (!) 135, temperature 97.7 F (36.5 C), resp. rate 14, height 5\' 5"  (1.651 m), weight 97.5 kg, SpO2 100 %.  Hospitalist was called for admission for A.fib w RVR   Review of Systems:    Pertinent positives include:  palpitations  Constitutional:  No weight loss, night sweats, Fevers, chills, fatigue, weight loss  HEENT:  No headaches, Difficulty swallowing,Tooth/dental problems,Sore throat,  No sneezing, itching, ear ache, nasal congestion, post nasal drip,  Cardio-vascular:  No chest pain, Orthopnea, PND, anasarca, dizziness, palpitations.no Bilateral lower extremity swelling  GI:  No heartburn, indigestion, abdominal pain, nausea, vomiting, diarrhea, change in bowel habits, loss of appetite, melena, blood in stool, hematemesis Resp:  no shortness of breath at rest. No dyspnea on exertion, No excess mucus, no productive cough, No non-productive cough, No coughing up of blood.No change in color of mucus.No wheezing. Skin:  no rash or lesions. No jaundice GU:  no dysuria, change in color of urine, no urgency or frequency. No straining to urinate.  No flank pain.  Musculoskeletal:  No joint pain or no joint swelling. No decreased range of motion.  No back pain.  Psych:  No change in mood or affect. No depression or anxiety. No memory loss.  Neuro: no localizing neurological complaints, no tingling, no weakness, no double vision, no gait abnormality, no slurred speech, no confusion  All systems reviewed and apart from HOPI all are negative  Past Medical History:   Past Medical History:  Diagnosis Date  . GERD (gastroesophageal reflux disease)   . H/O atrial flutter   . Hypertension   . Migraine   . Sleep apnea  Past Surgical History:  Procedure Laterality Date  . COLONOSCOPY N/A 02/24/2017   Procedure: COLONOSCOPY;  Surgeon: Jeani Hawking, MD;  Location: [facility] ENDOSCOPY;  Service: Gastroenterology;  Laterality: N/A;  . ENTEROSCOPY N/A 02/24/2017   Procedure: ENTEROSCOPY;  Surgeon: Jeani Hawking, MD;  Location: [facility] ENDOSCOPY;  Service: Gastroenterology;  Laterality: N/A;  . HERNIA REPAIR    . WISDOM TOOTH EXTRACTION      Social History:  Ambulatory   independently       reports that she has quit smoking. She has never used smokeless tobacco. She reports that she does not drink alcohol or use drugs.     Family History:   Family History  Problem Relation Age of Onset  . CAD Father   . AAA (abdominal aortic aneurysm) Sister   . Thyroid disease Sister     Allergies: Allergies  Allergen Reactions  . Diphenhydramine Hcl Other (See Comments)     Prior to Admission medications   Medication Sig Start Date End Date Taking? Authorizing Provider  carvedilol (COREG) 12.5 MG tablet Take 1 tablet (12.5 mg total) by mouth 2 (two) times daily. 07/11/17 10/09/17 Yes Lyn Records, MD  HAWTHORNE BERRY PO Take 5 mLs by mouth daily.   Yes [provider]  Magnesium Oxide-Asafoetida 250-6 MG-MIN/5ML LIQD Take 5 mLs by mouth daily.   Yes [provider]  omeprazole (PRILOSEC) 20 MG capsule Take 1 capsule (20 mg total) by mouth daily. 07/28/17  Yes Everrett Coombe, DO  rivaroxaban (XARELTO) 20 MG TABS tablet Take  1 tablet (20 mg total) by mouth daily. Please take after 3 days 04/07/17 09/17/17 Yes Camnitz, Andree Coss, MD  triamcinolone cream (KENALOG) 0.1 % Apply 1 application topically 2 (two) times daily.  06/12/17  Yes [provider]  Ferrous Sulfate (IRON) 325 (65 Fe) MG TABS Take 1 tablet (325 mg total) by mouth 2 (two) times daily. Patient not taking: Reported on 07/20/2017 02/24/17   Maxie Barb, MD  fluticasone [facility]) 50 MCG/ACT nasal spray Place 1 spray into both nostrils 2 (two) times daily. 02/16/17   [provider]  XARELTO 20 MG TABS tablet TAKE 1 TABLET BY MOUTH EVERY DAY Patient not taking: Reported on 09/17/2017 06/20/17   Lyn Records, MD   Physical Exam: Blood pressure (!) 156/95, pulse (!) 135, temperature 97.7 F (36.5 C), resp. rate 14, height 5\' 5"  (1.651 m), weight 97.5 kg, SpO2 100 %. 1. General:  in No Acute distress  well  -appearing 2. Psychological: Alert and Oriented 3. Head/ENT:     Dry Mucous Membranes                          Head Non traumatic, neck supple                            Poor Dentition 4. SKIN:   decreased Skin turgor,  Skin clean Dry and intact no rash 5. Heart: Regular rate and rhythm no  Murmur, no Rub or gallop 6. Lungs: no wheezes or crackles   7. Abdomen: Soft,  non-tender, Non distended  obese   8. Lower extremities: no clubbing, cyanosis, or  edema 9. Neurologically Grossly intact, moving all 4 extremities equally   10. MSK: Normal range of motion   LABS:     Recent Labs  Lab 09/17/17 2123  HGB 14.3  HCT 42.0   Basic Metabolic Panel:  Recent Labs  Lab 09/17/17 2123  NA 143  K 4.0  CL 105  GLUCOSE 116*  BUN 17  CREATININE 1.00    Urine analysis:    Cultures: No results found for: SDES, SPECREQUEST, CULT, REPTSTATUS   Radiological Exams on Admission: Dg Chest 2 View  Result Date: 09/17/2017 CLINICAL DATA:  Dyspnea with chest palpitations. History of atrial fibrillation and atrial flutter. EXAM:  CHEST - 2 VIEW COMPARISON:  None. FINDINGS: Top-normal heart size. Nonaneurysmal thoracic aorta. Mild pulmonary vascular congestion without acute pulmonary consolidation, effusion or pneumothorax. No acute osseous abnormality. IMPRESSION: Mild central vascular congestion without acute pulmonary consolidation. Electronically Signed   By: Tollie Eth M.D.   On: 09/17/2017 20:38    Chart has been reviewed    Assessment/Plan  67 y.o. female with medical history significant of a.flutter/fib coagulation, GERD, HTN, OSA,GERD   Admitted for code to manage A. fib with RVR failing cardioversion x2 in ER  Present on Admission: . Atrial fibrillation with RVR (HCC) failed cardioversion while in the emergency department, was given diltiazem 10 mg bolus followed by 25 mg bolus and started in drip currently converted to sinus but became bradycardic down to 40s -50s. Discontinue diltiazem drip for now.  If heart rate improves and stabilizes above 55-65 will restart home medications including Coreg. Patient would benefit from follow-up with cardiology Continue Xarelto If atrial fibrillation recurs and blood pressure stable would consider restarting diltiazem drip without bolus to see if that would control heart rate but does not lead to bradycardia CHADS VASC score is 3. rEHYDRATE  . Essential hypertension currently soft blood pressures we will restart Coreg if able to tolerate in a.m.  Marland Kitchen GERD (gastroesophageal reflux disease) stable continue home medication  . Thyroid disease check TSH    Use of HawThorn- advised patient that this has not been properly studied in treatment of A.fib  Hawthorn (Crataegus oxycantha) Extracts from the flowers, leaves and berries of this plant are in use for a long time in the treatment of cardiovascular conditions (54). The extracts of this plant contain pharmacologically active flavonoids and procyanidines which exert various actions on the cardiovascular system (55). It is  believed to prolong the action potential duration by inhibiting the delayed and inward rectifier potassium current thus causing negative chronotropic effect (56). Additionally it is also believed to inhibit the beta-adrenergic effects (55). It also has positive ionotropic action which is exerted by inhibiting the Na-K ATPase activity and phosphodiesterase enzyme (55). The hydroalcoholic extracts of hawthorn have largely been used as a complementary therapy in heart failure treatment. The most commonly studied preparations of this compound are WS 1442 and LI 132 (57). Use of this compound resulted in improvement in exercise capacity and also decreased mortality in heart failure patients (58,59). Although it has class III anti-arrhythmic properties, its use in the management of atrial arrhythmias has not been systematically evaluated. Furthermore, it inhibits the biosynthesis of thromboxane A2 and may thereby increase the antithrombotic effect of antiplatelet agents and increase the risk of bleeding (60). There is some concern of interaction with digitalis through its effects on P-glycoprotein function; however, a large retrospective study did not find any evidence of major interaction with any drugs (61,62). In spite of its potential benefits, lack of clinical studies evaluating its use in AF precludes its use in the management of this condition. Patients should be cautioned against the use of this preparation if they are concomitantly using antiplatelet and or anticoagulant agents due to  its potential for inhibiting thromboxane A2 J Thorac Dis. 2015 Feb; 7(2): 185-192. doi: 10.3978/j.issn.[id number] PMCID: [id number] PMID: 09811914 Alternative medicine in atrial fibrillation treatment-Yoga, acupuncture, biofeedback and more Georg Ruddle   Other plan as per orders.  DVT prophylaxis:  Xarelto    Code Status:  FULL CODE as per patient   I had personally discussed CODE STATUS with patient      Family Communication:   Family not  at  Bedside    Disposition Plan  To home once workup is complete and patient is stable              Consults called: Please consult cardiology in AM  Admission status:   obs   Level of care    SDU     Shaunda Tipping 09/17/2017, 12:45 AM    [facility]  Pager [phone]   after 2 AM please page floor coverage PA If 7AM-7PM, please contact the day team taking care of the patient  [email or web address]  Password [email or web address]

## 2017-09-17 NOTE — ED Triage Notes (Signed)
Patient c/o palpitations with SOB x10 minutes. Hx afib and aflutter. Denies chest pain.

## 2017-09-17 NOTE — ED Provider Notes (Signed)
[facility]-EMERGENCY DEPT Provider Note   CSN: [id number] Arrival date & time: 09/17/17  2004     History   Chief Complaint Chief Complaint  Patient presents with  . Palpitations  . Shortness of Breath    HPI TORIANNE LAFLAM is a 67 y.o. female.  67 y/o female with a PMH of aFib presents to the ED with a chief complaint of palpitations.  She states she was watching TV at home eating barbecue chips when she felt the palpitations came on. Patient was cardioverted in the ED 3 weeks ago and was discharged home in NSR. Patient reports she tried cold water but this did not help the palpitations. She is currently on anticoagulation therapy, xarelto 25 mg daily and rate control with carvedilol 25 daily she reports being complaint with medication. She denies any chest pain, shortness of breath, abdominal pain or headache.      Past Medical History:  Diagnosis Date  . GERD (gastroesophageal reflux disease)   . H/O atrial flutter   . Hypertension   . Migraine   . Sleep apnea     Patient Active Problem List   Diagnosis Date Noted  . Atrial fibrillation with RVR (HCC) 09/18/2017  . Thyroid disease 07/20/2017  . Atrial flutter (HCC) 06/09/2017  . Iron deficiency anemia due to chronic blood loss   . Normocytic anemia 02/22/2017  . Accessory atrioventricular pathway 09/17/2016  . On continuous oral anticoagulation 09/17/2016  . Snoring 09/17/2016  . Paroxysmal atrial fibrillation (HCC) 09/16/2016  . Essential hypertension 07/19/2016  . Sensorineural hearing loss (SNHL), bilateral 11/20/2015  . Laryngopharyngeal reflux (LPR) 11/20/2015  . Perennial allergic rhinitis 11/20/2015  . Tinnitus, bilateral 11/20/2015  . GERD (gastroesophageal reflux disease) 07/15/2011    Past Surgical History:  Procedure Laterality Date  . COLONOSCOPY N/A 02/24/2017   Procedure: COLONOSCOPY;  Surgeon: Jeani Hawking, MD;  Location: [facility] ENDOSCOPY;  Service: Gastroenterology;   Laterality: N/A;  . ENTEROSCOPY N/A 02/24/2017   Procedure: ENTEROSCOPY;  Surgeon: Jeani Hawking, MD;  Location: [facility] ENDOSCOPY;  Service: Gastroenterology;  Laterality: N/A;  . HERNIA REPAIR    . WISDOM TOOTH EXTRACTION       OB History   None      Home Medications    Prior to Admission medications   Medication Sig Start Date End Date Taking? Authorizing Provider  carvedilol (COREG) 12.5 MG tablet Take 1 tablet (12.5 mg total) by mouth 2 (two) times daily. 07/11/17 10/09/17 Yes Lyn Records, MD  HAWTHORNE BERRY PO Take 5 mLs by mouth daily.   Yes [provider]  Magnesium Oxide-Asafoetida 250-6 MG-MIN/5ML LIQD Take 5 mLs by mouth daily.   Yes [provider]  omeprazole (PRILOSEC) 20 MG capsule Take 1 capsule (20 mg total) by mouth daily. 07/28/17  Yes Everrett Coombe, DO  rivaroxaban (XARELTO) 20 MG TABS tablet Take 1 tablet (20 mg total) by mouth daily. Please take after 3 days 04/07/17 09/17/17 Yes Camnitz, Andree Coss, MD  triamcinolone cream (KENALOG) 0.1 % Apply 1 application topically 2 (two) times daily.  06/12/17  Yes [provider]  Ferrous Sulfate (IRON) 325 (65 Fe) MG TABS Take 1 tablet (325 mg total) by mouth 2 (two) times daily. Patient not taking: Reported on 07/20/2017 02/24/17   Maxie Barb, MD  fluticasone [facility]) 50 MCG/ACT nasal spray Place 1 spray into both nostrils 2 (two) times daily. 02/16/17   [provider]  XARELTO 20 MG TABS tablet TAKE 1 TABLET  BY MOUTH EVERY DAY Patient not taking: Reported on 09/17/2017 06/20/17   Lyn Records, MD    Family History Family History  Problem Relation Age of Onset  . CAD Father   . AAA (abdominal aortic aneurysm) Sister   . Thyroid disease Sister     Social History Social History   Tobacco Use  . Smoking status: Former Games developer  . Smokeless tobacco: Never Used  Substance Use Topics  . Alcohol use: No  . Drug use: No     Allergies   Diphenhydramine hcl   Review of  Systems Review of Systems  Constitutional: Negative for chills and fever.  HENT: Negative for ear pain and sore throat.   Eyes: Negative for pain and visual disturbance.  Respiratory: Negative for cough, chest tightness and shortness of breath.   Cardiovascular: Positive for palpitations. Negative for chest pain and leg swelling.  Gastrointestinal: Negative for abdominal pain, nausea and vomiting.  Genitourinary: Negative for dysuria and hematuria.  Musculoskeletal: Negative for arthralgias and back pain.  Skin: Negative for color change and rash.  Neurological: Negative for seizures and syncope.  All other systems reviewed and are negative.    Physical Exam Updated Vital Signs BP (!) 156/95   Pulse (!) 135   Temp 97.7 F (36.5 C)   Resp 14   Ht 5\' 5"  (1.651 m)   Wt 97.5 kg   SpO2 100%   BMI 35.78 kg/m   Physical Exam  Constitutional: She is oriented to person, place, and time. She appears well-developed and well-nourished. No distress.  HENT:  Head: Normocephalic and atraumatic.  Mouth/Throat: Oropharynx is clear and moist. No oropharyngeal exudate.  Eyes: Pupils are equal, round, and reactive to light.  Neck: Normal range of motion.  Cardiovascular: An irregularly irregular rhythm present.  Pulmonary/Chest: Effort normal and breath sounds normal. No respiratory distress.  Abdominal: Soft. Bowel sounds are normal. She exhibits no distension. There is no tenderness.  Musculoskeletal: She exhibits no tenderness or deformity.       Right lower leg: She exhibits no edema.       Left lower leg: She exhibits no edema.  Neurological: She is alert and oriented to person, place, and time.  Skin: Skin is warm and dry. Capillary refill takes less than 2 seconds.  Psychiatric: She has a normal mood and affect.  Nursing note and vitals reviewed.    ED Treatments / Results  Labs (all labs ordered are listed, but only abnormal results are displayed) Labs Reviewed  CBC WITH  DIFFERENTIAL/PLATELET - Abnormal; Notable for the following components:      Result Value   RDW 16.4 (*)    All other components within normal limits  COMPREHENSIVE METABOLIC PANEL - Abnormal; Notable for the following components:   Glucose, Bld 138 (*)    AST 67 (*)    ALT 67 (*)    All other components within normal limits  I-STAT CHEM 8, ED - Abnormal; Notable for the following components:   Glucose, Bld 116 (*)    All other components within normal limits    EKG EKG Interpretation  Date/Time:  Saturday September 17 2017 22:06:42 EDT Ventricular Rate:  85 PR Interval:    QRS Duration: 110 QT Interval:  364 QTC Calculation: 433 R Axis:   65 Text Interpretation:  Ectopic atrial rhythm Atrial premature complex Inferolateral infarct, age indeterminate Confirmed by Marily Memos [phone]) on 09/17/2017 11:31:25 PM   Radiology Dg Chest 2 View  Result  Date: 09/17/2017 CLINICAL DATA:  Dyspnea with chest palpitations. History of atrial fibrillation and atrial flutter. EXAM: CHEST - 2 VIEW COMPARISON:  None. FINDINGS: Top-normal heart size. Nonaneurysmal thoracic aorta. Mild pulmonary vascular congestion without acute pulmonary consolidation, effusion or pneumothorax. No acute osseous abnormality. IMPRESSION: Mild central vascular congestion without acute pulmonary consolidation. Electronically Signed   By: Tollie Ethavid  Kwon M.D.   On: 09/17/2017 20:38    Procedures Procedures (including critical care time)  Medications Ordered in ED Medications  diltiazem (CARDIZEM) 1 mg/mL load via infusion 15 mg (15 mg Intravenous Bolus from Bag 09/17/17 2236)    And  diltiazem (CARDIZEM) 100 mg in dextrose 5% 100mL (1 mg/mL) infusion (10 mg/hr Intravenous Rate/Dose Change 09/17/17 2319)  sodium chloride 0.9 % bolus 1,000 mL (0 mLs Intravenous Stopped 09/17/17 2217)  etomidate (AMIDATE) injection 28.98 mg (28.98 mg Intravenous Given 09/17/17 2201)  fentaNYL (SUBLIMAZE) injection 75 mcg (75 mcg Intravenous  Given 09/17/17 2201)  fentaNYL (SUBLIMAZE) injection (75 mcg Intravenous Given 09/17/17 2201)  etomidate (AMIDATE) injection (10 mg Intravenous Given 09/17/17 2201)  diltiazem (CARDIZEM) 1 mg/mL load via infusion 25 mg (25 mg Intravenous Bolus from Bag 09/17/17 2318)     Initial Impression / Assessment and Plan / ED Course  I have reviewed the triage vital signs and the nursing notes.  Pertinent labs & imaging results that were available during my care of the patient were reviewed by me and considered in my medical decision making (see chart for details).     Patient presents to the ED with palpitations x 1 hour.Patient has cardioverted 3 weeks ago in the ED.Patient states she did not follow up with cardiology because the palpitations did not return until today.Patient is currently stable in bed talking to staff.  Vagal maneuvers were attempted by Dr. Clayborne DanaMesner at the bedside. Patients was remained in Afib.Etomidate & fentanyl will be used for synchronized cardioversion.  9:50PM Etomidate & Fentanyl given, synchronized cardioversion was attempted, patient back in sinus rhythm after first cardioversion. Patient returned to Afib after first cardioversion, a second attempted was done but patient continue to return to Afib.   A diltiazem drip was started for rate control, patient remains in Afib. HR currently in the 40's~ patient in bed joking with staff.Denies any lightheadedness at this time.   CHA2DS2-VASc score for Afib 3 points   12:14 AM Spoke to Dr. Adela Glimpseoutova, she advised to lower diltiazem drip to 7.5 to correct patient's bradycardia. Patient currently asymptomatic. Dr. Adela Glimpseoutova will come see patients in the ED.      Final Clinical Impressions(s) / ED Diagnoses   Final diagnoses:  Palpitations    ED Discharge Orders    None       Claude MangesSoto, Isiaah Cuervo, Cordelia Poche-C 09/18/17 0020    Mesner, Barbara CowerJason, MD 09/18/17 [phone]

## 2017-09-17 NOTE — ED Provider Notes (Signed)
H/o paroxysmal afib. Happened again today. No cp/sob/syncope.    Physical Exam  BP (!) 156/95   Pulse (!) 135   Temp 97.7 F (36.5 C)   Resp 14   Ht 5\' 5"  (1.651 m)   Wt 97.5 kg   SpO2 100%   BMI 35.78 kg/m   Physical Exam  Constitutional: She appears well-developed and well-nourished.  Eyes: Pupils are equal, round, and reactive to light.  Cardiovascular: An irregularly irregular rhythm present. Tachycardia present.  Pulmonary/Chest: She has no decreased breath sounds. She has no rales.  Abdominal: Soft.  Musculoskeletal: Normal range of motion.       Right lower leg: Normal.       Left lower leg: Normal.  Skin: Skin is warm and dry.  Nursing note and vitals reviewed.   ED Course/Procedures     .Critical Care Performed by: Marily Memos, MD Authorized by: Marily Memos, MD   Critical care provider statement:    Critical care time (minutes):  45   Critical care time was exclusive of:  Separately billable procedures and treating other patients and teaching time   Critical care was necessary to treat or prevent imminent or life-threatening deterioration of the following conditions:  Circulatory failure   Critical care was time spent personally by me on the following activities:  Discussions with consultants, evaluation of patient's response to treatment, examination of patient, ordering and performing treatments and interventions, ordering and review of laboratory studies, ordering and review of radiographic studies, pulse oximetry, re-evaluation of patient's condition, obtaining history from patient or surrogate and review of old charts   I assumed direction of critical care for this patient from another provider in my specialty: no   .Cardioversion Date/Time: 09/17/2017 10:40 PM Performed by: Marily Memos, MD Authorized by: Marily Memos, MD   Pre-procedure details:    Cardioversion basis:  Elective   Rhythm:  Atrial fibrillation   Electrode placement:   Anterior-posterior Patient sedated: Yes. Refer to sedation procedure documentation for details of sedation.  Attempt one:    Cardioversion mode:  Synchronous   Shock (Joules):  120   Shock outcome:  Conversion to normal sinus rhythm Attempt two:    Cardioversion mode:  Synchronous   Shock (Joules):  150   Shock outcome:  Conversion to normal sinus rhythm Post-procedure details:    Patient status:  Awake   Patient tolerance of procedure:  Tolerated well, no immediate complications Comments:     Attempted cardioversion, first time patient had normal sinus rhythm and subsequently went back into A. fib with RVR.  Attempted a second time and the patient stayed in sinus rhythm for approximately 2 minutes and reverted to atrial fibrillation with RVR again.  Patient is waking up from sedation shortly after that sound no third attempt was made secondary to the futility of it.   .Sedation Date/Time: 09/17/2017 10:41 PM Performed by: Marily Memos, MD Authorized by: Marily Memos, MD   Consent:    Consent obtained:  Verbal   Consent given by:  Patient   Risks discussed:  Allergic reaction, dysrhythmia, inadequate sedation, nausea, prolonged hypoxia resulting in organ damage, prolonged sedation necessitating reversal, respiratory compromise necessitating ventilatory assistance and intubation and vomiting   Alternatives discussed:  Analgesia without sedation, anxiolysis and regional anesthesia Universal protocol:    Procedure explained and questions answered to patient or proxy's satisfaction: yes     Relevant documents present and verified: yes     Test results available and properly  labeled: yes     Imaging studies available: yes     Required blood products, implants, devices, and special equipment available: yes     Site/side marked: yes     Immediately prior to procedure a time out was called: yes     Patient identity confirmation method:  Verbally with patient Indications:    Procedure  necessitating sedation performed by:  Physician performing sedation   Intended level of sedation:  Deep Pre-sedation assessment:    Time since last food or drink:  2 hours   ASA classification: class 2 - patient with mild systemic disease     Neck mobility: normal     Mouth opening:  3 or more finger widths   Thyromental distance:  4 finger widths   Mallampati score:  I - soft palate, uvula, fauces, pillars visible   Pre-sedation assessments completed and reviewed: airway patency, cardiovascular function, hydration status, mental status, nausea/vomiting, pain level, respiratory function and temperature   Immediate pre-procedure details:    Reassessment: Patient reassessed immediately prior to procedure     Reviewed: vital signs, relevant labs/tests and NPO status     Verified: bag valve mask available, emergency equipment available, intubation equipment available, IV patency confirmed, oxygen available and suction available   Procedure details (see MAR for exact dosages):    Preoxygenation:  Nasal cannula   Sedation:  Etomidate   Analgesia:  Fentanyl   Intra-procedure monitoring:  Blood pressure monitoring, cardiac monitor, continuous pulse oximetry, frequent LOC assessments, frequent vital sign checks and continuous capnometry   Intra-procedure events: none     Total Provider sedation time (minutes):  25 Post-procedure details:    Post-sedation assessment completed:  09/17/2017 10:41 PM   Attendance: Constant attendance by certified staff until patient recovered     Recovery: Patient returned to pre-procedure baseline     Post-sedation assessments completed and reviewed: airway patency, cardiovascular function, hydration status, mental status, nausea/vomiting, pain level, respiratory function and temperature     Patient is stable for discharge or admission: yes     Patient tolerance:  Tolerated well, no immediate complications    EKG Interpretation  Date/Time:  Saturday September 17 2017  20:10:49 EDT Ventricular Rate:  125 PR Interval:    QRS Duration: 83 QT Interval:  294 QTC Calculation: 424 R Axis:   49 Text Interpretation:  afib rvr Atrial premature complexes Repol abnrm suggests ischemia, diffuse leads Confirmed by Marily Memos [phone]) on 09/17/2017 11:29:26 PM        EKG Interpretation  Date/Time:  Saturday September 17 2017 22:03:30 EDT Ventricular Rate:  93 PR Interval:    QRS Duration: 108 QT Interval:  351 QTC Calculation: 437 R Axis:   68 Text Interpretation:  Sinus or ectopic atrial rhythm Inferior infarct, old Repol abnrm, severe global ischemia (LM/MVD) improved from previous afib rvr Confirmed by Marily Memos [phone]) on 09/17/2017 11:30:57 PM       EKG Interpretation  Date/Time:  Saturday September 17 2017 22:06:42 EDT Ventricular Rate:  85 PR Interval:    QRS Duration: 110 QT Interval:  364 QTC Calculation: 433 R Axis:   65 Text Interpretation:  Ectopic atrial rhythm Atrial premature complex Inferolateral infarct, age indeterminate Confirmed by Marily Memos [phone]) on 09/17/2017 11:31:25 PM            MDM  Attempted cardioversion multiple times and would convert to sinus rhythm but then subsequently go back in atrial fibrillation with RVR.  Will start on diltiazem drip  and admit to medicine for further management.  Patient already on Xarelto.  on 10 mg/hr is rate and rhythm controlled at 55-60.   CHA2DS2/VAS Stroke Risk Points  Current as of 8 minutes ago     3 >= 2 Points: High Risk  1 - 1.99 Points: Medium Risk  0 Points: Low Risk    This is the only CHA2DS2/VAS Stroke Risk Points available for the past  year.:  Last Change: N/A     Details    This score determines the patient's risk of having a stroke if the  patient has atrial fibrillation.       Points Metrics  0 Has Congestive Heart Failure:  No    Current as of 8 minutes ago  0 Has Vascular Disease:  No    Current as of 8 minutes ago  1 Has Hypertension:  Yes     Current as of 8 minutes ago  1 Age:  [age]    Current as of 8 minutes ago  0 Has Diabetes:  No    Current as of 8 minutes ago  0 Had Stroke:  No  Had TIA:  No  Had thromboembolism:  No    Current as of 8 minutes ago  1 Female:  Yes    Current as of 8 minutes ago                 Marily MemosMesner, Olie Dibert, MD 09/18/17 2348

## 2017-09-18 ENCOUNTER — Observation Stay (HOSPITAL_BASED_OUTPATIENT_CLINIC_OR_DEPARTMENT_OTHER): Payer: Medicare Other

## 2017-09-18 ENCOUNTER — Encounter (HOSPITAL_COMMUNITY): Payer: Self-pay | Admitting: *Deleted

## 2017-09-18 DIAGNOSIS — K219 Gastro-esophageal reflux disease without esophagitis: Secondary | ICD-10-CM | POA: Diagnosis not present

## 2017-09-18 DIAGNOSIS — I491 Atrial premature depolarization: Secondary | ICD-10-CM | POA: Diagnosis not present

## 2017-09-18 DIAGNOSIS — Z7901 Long term (current) use of anticoagulants: Secondary | ICD-10-CM | POA: Diagnosis not present

## 2017-09-18 DIAGNOSIS — I483 Typical atrial flutter: Secondary | ICD-10-CM

## 2017-09-18 DIAGNOSIS — I509 Heart failure, unspecified: Secondary | ICD-10-CM | POA: Diagnosis not present

## 2017-09-18 DIAGNOSIS — I340 Nonrheumatic mitral (valve) insufficiency: Secondary | ICD-10-CM | POA: Diagnosis not present

## 2017-09-18 DIAGNOSIS — I4892 Unspecified atrial flutter: Secondary | ICD-10-CM | POA: Diagnosis not present

## 2017-09-18 DIAGNOSIS — I272 Pulmonary hypertension, unspecified: Secondary | ICD-10-CM | POA: Diagnosis not present

## 2017-09-18 DIAGNOSIS — I081 Rheumatic disorders of both mitral and tricuspid valves: Secondary | ICD-10-CM | POA: Diagnosis not present

## 2017-09-18 DIAGNOSIS — I10 Essential (primary) hypertension: Secondary | ICD-10-CM

## 2017-09-18 DIAGNOSIS — R7989 Other specified abnormal findings of blood chemistry: Secondary | ICD-10-CM | POA: Diagnosis not present

## 2017-09-18 DIAGNOSIS — I110 Hypertensive heart disease with heart failure: Secondary | ICD-10-CM | POA: Diagnosis not present

## 2017-09-18 DIAGNOSIS — I4891 Unspecified atrial fibrillation: Secondary | ICD-10-CM

## 2017-09-18 DIAGNOSIS — R001 Bradycardia, unspecified: Secondary | ICD-10-CM | POA: Diagnosis not present

## 2017-09-18 DIAGNOSIS — E079 Disorder of thyroid, unspecified: Secondary | ICD-10-CM | POA: Diagnosis not present

## 2017-09-18 DIAGNOSIS — G4733 Obstructive sleep apnea (adult) (pediatric): Secondary | ICD-10-CM | POA: Diagnosis not present

## 2017-09-18 DIAGNOSIS — I480 Paroxysmal atrial fibrillation: Secondary | ICD-10-CM | POA: Diagnosis not present

## 2017-09-18 LAB — COMPREHENSIVE METABOLIC PANEL
ALBUMIN: 3.5 g/dL (ref 3.5–5.0)
ALK PHOS: 59 U/L (ref 38–126)
ALT: 57 U/L — ABNORMAL HIGH (ref 0–44)
ALT: 67 U/L — AB (ref 0–44)
ANION GAP: 6 (ref 5–15)
AST: 46 U/L — AB (ref 15–41)
AST: 67 U/L — ABNORMAL HIGH (ref 15–41)
Albumin: 3.7 g/dL (ref 3.5–5.0)
Alkaline Phosphatase: 65 U/L (ref 38–126)
Anion gap: 8 (ref 5–15)
BUN: 15 mg/dL (ref 8–23)
BUN: 16 mg/dL (ref 8–23)
CHLORIDE: 110 mmol/L (ref 98–111)
CO2: 28 mmol/L (ref 22–32)
CO2: 26 mmol/L (ref 22–32)
CREATININE: 0.95 mg/dL (ref 0.44–1.00)
Calcium: 9 mg/dL (ref 8.9–10.3)
Calcium: 9 mg/dL (ref 8.9–10.3)
Chloride: 109 mmol/L (ref 98–111)
Creatinine, Ser: 0.93 mg/dL (ref 0.44–1.00)
GFR calc Af Amer: >60 mL/min (ref >60)
GFR calc non Af Amer: >60 mL/min (ref >60)
GFR calc non Af Amer: >60 mL/min (ref >60)
Glucose, Bld: 117 mg/dL — ABNORMAL HIGH (ref 70–99)
Glucose, Bld: 138 mg/dL — ABNORMAL HIGH (ref 70–99)
POTASSIUM: 4.2 mmol/L (ref 3.5–5.1)
POTASSIUM: 4.1 mmol/L (ref 3.5–5.1)
SODIUM: 143 mmol/L (ref 135–145)
Sodium: 144 mmol/L (ref 135–145)
Total Bilirubin: 0.4 mg/dL (ref 0.3–1.2)
Total Bilirubin: 0.4 mg/dL (ref 0.3–1.2)
Total Protein: 6.9 g/dL (ref 6.5–8.1)
Total Protein: 7.6 g/dL (ref 6.5–8.1)

## 2017-09-18 LAB — PHOSPHORUS
PHOSPHORUS: 2.8 mg/dL (ref 2.5–4.6)
PHOSPHORUS: 2.9 mg/dL (ref 2.5–4.6)

## 2017-09-18 LAB — TROPONIN I
TROPONIN I: 0.05 ng/mL — AB (ref <0.03)
Troponin I: 0.04 ng/mL (ref <0.03)
Troponin I: 0.04 ng/mL (ref <0.03)

## 2017-09-18 LAB — CBC
HEMATOCRIT: 38.2 % (ref 36.0–46.0)
HEMOGLOBIN: 11.9 g/dL — AB (ref 12.0–15.0)
MCH: 26.7 pg (ref 26.0–34.0)
MCHC: 31.2 g/dL (ref 30.0–36.0)
MCV: 85.7 fL (ref 78.0–100.0)
Platelets: 257 10*3/uL (ref 150–400)
RBC: 4.46 MIL/uL (ref 3.87–5.11)
RDW: 16.6 % — ABNORMAL HIGH (ref 11.5–15.5)
WBC: 10 10*3/uL (ref 4.0–10.5)

## 2017-09-18 LAB — HIV ANTIBODY (ROUTINE TESTING W REFLEX): HIV Screen 4th Generation wRfx: NONREACTIVE

## 2017-09-18 LAB — MAGNESIUM
MAGNESIUM: 1.9 mg/dL (ref 1.7–2.4)
Magnesium: 1.9 mg/dL (ref 1.7–2.4)

## 2017-09-18 LAB — TSH: TSH: 0.17 u[IU]/mL — ABNORMAL LOW (ref 0.350–4.500)

## 2017-09-18 LAB — MRSA PCR SCREENING: MRSA by PCR: NEGATIVE

## 2017-09-18 LAB — ECHOCARDIOGRAM COMPLETE
Height: 65 in
Weight: 3440 oz

## 2017-09-18 MED ORDER — FLECAINIDE ACETATE 50 MG PO TABS
50.0000 mg | ORAL_TABLET | Freq: Two times a day (BID) | ORAL | Status: DC
  Administered 2017-09-18: 50 mg via ORAL
  Filled 2017-09-18: qty 1

## 2017-09-18 MED ORDER — CARVEDILOL 12.5 MG PO TABS
12.5000 mg | ORAL_TABLET | Freq: Two times a day (BID) | ORAL | Status: DC
  Administered 2017-09-18: 12.5 mg via ORAL
  Filled 2017-09-18: qty 1

## 2017-09-18 MED ORDER — FLECAINIDE ACETATE 50 MG PO TABS: 50.0000 mg | ORAL_TABLET | Freq: Two times a day (BID) | ORAL | 0 refills | Status: DC

## 2017-09-18 MED ORDER — ONDANSETRON HCL 4 MG PO TABS: 4.0000 mg | ORAL_TABLET | Freq: Four times a day (QID) | ORAL | Status: DC | PRN

## 2017-09-18 MED ORDER — ONDANSETRON HCL 4 MG/2ML IJ SOLN: 4.0000 mg | Freq: Four times a day (QID) | INTRAMUSCULAR | Status: DC | PRN

## 2017-09-18 MED ORDER — HYDROCODONE-ACETAMINOPHEN 5-325 MG PO TABS: 1.0000 | ORAL_TABLET | ORAL | Status: DC | PRN

## 2017-09-18 MED ORDER — PANTOPRAZOLE SODIUM 40 MG PO TBEC
40.0000 mg | DELAYED_RELEASE_TABLET | Freq: Every day | ORAL | Status: DC
Start: 2017-09-18 — End: 2017-09-18
  Administered 2017-09-18: 40 mg via ORAL
  Filled 2017-09-18: qty 1

## 2017-09-18 MED ORDER — ACETAMINOPHEN 325 MG PO TABS: 650.0000 mg | ORAL_TABLET | Freq: Four times a day (QID) | ORAL | Status: DC | PRN

## 2017-09-18 MED ORDER — RIVAROXABAN 20 MG PO TABS
20.0000 mg | ORAL_TABLET | Freq: Every day | ORAL | Status: DC
  Administered 2017-09-18: 20 mg via ORAL
  Filled 2017-09-18: qty 1

## 2017-09-18 MED ORDER — SODIUM CHLORIDE 0.9 % IV SOLN
INTRAVENOUS | Status: AC
  Administered 2017-09-18: 02:00:00 via INTRAVENOUS

## 2017-09-18 MED ORDER — ACETAMINOPHEN 650 MG RE SUPP: 650.0000 mg | Freq: Four times a day (QID) | RECTAL | Status: DC | PRN

## 2017-09-18 MED ORDER — CARVEDILOL 12.5 MG PO TABS: 12.5000 mg | ORAL_TABLET | Freq: Two times a day (BID) | ORAL | Status: DC

## 2017-09-18 NOTE — Progress Notes (Signed)
Pt has declined use of hospital provided CPAP at this time.  RT to monitor and assess as needed.

## 2017-09-18 NOTE — Progress Notes (Signed)
  Echocardiogram 2D Echocardiogram has been performed.  Mary Poole, Mary Poole 09/18/2017, 10:30 AM

## 2017-09-18 NOTE — Discharge Summary (Signed)
Physician Discharge Summary  [name]  ZOX:[id number]  DOB: 18-Dec-1950  DOA: 09/17/2017 PCP: Everrett Coombe, DO  Admit date: 09/17/2017 Discharge date: 09/18/2017  Admitted From: Home Disposition: Home  Recommendations for Outpatient Follow-up:  1. Follow up with PCP in 1-2 weeks 2. Follow-up with cardiology in 1 to 2 weeks 3. Please obtain BMP/CBC in one week to monitor electrolytes and hemoglobin  Discharge Condition: Stable CODE STATUS: Full code Diet recommendation: Heart Healthy    Brief/Interim Summary: For full details see H&P/Progress note, but in brief, Mary Poole is a 67 year old female with medical history of PAF and a flutter on Xarelto and carvedilol, hypertension and OSA.  Patient presented to the emergency department complaining of palpitations and shortness of breath while she was watching TV. Upon ED evaluation she was found to be in A. fib with RVR, MI daily cardioverted to NSR but then A. fib recurred and then second cardioversion failed.  She was given a bolus of Cardizem 10 mg and then 25 and subsequently started on Cardizem drip.  Patient converted to sinus bradycardia at 40 to 50s bpm's.  She was admitted with working diagnosis of A. fib with RVR.  Subjective: Patient seen in exam, she is asymptomatic denies chest pain, shortness of breath and palpitations.   Discharge Diagnoses/Hospital Course:  Active Problems:   Essential hypertension   GERD (gastroesophageal reflux disease)   Paroxysmal atrial fibrillation (HCC)   Atrial flutter (HCC)   Thyroid disease   Atrial fibrillation with RVR (HCC)   Sinus bradycardia   OSA (obstructive sleep apnea)  Paroxysmal A. fib with RVR Converted to normal sinus rhythm after cardioverted in the ER went back to A. fib, 2nd DCCV attempted but failed.  Patient was started on Cardizem ggt and converted to sinus bradycardia.  Cardizem was discontinued.  She remains in NSR, cardiology was consulted on restarted Coreg 12.5  mg twice daily.  Echocardiogram was obtained which showed normal LVEF with mild pulmonary hypertension. Case discussed with Dr Mayford Knife (Cardiology) recommending to start flecainide 50 mg twice daily and follow-up as an outpatient. Continue Xarelto.  Patient may be candidate for ablation as outpatient. Advised to discontinue Hawthorn as this can affect anticoagulation effects and cause electrolyte abnormalities. Keep self well hydrated.    Hypertension BP slightly elevated in the a.m. She was restarted in Coreg and BP improved.  OSA Continue CPAP  Elevated troponin Likely demand ischemia in setting of A. fib Echo normal EF, normal wall motion.  Follow-up as an outpatient, not further ischemic work-up at this time.  On the day of the discharge the patient's vitals were stable, and no other acute medical condition were reported by patient. the patient was felt safe to be discharge to home.  Discharge Instructions  You were cared for by a hospitalist during your hospital stay. If you have any questions about your discharge medications or the care you received while you were in the hospital after you are discharged, you can call the unit and asked to speak with the hospitalist on call if the hospitalist that took care of you is not available. Once you are discharged, your primary care physician Mary handle any further medical issues. Please note that NO REFILLS for any discharge medications Mary be authorized once you are discharged, as it is imperative that you return to your primary care physician (or establish a relationship with a primary care physician if you do not have one) for your aftercare needs so that they  can reassess your need for medications and monitor your lab values.  Discharge Instructions    Call MD for:  difficulty breathing, headache or visual disturbances   Complete by:  As directed    Call MD for:  extreme fatigue   Complete by:  As directed    Call MD for:  hives   Complete  by:  As directed    Call MD for:  persistant dizziness or light-headedness   Complete by:  As directed    Call MD for:  persistant nausea and vomiting   Complete by:  As directed    Call MD for:  redness, tenderness, or signs of infection (pain, swelling, redness, odor or green/yellow discharge around incision site)   Complete by:  As directed    Call MD for:  severe uncontrolled pain   Complete by:  As directed    Call MD for:  temperature >100.4   Complete by:  As directed    Diet - low sodium heart healthy   Complete by:  As directed    Discharge instructions   Complete by:  As directed    Stop taking Hawthorn  Follow up with PCP   Increase activity slowly   Complete by:  As directed      Allergies as of 09/18/2017      Reactions   Diphenhydramine Hcl Other (See Comments)      Medication List    STOP taking these medications   fluticasone 50 MCG/ACT nasal spray Commonly known as:  FLONASE   HAWTHORNE BERRY PO   Iron 325 (65 Fe) MG Tabs   Magnesium Oxide-Asafoetida 250-6 MG-MIN/5ML Liqd   triamcinolone cream 0.1 % Commonly known as:  KENALOG     TAKE these medications   carvedilol 12.5 MG tablet Commonly known as:  COREG Take 1 tablet (12.5 mg total) by mouth 2 (two) times daily.   flecainide 50 MG tablet Commonly known as:  TAMBOCOR Take 1 tablet (50 mg total) by mouth every 12 (twelve) hours.   omeprazole 20 MG capsule Commonly known as:  PRILOSEC Take 1 capsule (20 mg total) by mouth daily.   XARELTO 20 MG Tabs tablet Generic drug:  rivaroxaban TAKE 1 TABLET BY MOUTH EVERY DAY What changed:  Another medication with the same name was removed. Continue taking this medication, and follow the directions you see here.      Follow-up Information    Mary Poole, Mary Martin, MD Follow up.   Specialty:  Cardiology Why:  Office Mary call you to schedule follow-up Contact information: [address] [place] [id number] [phone]           Allergies  Allergen Reactions  . Diphenhydramine Hcl Other (See Comments)    Consultations:  Cards   Procedures/Studies: Dg Chest 2 View  Result Date: 09/17/2017 CLINICAL DATA:  Dyspnea with chest palpitations. History of atrial fibrillation and atrial flutter. EXAM: CHEST - 2 VIEW COMPARISON:  None. FINDINGS: Top-normal heart size. Nonaneurysmal thoracic aorta. Mild pulmonary vascular congestion without acute pulmonary consolidation, effusion or pneumothorax. No acute osseous abnormality. IMPRESSION: Mild central vascular congestion without acute pulmonary consolidation. Electronically Signed   By: Tollie Ethavid  Kwon M.D.   On: 09/17/2017 20:38   Dg Chest 2 View  Result Date: 09/02/2017 CLINICAL DATA:  Chest pain.  History of atrial flutter.  Migraines. EXAM: CHEST - 2 VIEW COMPARISON:  02/22/2017 FINDINGS: Normal heart size and pulmonary vascularity. No focal airspace disease or consolidation in the  lungs. No blunting of costophrenic angles. No pneumothorax. Mediastinal contours appear intact. Degenerative changes in the spine. IMPRESSION: No active cardiopulmonary disease. Electronically Signed   By: Burman Nieves M.D.   On: 09/02/2017 21:00   ECHO 09/18/2017 ------------------------------------------------------------------- Study Conclusions  - Left ventricle: The cavity size was normal. Wall thickness was   increased in a pattern of mild LVH. Systolic function was normal.   The estimated ejection fraction was in the range of 55% to 60%.   Wall motion was normal; there were no regional wall motion   abnormalities. Doppler parameters are consistent with restrictive   physiology, indicative of decreased left ventricular diastolic   compliance and/or increased left atrial pressure. - Mitral valve: Calcified annulus. There was moderate   regurgitation. - Left atrium: The atrium was severely dilated. - Atrial septum: The septum bowed from left to right, consistent   with increased  left atrial pressure. - Pulmonary arteries: Systolic pressure was moderately increased.   PA peak pressure: 52 mm Hg (S).  Impressions:  - Normal LV function; mild LVH; restrictive filling; moderate MR;   severe LAE; mild TR with moderate pulmonary hypertension.   Discharge Exam: Vitals:   09/18/17 1200 09/18/17 1300  BP: (!) 148/67   Pulse:    Resp: 17 16  Temp: 98.1 F (36.7 C)   SpO2: 100% 100%   Vitals:   09/18/17 1000 09/18/17 1100 09/18/17 1200 09/18/17 1300  BP: (!) 158/85 (!) 119/55 (!) 148/67   Pulse:      Resp: 14 17 17 16   Temp:   98.1 F (36.7 C)   TempSrc:   Oral   SpO2: 100% 100% 100% 100%  Weight:      Height:        General: Pt is alert, awake, not in acute distress Cardiovascular: RRR, S1/S2 +, no rubs, no gallops Respiratory: CTA bilaterally, no wheezing, no rhonchi Abdominal: Soft, NT, ND, bowel sounds + Extremities: no edema   The results of significant diagnostics from this hospitalization (including imaging, microbiology, ancillary and laboratory) are listed below for reference.     Microbiology: Recent Results (from the past 240 hour(s))  MRSA PCR Screening     Status: None   Collection Time: 09/18/17  1:34 AM  Result Value Ref Range Status   MRSA by PCR NEGATIVE NEGATIVE Final    Comment:        The GeneXpert MRSA Assay (FDA approved for NASAL specimens only), is one component of a comprehensive MRSA colonization surveillance program. It is not intended to diagnose MRSA infection nor to guide or monitor treatment for MRSA infections. Performed at [facility], [address], [place], [place] [address]      Labs: BNP (last 3 results) No results for input(s): BNP in the last 8760 hours. Basic Metabolic Panel: Recent Labs  Lab 09/17/17 2123 09/17/17 2340 09/18/17 0352  NA 143 144 143  K 4.0 4.1 4.2  CL 105 110 109  CO2  --  26 28  GLUCOSE 116* 138* 117*  BUN 17 15 16   CREATININE 1.00 0.95 0.93   CALCIUM  --  9.0 9.0  MG  --  1.9 1.9  PHOS  --  2.8 2.9   Liver Function Tests: Recent Labs  Lab 09/17/17 2340 09/18/17 0352  AST 67* 46*  ALT 67* 57*  ALKPHOS 65 59  BILITOT 0.4 0.4  PROT 7.6 6.9  ALBUMIN 3.7 3.5   No results for input(s): LIPASE, AMYLASE  in the last 168 hours. No results for input(s): AMMONIA in the last 168 hours. CBC: Recent Labs  Lab 09/17/17 2123 09/17/17 2340 09/18/17 0352  WBC  --  9.4 10.0  NEUTROABS  --  6.2  --   HGB 14.3 13.0 11.9*  HCT 42.0 40.7 38.2  MCV  --  85.3 85.7  PLT  --  274 257   Cardiac Enzymes: Recent Labs  Lab 09/18/17 0021 09/18/17 0629 09/18/17 1210  TROPONINI 0.04* 0.05* 0.04*   BNP: Invalid input(s): POCBNP CBG: No results for input(s): GLUCAP in the last 168 hours. D-Dimer No results for input(s): DDIMER in the last 72 hours. Hgb A1c No results for input(s): HGBA1C in the last 72 hours. Lipid Profile No results for input(s): CHOL, HDL, LDLCALC, TRIG, CHOLHDL, LDLDIRECT in the last 72 hours. Thyroid function studies Recent Labs    09/18/17 0352  TSH 0.170*   Anemia work up No results for input(s): VITAMINB12, FOLATE, FERRITIN, TIBC, IRON, RETICCTPCT in the last 72 hours. Urinalysis No results found for: COLORURINE, APPEARANCEUR, LABSPEC, PHURINE, GLUCOSEU, HGBUR, BILIRUBINUR, KETONESUR, PROTEINUR, UROBILINOGEN, NITRITE, LEUKOCYTESUR Sepsis Labs Invalid input(s): PROCALCITONIN,  WBC,  LACTICIDVEN Microbiology Recent Results (from the past 240 hour(s))  MRSA PCR Screening     Status: None   Collection Time: 09/18/17  1:34 AM  Result Value Ref Range Status   MRSA by PCR NEGATIVE NEGATIVE Final    Comment:        The GeneXpert MRSA Assay (FDA approved for NASAL specimens only), is one component of a comprehensive MRSA colonization surveillance program. It is not intended to diagnose MRSA infection nor to guide or monitor treatment for MRSA infections. Performed at [facility],  [address] [place], [place] [address]     Time coordinating discharge: 32 minutes  SIGNED:  Latrelle Dodrill, MD  [facility] 09/18/2017, 2:16 PM  Pager please text page via  [email or web address]  Note - This record has been created using [employer]. Chart creation errors have been sought, but may not always have been located. Such creation errors do not reflect on the standard of medical care.

## 2017-09-18 NOTE — Progress Notes (Signed)
CRITICAL VALUE ALERT  Critical Value:  Trop 0.04  Date & Time Notied:  09/18/2017 2:08 AM   Provider Notified:  Np Schorr  Orders Received/Actions taken: none at this time.

## 2017-09-18 NOTE — Progress Notes (Signed)
ED TO INPATIENT HANDOFF REPORT  Name/Age/Gender Mary Poole 67 y.o. female  Code Status Code Status History    Date Active Date Inactive Code Status Order ID Comments User Context   02/22/2017 2124 02/24/2017 2221 Full Code [id number]  Elwin Mocha, MD ED      Home/SNF/Other Home  Chief Complaint palpitations  Level of Care/Admitting Diagnosis ED Disposition    ED Disposition Condition [facility] Area: [facility] [[id number]]  Level of Care: Stepdown [14]  Admit to SDU based on following criteria: Hemodynamic compromise or significant risk of instability:  Patient requiring short term acute titration and management of vasoactive drips, and invasive monitoring (i.e., CVP and Arterial line).  Diagnosis: Atrial fibrillation with RVR [facility]) [009381]  Admitting Physician: Toy Baker [[id number]]  Attending Physician: Toy Baker [[id number]]  PT Class (Do Not Modify): Observation [104]  PT Acc Code (Do Not Modify): Observation [10022]       Medical History Past Medical History:  Diagnosis Date  . GERD (gastroesophageal reflux disease)   . H/O atrial flutter   . Hypertension   . Migraine   . Sleep apnea     Allergies Allergies  Allergen Reactions  . Diphenhydramine Hcl Other (See Comments)    IV Location/Drains/Wounds Patient Lines/Drains/Airways Status   Active Line/Drains/Airways    Name:   Placement date:   Placement time:   Site:   Days:   Peripheral IV 09/02/17 Left;Lateral Antecubital   09/02/17    2156    Antecubital   16   Peripheral IV 09/17/17 Left Antecubital   09/17/17    2114    Antecubital   1   Peripheral IV 09/17/17 Left Forearm   09/17/17    2341    Forearm   1          Labs/Imaging Results for orders placed or performed during the hospital encounter of 09/17/17 (from the past 48 hour(s))  I-Stat Chem 8, ED     Status: Abnormal   Collection Time: 09/17/17  9:23 PM  Result Value Ref Range   Sodium 143  135 - 145 mmol/L   Potassium 4.0 3.5 - 5.1 mmol/L   Chloride 105 98 - 111 mmol/L   BUN 17 8 - 23 mg/dL   Creatinine, Ser 1.00 0.44 - 1.00 mg/dL   Glucose, Bld 116 (H) 70 - 99 mg/dL   Calcium, Ion 1.23 1.15 - 1.40 mmol/L   TCO2 29 22 - 32 mmol/L   Hemoglobin 14.3 12.0 - 15.0 g/dL   HCT 42.0 36.0 - 46.0 %  CBC with Differential     Status: Abnormal   Collection Time: 09/17/17 11:40 PM  Result Value Ref Range   WBC 9.4 4.0 - 10.5 K/uL   RBC 4.77 3.87 - 5.11 MIL/uL   Hemoglobin 13.0 12.0 - 15.0 g/dL   HCT 40.7 36.0 - 46.0 %   MCV 85.3 78.0 - 100.0 fL   MCH 27.3 26.0 - 34.0 pg   MCHC 31.9 30.0 - 36.0 g/dL   RDW 16.4 (H) 11.5 - 15.5 %   Platelets 274 150 - 400 K/uL   Neutrophils Relative % 67 %   Neutro Abs 6.2 1.7 - 7.7 K/uL   Lymphocytes Relative 28 %   Lymphs Abs 2.7 0.7 - 4.0 K/uL   Monocytes Relative 4 %   Monocytes Absolute 0.4 0.1 - 1.0 K/uL   Eosinophils Relative 1 %   Eosinophils Absolute 0.1 0.0 -  0.7 K/uL   Basophils Relative 0 %   Basophils Absolute 0.0 0.0 - 0.1 K/uL    Comment: Performed at [facility], [place] [address] [place], [place] [address]  Comprehensive metabolic panel     Status: Abnormal   Collection Time: 09/17/17 11:40 PM  Result Value Ref Range   Sodium 144 135 - 145 mmol/L   Potassium 4.1 3.5 - 5.1 mmol/L   Chloride 110 98 - 111 mmol/L   CO2 26 22 - 32 mmol/L   Glucose, Bld 138 (H) 70 - 99 mg/dL   BUN 15 8 - 23 mg/dL   Creatinine, Ser 0.95 0.44 - 1.00 mg/dL   Calcium 9.0 8.9 - 10.3 mg/dL   Total Protein 7.6 6.5 - 8.1 g/dL   Albumin 3.7 3.5 - 5.0 g/dL   AST 67 (H) 15 - 41 U/L   ALT 67 (H) 0 - 44 U/L   Alkaline Phosphatase 65 38 - 126 U/L   Total Bilirubin 0.4 0.3 - 1.2 mg/dL   GFR calc non Af Amer >60 >60 mL/min   GFR calc Af Amer >60 >60 mL/min    Comment: (NOTE) The eGFR has been calculated using the CKD EPI equation. This calculation has not been validated in all clinical situations. eGFR's persistently <60 mL/min  signify possible Chronic Kidney Disease.    Anion gap 8 5 - 15    Comment: Performed at [facility], [place] [address] [place], [place] [address]   Dg Chest 2 View  Result Date: 09/17/2017 CLINICAL DATA:  Dyspnea with chest palpitations. History of atrial fibrillation and atrial flutter. EXAM: CHEST - 2 VIEW COMPARISON:  None. FINDINGS: Top-normal heart size. Nonaneurysmal thoracic aorta. Mild pulmonary vascular congestion without acute pulmonary consolidation, effusion or pneumothorax. No acute osseous abnormality. IMPRESSION: Mild central vascular congestion without acute pulmonary consolidation. Electronically Signed   By: [place] Royalty M.D.   On: 09/17/2017 20:38    Pending Labs Unresulted Labs (From admission, onward)    Start     Ordered   09/18/17 0031  Magnesium  Add-on,   R     09/18/17 0030   09/18/17 0031  Phosphorus  Add-on,   R     09/18/17 0030   09/18/17 0021  Troponin I (q 6hr x 3)  Now then every 6 hours,   R     09/18/17 0020          Vitals/Pain Today's Vitals   09/17/17 2208 09/17/17 2213 09/17/17 2222 09/17/17 2225  BP:    (!) 156/95  Pulse: (!) 143 (!) 136  (!) 135  Resp: (!) _0 Temp:      SpO2: 99% 100%  100%  Weight:      Height:      PainSc:   0-No pain     Isolation Precautions No active isolations  Medications Medications  sodium chloride 0.9 % bolus 1,000 mL (0 mLs Intravenous Stopped 09/17/17 2217)  etomidate (AMIDATE) injection 28.98 mg (28.98 mg Intravenous Given 09/17/17 2201)  fentaNYL (SUBLIMAZE) injection 75 mcg (75 mcg Intravenous Given 09/17/17 2201)  fentaNYL (SUBLIMAZE) injection (75 mcg Intravenous Given 09/17/17 2201)  etomidate (AMIDATE) injection (10 mg Intravenous Given 09/17/17 2201)  diltiazem (CARDIZEM) 1 mg/mL load via infusion 15 mg (15 mg Intravenous Bolus from Bag 09/17/17 2236)  diltiazem (CARDIZEM) 1 mg/mL load via infusion 25 mg (25 mg Intravenous Bolus from Bag 09/17/17 2318)     Mobility walks

## 2017-09-18 NOTE — Progress Notes (Signed)
I have sent a message to our office's scheduling team requesting a follow-up appointment, and our office will call the patient with this information.  Dayna Dunn PA-C  

## 2017-09-18 NOTE — Consult Note (Addendum)
Admit date: 09/17/2017 Referring Physician  Dr. Edward Jolly Primary Physician  Dr. Everrett Coombe Primary Cardiologist  Dr. Sherilyn Cooter Smith/Dr. [name] EP Reason for Consultation  New onset atrial fibrillation  HPI: Mary Poole is a 67 y.o. female who is being seen today for the evaluation of atrial fibrillation with RVr at the request of Dr. Edward Jolly.  This is a 67 yo female with a history of PAF and atrial flutter with 1:1 conduction with possible accessory AV pathway (short PR on baseline EKG) at >220bpm (refused ablation) and managed medically with carvedilol, HTN and OSA (not had CPAP titration yet).  Her CHADS2VASC score is 3 and is on chronic anticoagulation with Xarelto.  She has a history of anemia with GI workup noting angiodysplastic vessel in stomach. 3 weeks ago she was seen in the ER with palpitations and underwent DCCV for  She presented to ER with complaints of palpitations and SOB starting 10 minutes prior to arrival while watching TV. She had eaten a handful of barbecue chips prior to onset. She says that the last time she had palpitations is was triggered by the chips.  She takes Hawthorn supp (apparently herbal supp used for cardiovascular conditions -  believed to prolong the action potential duration by inhibiting the delayed and inward rectifier potassium current thus causing negative chronotropic effect (56). Additionally it is also believed to inhibit the beta-adrenergic effects (55). It also has positive ionotropic action which is exerted by inhibiting the Na-K ATPase activity and phosphodiesterase enzyme).  She also has been rubbing her body with magnesium.  In ER she was found to be in afib with RVR and emergently cardioverted to NSR but then afib reoccurred and then second cardioversion failed to convert her.  She was given a bolus of Cardizem 10mg  and then 25mg  and started on Cardizem gtt and then converted to sinus bradycardia at 40-50bpm.      PMH:   Past Medical History:    Diagnosis Date  . GERD (gastroesophageal reflux disease)   . H/O atrial flutter   . Hypertension   . Migraine   . Sleep apnea      PSH:   Past Surgical History:  Procedure Laterality Date  . COLONOSCOPY N/A 02/24/2017   Procedure: COLONOSCOPY;  Surgeon: Jeani Hawking, MD;  Location: [facility] ENDOSCOPY;  Service: Gastroenterology;  Laterality: N/A;  . ENTEROSCOPY N/A 02/24/2017   Procedure: ENTEROSCOPY;  Surgeon: Jeani Hawking, MD;  Location: [facility] ENDOSCOPY;  Service: Gastroenterology;  Laterality: N/A;  . HERNIA REPAIR    . WISDOM TOOTH EXTRACTION      Allergies:  Diphenhydramine hcl Prior to Admit Meds:   Medications Prior to Admission  Medication Sig Dispense Refill Last Dose  . carvedilol (COREG) 12.5 MG tablet Take 1 tablet (12.5 mg total) by mouth 2 (two) times daily. 180 tablet 3 09/17/2017 at 10am  . HAWTHORNE BERRY PO Take 5 mLs by mouth daily.   09/17/2017 at Unknown time  . Magnesium Oxide-Asafoetida 250-6 MG-MIN/5ML LIQD Take 5 mLs by mouth daily.   09/17/2017 at Unknown time  . omeprazole (PRILOSEC) 20 MG capsule Take 1 capsule (20 mg total) by mouth daily. 90 capsule 2 09/16/2017 at Unknown time  . rivaroxaban (XARELTO) 20 MG TABS tablet Take 1 tablet (20 mg total) by mouth daily. Please take after 3 days 90 tablet 3 09/16/2017 at 10pm  . triamcinolone cream (KENALOG) 0.1 % Apply 1 application topically 2 (two) times daily.   1 09/16/2017 at Unknown time  .  Ferrous Sulfate (IRON) 325 (65 Fe) MG TABS Take 1 tablet (325 mg total) by mouth 2 (two) times daily. (Patient not taking: Reported on 07/20/2017) 60 each 0 Not Taking at Unknown time  . fluticasone (FLONASE) 50 MCG/ACT nasal spray Place 1 spray into both nostrils 2 (two) times daily.  0 Not Taking at Unknown time  . XARELTO 20 MG TABS tablet TAKE 1 TABLET BY MOUTH EVERY DAY (Patient not taking: Reported on 09/17/2017) 90 tablet 2 Not Taking at Unknown time   Fam HX:    Family History  Problem Relation Age of Onset  . CAD Father   .  AAA (abdominal aortic aneurysm) Sister   . Thyroid disease Sister    Social HX:    Social History   Socioeconomic History  . Marital status: Married    Spouse name: Not on file  . Number of children: Not on file  . Years of education: Not on file  . Highest education level: Not on file  Occupational History  . Not on file  Social Needs  . Financial resource strain: Not hard at all  . Food insecurity:    Worry: Never true    Inability: Never true  . Transportation needs:    Medical: No    Non-medical: No  Tobacco Use  . Smoking status: Former Games developer  . Smokeless tobacco: Never Used  Substance and Sexual Activity  . Alcohol use: No  . Drug use: No  . Sexual activity: Not on file  Lifestyle  . Physical activity:    Days per week: Not on file    Minutes per session: Not on file  . Stress: Not on file  Relationships  . Social connections:    Talks on phone: Not on file    Gets together: Not on file    Attends religious service: Not on file    Active member of club or organization: Not on file    Attends meetings of clubs or organizations: Not on file    Relationship status: Not on file  . Intimate partner violence:    Fear of current or ex partner: Not on file    Emotionally abused: Not on file    Physically abused: Not on file    Forced sexual activity: Not on file  Other Topics Concern  . Not on file  Social History Narrative  . Not on file     ROS:  All  ROS were addressed and are negative except what is stated in the HPI  Physical Exam: Blood pressure (!) 197/68, pulse (!) 49, temperature 98.4 F (36.9 C), temperature source Oral, resp. rate (!) 22, height 5\' 5"  (1.651 m), weight 97.5 kg, SpO2 100 %.    General: Well developed, well nourished, in no acute distress Head: Eyes PERRLA, No xanthomas.   Normal cephalic and atramatic  Lungs:   Clear bilaterally to auscultation and percussion. Heart:   HRRR S1 S2 Pulses are 2+ & equal.            No carotid  bruit. No JVD.  No abdominal bruits. No femoral bruits. Abdomen: Bowel sounds are positive, abdomen soft and non-tender without masses or                  Hernia's noted. Msk:  Back normal, normal gait. Normal strength and tone for age. Extremities:   No clubbing, cyanosis or edema.  DP +1 Neuro: Alert and oriented X 3. Psych:  Good affect, responds  appropriately    Labs:   Lab Results  Component Value Date   WBC 10.0 09/18/2017   HGB 11.9 (L) 09/18/2017   HCT 38.2 09/18/2017   MCV 85.7 09/18/2017   PLT 257 09/18/2017    Recent Labs  Lab 09/18/17 0352  NA 143  K 4.2  CL 109  CO2 28  BUN 16  CREATININE 0.93  CALCIUM 9.0  PROT 6.9  BILITOT 0.4  ALKPHOS 59  ALT 57*  AST 46*  GLUCOSE 117*   No results found for: PTT Lab Results  Component Value Date   INR 1.33 02/22/2017   Lab Results  Component Value Date   TROPONINI 0.05 (HH) 09/18/2017    No results found for: CHOL No results found for: HDL No results found for: LDLCALC No results found for: TRIG No results found for: CHOLHDL No results found for: LDLDIRECT    Radiology:  Dg Chest 2 View  Result Date: 09/17/2017 CLINICAL DATA:  Dyspnea with chest palpitations. History of atrial fibrillation and atrial flutter. EXAM: CHEST - 2 VIEW COMPARISON:  None. FINDINGS: Top-normal heart size. Nonaneurysmal thoracic aorta. Mild pulmonary vascular congestion without acute pulmonary consolidation, effusion or pneumothorax. No acute osseous abnormality. IMPRESSION: Mild central vascular congestion without acute pulmonary consolidation. Electronically Signed   By: Tollie Ethavid  Kwon M.D.   On: 09/17/2017 20:38     Telemetry    NSR at 79bpm  ECG    #1 afib with RVR and ST/T wave abnormality inferolaterally - personally reviewed #2 sinus brady with PACs and short PR - Personally Reviewe   ASSESSMENT/PLAN:   1.  Paroxysmal atrial fibrillation with RVR -converted to NSR in ER but then went back into it and failed second  attempt at DCCV -converted to sinus brady on IV Cardizem gtt which is now off -now in NSR at 79bpm -restart Carvedilol 12.5mg  BID  -continue Xarelto -discussed with EP and if echo shows normal LVF with no RWMAs then start Flecainide 50g BID and followup outpt with Dr. Elberta Fortisamnitz.  -instructed patient to stop the Hawthorne and magnesium rub  2.  HTN -BP elevated this am -restarting carvedilol  3.  OSA -had CPAP titration and got her PAP device 2 weeks ago  4.  Elevated troponin -minimally elevated with flat trend related to demand ischemia in the setting of afib with RVR and DCCV -check 2D echo as I do not see an echo done in the past  Armanda Magicraci Turner, MD  09/18/2017  8:13 AM  Addendum: 2D echo reviewed and LVF is normal with EF 55-60% with normal wall motion.  Start Flecainide 50mg  BID.  [facility] will sign off.   Medication Recommendations:  Carvedilol 12.5mg  BID, Flecainide 50mg  BID and Xarelto 20mg  daily.   Other recommendations (labs, testing, etc):  None Follow up as an outpatient:  Dr. Elberta Fortisamnitz with EP in 1-2 weeks  Signed: Armanda Magicraci Turner, MD [facility]  09/18/2017

## 2017-09-19 ENCOUNTER — Telehealth: Payer: Self-pay | Admitting: Behavioral Health

## 2017-09-19 NOTE — Telephone Encounter (Signed)
Unable to reach patient at time of TCM/Hospital Follow-up call. Left message for patient to return call when available.  

## 2017-09-20 NOTE — Telephone Encounter (Signed)
Attempted to reach patient x 2 for TCM/Hospital Follow-up call. Left message for a call back at the patient's earliest convenience.

## 2017-09-21 NOTE — Telephone Encounter (Signed)
Unable to reach patient at this time for TCM call; voicemail not available; will call again tomorrow 09/22/17.

## 2017-09-26 NOTE — Telephone Encounter (Signed)
Attempted to reach patient x 4 to complete TCM/Hospital Follow-up call; no answer. Will try contacting patient again at a later time.

## 2017-10-05 ENCOUNTER — Encounter: Payer: Self-pay | Admitting: Cardiology

## 2017-10-05 ENCOUNTER — Ambulatory Visit (INDEPENDENT_AMBULATORY_CARE_PROVIDER_SITE_OTHER): Payer: Medicare Other | Admitting: Cardiology

## 2017-10-05 VITALS — BP 152/94 | HR 80 | Ht 65.0 in | Wt 215.6 lb

## 2017-10-05 DIAGNOSIS — I10 Essential (primary) hypertension: Secondary | ICD-10-CM

## 2017-10-05 DIAGNOSIS — I483 Typical atrial flutter: Secondary | ICD-10-CM

## 2017-10-05 DIAGNOSIS — G4733 Obstructive sleep apnea (adult) (pediatric): Secondary | ICD-10-CM | POA: Diagnosis not present

## 2017-10-05 DIAGNOSIS — I480 Paroxysmal atrial fibrillation: Secondary | ICD-10-CM | POA: Diagnosis not present

## 2017-10-05 MED ORDER — FLECAINIDE ACETATE 100 MG PO TABS: 100.0000 mg | ORAL_TABLET | Freq: Two times a day (BID) | ORAL | 3 refills | Status: DC

## 2017-10-05 MED ORDER — CARVEDILOL 25 MG PO TABS: 25.0000 mg | ORAL_TABLET | Freq: Two times a day (BID) | ORAL | 3 refills | Status: DC

## 2017-10-05 NOTE — Progress Notes (Signed)
Electrophysiology Office Note   Date:  10/05/2017   ID:  Mary Poole, DOB 05/18/1950, MRN [id number]  PCP:  Everrett CoombeMatthews, Cody, DO  Cardiologist:  Katrinka BlazingSmith Primary Electrophysiologist:  Isabele Lollar Jorja LoaMartin Emmilyn Crooke, MD    No chief complaint on file.    History of Present Illness: Mary Poole is a 67 y.o. female who is being seen today for the evaluation of atrial flutter at the request of Everrett CoombeMatthews, Cody, DO. Presenting today for electrophysiology evaluation. She has a history of hypertension and palpitations. She was diagnosed with atrial fibrillation on 07/18/16. She had 2 episodes of chest pressure with radiation to her arm in late May or early June. It lasted 20 minutes. EMS was called but she felt better. 10 days later, she had similar symptoms.  Went to the emergency room where she had a wide-complex tachycardia.  While in the emergency room, her rate slowed and showed a likely atrial flutter.  It was thought that this was the same rhythm with the wide-complex tachycardia being apparent 1-1 conduction.  She reverted to sinus rhythm with IV medications.    She is overall feeling well today.  Unfortunately she has had multiple ER visits for palpitations that turned out to be rapid atrial flutter.  She did require cardioversion at one point.  He has been compliant with all of her medications.  She does say that she has changed her diet and has been eating healthy organic foods.  She is trying to exercise more often.  She has not found any exacerbating or alleviating factors for her palpitations.  Today, denies symptoms of palpitations, chest pain, shortness of breath, orthopnea, PND, lower extremity edema, claudication, dizziness, presyncope, syncope, bleeding, or neurologic sequela. The patient is tolerating medications without difficulties.     Past Medical History:  Diagnosis Date  . GERD (gastroesophageal reflux disease)   . H/O atrial flutter   . Hypertension   . Migraine   . Sleep apnea     Past Surgical History:  Procedure Laterality Date  . COLONOSCOPY N/A 02/24/2017   Procedure: COLONOSCOPY;  Surgeon: Jeani HawkingHung, Patrick, MD;  [facility];  Service: Gastroenterology;  Laterality: N/A;  . ENTEROSCOPY N/A 02/24/2017   Procedure: ENTEROSCOPY;  Surgeon: Jeani HawkingHung, Patrick, MD;  [facility];  Service: Gastroenterology;  Laterality: N/A;  . HERNIA REPAIR    . WISDOM TOOTH EXTRACTION       Current Outpatient Medications  Medication Sig Dispense Refill  . carvedilol (COREG) 12.5 MG tablet Take 1 tablet (12.5 mg total) by mouth 2 (two) times daily. 180 tablet 3  . flecainide (TAMBOCOR) 50 MG tablet Take 1 tablet (50 mg total) by mouth every 12 (twelve) hours. 60 tablet 0  . omeprazole (PRILOSEC) 20 MG capsule Take 1 capsule (20 mg total) by mouth daily. 90 capsule 2  . XARELTO 20 MG TABS tablet TAKE 1 TABLET BY MOUTH EVERY DAY 90 tablet 2   No current facility-administered medications for this visit.     Allergies:   Diphenhydramine hcl   Social History:  The patient  reports that she has quit smoking. She has never used smokeless tobacco. She reports that she does not drink alcohol or use drugs.   Family History:  The patient's family history includes AAA (abdominal aortic aneurysm) in her sister; CAD in her father; Thyroid disease in her sister.   ROS:  Please see the history of present illness.   Otherwise, review of systems is positive for palpitations, snoring, wheezing,  shortness of breath, rash, balance problems, visual changes.   All other systems are reviewed and negative.   PHYSICAL EXAM: VS:  BP (!) 152/94   Pulse 80   Ht 5\' 5"  (1.651 m)   Wt 215 lb 9.6 oz (97.8 kg)   SpO2 96%   BMI 35.88 kg/m  , BMI Body mass index is 35.88 kg/m. GEN: Well nourished, well developed, in no acute distress  HEENT: normal  Neck: no JVD, carotid bruits, or masses Cardiac: RRR; no murmurs, rubs, or gallops,no edema  Respiratory:  clear to auscultation bilaterally,  normal work of breathing GI: soft, nontender, nondistended, + BS MS: no deformity or atrophy  Skin: warm and dry Neuro:  Strength and sensation are intact Psych: euthymic mood, full affect  EKG:  EKG is ordered today. Personal review of the ekg ordered shows sinus rhythm, rate 80, LVH  Recent Labs: 09/18/2017: ALT 57; BUN 16; Creatinine, Ser 0.93; Hemoglobin 11.9; Magnesium 1.9; Platelets 257; Potassium 4.2; Sodium 143; TSH 0.170    Lipid Panel  No results found for: CHOL, TRIG, HDL, CHOLHDL, VLDL, LDLCALC, LDLDIRECT   Wt Readings from Last 3 Encounters:  10/05/17 215 lb 9.6 oz (97.8 kg)  09/17/17 215 lb (97.5 kg)  09/02/17 217 lb 12.8 oz (98.8 kg)      Other studies Reviewed: Additional studies/ records that were reviewed today include: Myoview 10/06/16  Review of the above records today demonstrates:   Nuclear stress EF: 58%. The left ventricular ejection fraction is normal (55-65%).  The study is normal. There is breast attenuation. No ischemia . no evidence of infarction  This is a low risk study.  Holter 10/09/16 - personally reviewed  Basic rhythm is NSR rare PAC's and PVC's.  Brief SVT < 10 beats.  Heart rate range 43-113 bpm with average 63 bpm.   NSR Normal study  TTE 07/19/16 LEFT VENTRICLE The left ventricle is borderline dilated. There is normal left ventricular  wall thickness. Left ventricular systolic function is normal. LV ejection  fraction = 50-55%. Left ventricular filling pattern is indeterminate. No  segmental wall motion abnormalities seen in the left ventricle. LEFT ATRIUM The left atrium is mildly dilated. AORTIC VALVE The aortic valve is not well visualized. There is no aortic regurgitation.    ASSESSMENT AND PLAN:  1.  Atrial flutter with rapid ventricular response: Currently on Xarelto, carvedilol, and flecainide.  She would like to continue to try medical management as opposed to ablation.  We Jocelynn Gioffre increase her carvedilol to 25 mg  and increase her flecainide to 100 mg.  I Cecile Guevara see her back in 3 months for further discussions of ablation.  This patients CHA2DS2-VASc Score and unadjusted Ischemic Stroke Rate (% per year) is equal to 3.2 % stroke rate/year from a score of 3  Above score calculated as 1 point each if present [CHF, HTN, DM, Vascular=MI/PAD/Aortic Plaque, Age if 65-74, or Female] Above score calculated as 2 points each if present [Age > 75, or Stroke/TIA/TE]  2. Short PR interval: She has an accessory pathway but no obvious delta wave seen on her EKG.  This could be rapid AV conduction which would help explain her one-to-one tachycardia in 2018.  If she has more tachycardia, she would be agreeable to ablation.  3. Hypertension: Elevated today.  Plan to increase carvedilol  4. OSA: Had sleep study confirming sleep apnea.  Encouraged CPAP compliance.     Current medicines are reviewed at length with the patient today.  The patient does not have concerns regarding her medicines.  The following changes were made today: Increase flecainide, carvedilol  Labs/ tests ordered today include:  Orders Placed This Encounter  Procedures  . EKG 12-Lead     Disposition:   FU with Merlinda Wrubel 3 months  Signed, Arlene Brickel Jorja Loa, MD  10/05/2017 8:34 AM     [facility] [address] [place] [address] [phone] (office) [phone] (fax)

## 2017-10-05 NOTE — Patient Instructions (Addendum)
Medication Instructions:  Your physician has recommended you make the following change in your medication:  1.  Increase your flecainide-  Take 100 mg tablet by mouth twice a day (You can take your 50 mg tablets-two tablets by mouth- twice a day until they are gone) 2.  Increase your carvedilol- Take 25 mg tablet by mouth twice a day ( You can take your 12.5 mg tablets- two tablets by mouth- twice a day until they are gone)  Labwork: None ordered.  Testing/Procedures: None ordered.  Follow-Up: Your physician wants you to follow-up in: 3 months with Dr. Elberta Fortisamnitz.     Any Other Special Instructions Will Be Listed Below (If Applicable).  If you need a refill on your cardiac medications before your next appointment, please call your pharmacy.

## 2017-10-14 ENCOUNTER — Other Ambulatory Visit: Payer: Self-pay

## 2017-10-14 ENCOUNTER — Observation Stay (HOSPITAL_COMMUNITY)
Admission: EM | Admit: 2017-10-14 | Discharge: 2017-10-15 | Disposition: A | Discharge status: Home / Self Care | Payer: Medicare Other | Attending: Internal Medicine | Admitting: Internal Medicine

## 2017-10-14 ENCOUNTER — Encounter (HOSPITAL_COMMUNITY): Payer: Self-pay | Admitting: *Deleted

## 2017-10-14 ENCOUNTER — Emergency Department (HOSPITAL_COMMUNITY): Payer: Medicare Other

## 2017-10-14 DIAGNOSIS — G4730 Sleep apnea, unspecified: Secondary | ICD-10-CM | POA: Diagnosis not present

## 2017-10-14 DIAGNOSIS — Z79899 Other long term (current) drug therapy: Secondary | ICD-10-CM | POA: Diagnosis not present

## 2017-10-14 DIAGNOSIS — G4733 Obstructive sleep apnea (adult) (pediatric): Secondary | ICD-10-CM | POA: Insufficient documentation

## 2017-10-14 DIAGNOSIS — R079 Chest pain, unspecified: Secondary | ICD-10-CM | POA: Diagnosis not present

## 2017-10-14 DIAGNOSIS — I456 Pre-excitation syndrome: Secondary | ICD-10-CM | POA: Insufficient documentation

## 2017-10-14 DIAGNOSIS — K219 Gastro-esophageal reflux disease without esophagitis: Secondary | ICD-10-CM | POA: Diagnosis not present

## 2017-10-14 DIAGNOSIS — I4891 Unspecified atrial fibrillation: Secondary | ICD-10-CM | POA: Diagnosis present

## 2017-10-14 DIAGNOSIS — Z7901 Long term (current) use of anticoagulants: Secondary | ICD-10-CM | POA: Insufficient documentation

## 2017-10-14 DIAGNOSIS — R778 Other specified abnormalities of plasma proteins: Secondary | ICD-10-CM

## 2017-10-14 DIAGNOSIS — I4892 Unspecified atrial flutter: Secondary | ICD-10-CM | POA: Diagnosis not present

## 2017-10-14 DIAGNOSIS — E079 Disorder of thyroid, unspecified: Secondary | ICD-10-CM | POA: Insufficient documentation

## 2017-10-14 DIAGNOSIS — I10 Essential (primary) hypertension: Secondary | ICD-10-CM | POA: Insufficient documentation

## 2017-10-14 DIAGNOSIS — Z87891 Personal history of nicotine dependence: Secondary | ICD-10-CM | POA: Insufficient documentation

## 2017-10-14 DIAGNOSIS — R7989 Other specified abnormal findings of blood chemistry: Secondary | ICD-10-CM | POA: Diagnosis not present

## 2017-10-14 DIAGNOSIS — I480 Paroxysmal atrial fibrillation: Secondary | ICD-10-CM | POA: Diagnosis not present

## 2017-10-14 DIAGNOSIS — R748 Abnormal levels of other serum enzymes: Secondary | ICD-10-CM

## 2017-10-14 LAB — BASIC METABOLIC PANEL
Anion gap: 12 (ref 5–15)
BUN: 14 mg/dL (ref 8–23)
CO2: 24 mmol/L (ref 22–32)
Calcium: 9.2 mg/dL (ref 8.9–10.3)
Chloride: 107 mmol/L (ref 98–111)
Creatinine, Ser: 0.99 mg/dL (ref 0.44–1.00)
GFR calc Af Amer: >60 mL/min (ref >60)
GFR calc non Af Amer: 58 mL/min — ABNORMAL LOW (ref >60)
Glucose, Bld: 162 mg/dL — ABNORMAL HIGH (ref 70–99)
Potassium: 3.7 mmol/L (ref 3.5–5.1)
Sodium: 143 mmol/L (ref 135–145)

## 2017-10-14 LAB — CBC
HCT: 38.3 % (ref 36.0–46.0)
Hemoglobin: 12.1 g/dL (ref 12.0–15.0)
MCH: 26.5 pg (ref 26.0–34.0)
MCHC: 31.6 g/dL (ref 30.0–36.0)
MCV: 84 fL (ref 78.0–100.0)
Platelets: 262 10*3/uL (ref 150–400)
RBC: 4.56 MIL/uL (ref 3.87–5.11)
RDW: 16.4 % — ABNORMAL HIGH (ref 11.5–15.5)
WBC: 9.1 10*3/uL (ref 4.0–10.5)

## 2017-10-14 LAB — TROPONIN I: Troponin I: <0.03 ng/mL (ref <0.03)

## 2017-10-14 MED ORDER — SODIUM CHLORIDE 0.9 % IV BOLUS
1000.0000 mL | Freq: Once | INTRAVENOUS | Status: AC
  Administered 2017-10-14: 1000 mL via INTRAVENOUS

## 2017-10-14 MED ORDER — DILTIAZEM LOAD VIA INFUSION
20.0000 mg | Freq: Once | INTRAVENOUS | Status: AC
  Administered 2017-10-14: 20 mg via INTRAVENOUS
  Filled 2017-10-14 (×2): qty 20

## 2017-10-14 MED ORDER — DILTIAZEM LOAD VIA INFUSION
10.0000 mg | Freq: Once | INTRAVENOUS | Status: AC
  Administered 2017-10-14: 10 mg via INTRAVENOUS
  Filled 2017-10-14: qty 10

## 2017-10-14 MED ORDER — DILTIAZEM HCL 25 MG/5ML IV SOLN: 10.0000 mg | Freq: Once | INTRAVENOUS | Status: DC

## 2017-10-14 MED ORDER — DILTIAZEM HCL-DEXTROSE 100-5 MG/100ML-% IV SOLN (PREMIX)
5.0000 mg/h | INTRAVENOUS | Status: DC
  Administered 2017-10-14: 5 mg/h via INTRAVENOUS
  Administered 2017-10-15: 15 mg/h via INTRAVENOUS
  Filled 2017-10-14 (×3): qty 100

## 2017-10-14 NOTE — ED Notes (Signed)
Bed: XB28 Expected date:  Expected time:  Means of arrival:  Comments: Res. room

## 2017-10-14 NOTE — ED Triage Notes (Signed)
Pt says a couple of hours ago she started having palpitations and left arm pain. She says she has hx of aflutter, she takes Flecainide for the same, recently had an increase in her dose for the same. HR in triage 190-210, EDP notified of the same.

## 2017-10-14 NOTE — ED Notes (Signed)
Pt insisted on walking to the BR the first 2 times, causing her HR to increase.  Bedside commode suggested - pt agreed.

## 2017-10-14 NOTE — ED Notes (Signed)
Reports palpitations with R arm pain tonight.  She takes flecainide which was recently increased.  She denies any dizziness or lightheadedness or SOB.  She is A&Ox 4.  In NAD.  Family is at bedside.

## 2017-10-14 NOTE — ED Provider Notes (Signed)
[facility]-EMERGENCY DEPT Provider Note   CSN: [id number] Arrival date & time: 10/14/17  2037     History   Chief Complaint Chief Complaint  Patient presents with  . Palpitations    HPI Mary Poole is a 67 y.o. female.  HPI  67 year old female with a history of atrial fibrillation and atrial flutter presents with recurrent palpitations.  Started about 2 hours prior to arrival.  Had just eaten dinner.  She has been compliant with her meds and took her flecainide tonight after the symptoms began.  She had about an hour of left arm heaviness.  Typically gets chest pain shortness of breath but did not get that tonight.  The palpitations have improved.  Never had any dizziness or lightheadedness.  Past Medical History:  Diagnosis Date  . GERD (gastroesophageal reflux disease)   . H/O atrial flutter   . Hypertension   . Migraine   . Sleep apnea     Patient Active Problem List   Diagnosis Date Noted  . Atrial fibrillation with RVR (HCC) 09/18/2017  . Sinus bradycardia 09/18/2017  . OSA (obstructive sleep apnea)   . Thyroid disease 07/20/2017  . Atrial flutter (HCC) 06/09/2017  . Iron deficiency anemia due to chronic blood loss   . Normocytic anemia 02/22/2017  . Accessory atrioventricular pathway 09/17/2016  . On continuous oral anticoagulation 09/17/2016  . Snoring 09/17/2016  . Paroxysmal atrial fibrillation (HCC) 09/16/2016  . Essential hypertension 07/19/2016  . Sensorineural hearing loss (SNHL), bilateral 11/20/2015  . Laryngopharyngeal reflux (LPR) 11/20/2015  . Perennial allergic rhinitis 11/20/2015  . Tinnitus, bilateral 11/20/2015  . GERD (gastroesophageal reflux disease) 07/15/2011    Past Surgical History:  Procedure Laterality Date  . COLONOSCOPY N/A 02/24/2017   Procedure: COLONOSCOPY;  Surgeon: Jeani Hawking, MD;  Location: [facility] ENDOSCOPY;  Service: Gastroenterology;  Laterality: N/A;  . ENTEROSCOPY N/A 02/24/2017   Procedure:  ENTEROSCOPY;  Surgeon: Jeani Hawking, MD;  Location: [facility] ENDOSCOPY;  Service: Gastroenterology;  Laterality: N/A;  . HERNIA REPAIR    . WISDOM TOOTH EXTRACTION       OB History   None      Home Medications    Prior to Admission medications   Medication Sig Start Date End Date Taking? Authorizing Provider  carvedilol (COREG) 25 MG tablet Take 1 tablet (25 mg total) by mouth 2 (two) times daily. 10/05/17 01/03/18  Camnitz, Andree Coss, MD  flecainide (TAMBOCOR) 100 MG tablet Take 1 tablet (100 mg total) by mouth 2 (two) times daily. 10/05/17   Camnitz, Andree Coss, MD  omeprazole (PRILOSEC) 20 MG capsule Take 1 capsule (20 mg total) by mouth daily. 07/28/17   Everrett Coombe, DO  XARELTO 20 MG TABS tablet TAKE 1 TABLET BY MOUTH EVERY DAY 06/20/17   Lyn Records, MD    Family History Family History  Problem Relation Age of Onset  . CAD Father   . AAA (abdominal aortic aneurysm) Sister   . Thyroid disease Sister     Social History Social History   Tobacco Use  . Smoking status: Former Games developer  . Smokeless tobacco: Never Used  Substance Use Topics  . Alcohol use: No  . Drug use: No     Allergies   Diphenhydramine hcl   Review of Systems Review of Systems  Respiratory: Negative for shortness of breath.   Cardiovascular: Positive for palpitations. Negative for chest pain.  Neurological: Negative for dizziness and light-headedness.  All other systems reviewed and are negative.  Physical Exam Updated Vital Signs BP (!) 140/109 (BP Location: Right Arm)   Pulse (!) 204   Temp 98 F (36.7 C)   Resp 18   SpO2 97%   Physical Exam  Constitutional: She is oriented to person, place, and time. She appears well-developed and well-nourished. No distress.  HENT:  Head: Normocephalic and atraumatic.  Right Ear: External ear normal.  Left Ear: External ear normal.  Nose: Nose normal.  Eyes: Right eye exhibits no discharge. Left eye exhibits no discharge.  Cardiovascular:  Regular rhythm and normal heart sounds. Tachycardia present.  Pulmonary/Chest: Effort normal and breath sounds normal.  Abdominal: Soft. There is no tenderness.  Neurological: She is alert and oriented to person, place, and time.  Skin: Skin is warm and dry. She is not diaphoretic.  Nursing note and vitals reviewed.    ED Treatments / Results  Labs (all labs ordered are listed, but only abnormal results are displayed) Labs Reviewed  BASIC METABOLIC PANEL - Abnormal; Notable for the following components:      Result Value   Glucose, Bld 162 (*)    GFR calc non Af Amer 58 (*)    All other components within normal limits  CBC - Abnormal; Notable for the following components:   RDW 16.4 (*)    All other components within normal limits  TROPONIN I  TROPONIN I    EKG EKG Interpretation  Date/Time:  Friday October 14 2017 20:48:03 EDT Ventricular Rate:  166 PR Interval:    QRS Duration: 90 QT Interval:  246 QTC Calculation: 409 R Axis:   76 Text Interpretation:  Atrial fibrillation with rapid V-rate Ventricular premature complex Repolarization abnormality, prob rate related Confirmed by Pricilla Loveless [phone]) on 10/14/2017 9:19:43 PM   EKG Interpretation  Date/Time:  Friday October 14 2017 20:52:17 EDT Ventricular Rate:  111 PR Interval:    QRS Duration: 94 QT Interval:  303 QTC Calculation: 412 R Axis:   77 Text Interpretation:  Atrial flutter with predominant 2:1 AV block Repol abnrm, severe global ischemia (LM/MVD) afib with RVR resolved Confirmed by Pricilla Loveless [phone]) on 10/14/2017 9:21:37 PM        Radiology Dg Chest Portable 1 View  Result Date: 10/14/2017 CLINICAL DATA:  Chest pain. Atrial fibrillation. EXAM: PORTABLE CHEST 1 VIEW COMPARISON:  09/17/2017. FINDINGS: Interval enlarged cardiac silhouette with a mild increase in prominence of the pulmonary vasculature. Clear lungs. No pleural fluid. Unremarkable bones. IMPRESSION: Interval cardiomegaly and  mildly progressive pulmonary vascular congestion. Electronically Signed   By: Beckie Salts M.D.   On: 10/14/2017 21:42    Procedures .Critical Care Performed by: Pricilla Loveless, MD Authorized by: Pricilla Loveless, MD   Critical care provider statement:    Critical care time (minutes):  30   Critical care time was exclusive of:  Separately billable procedures and treating other patients   Critical care was necessary to treat or prevent imminent or life-threatening deterioration of the following conditions:  Circulatory failure   Critical care was time spent personally by me on the following activities:  Development of treatment plan with patient or surrogate, discussions with consultants, evaluation of patient's response to treatment, examination of patient, obtaining history from patient or surrogate, ordering and performing treatments and interventions, ordering and review of laboratory studies, ordering and review of radiographic studies, pulse oximetry, re-evaluation of patient's condition and review of old charts   (including critical care time)  Medications Ordered in ED Medications  diltiazem (CARDIZEM) 1 mg/mL  load via infusion 20 mg (20 mg Intravenous Bolus from Bag 10/14/17 2152)    And  diltiazem (CARDIZEM) 100 mg in dextrose 5% (1 mg/mL) infusion (10 mg/hr Intravenous Rate/Dose Change 10/14/17 2240)  sodium chloride 0.9 % bolus 1,000 mL (0 mLs Intravenous Stopped 10/14/17 2237)  diltiazem (CARDIZEM) 1 mg/mL load via infusion 10 mg (10 mg Intravenous Bolus from Bag 10/14/17 2237)     Initial Impression / Assessment and Plan / ED Course  I have reviewed the triage vital signs and the nursing notes.  Pertinent labs & imaging results that were available during my care of the patient were reviewed by me and considered in my medical decision making (see chart for details).     Upon first presentation, patient is in A. fib with RVR.  This quickly improved but was now in atrial  flutter with a heart rate of around 130.  Improved to around 110s.  I offered cardioversion as she is not normally in a flutter and is compliant with her Xarelto.  She declines because last time she had to be cardioverted twice and it did not take.  She is very hesitant to be admitted to the hospital on a Cardizem drip again.  After discussion, we have tried being on IV Cardizem bolus and drip to see if getting her heart rate low will spontaneously convert.  However this is not happened.  Given this with her heart rate still around 115 and sometimes higher while on 10 mg/hour of Cardizem, I think she will need admission for heart rate control.  Offered cardio version multiple times.  I think her left arm pressure was likely a reaction to the significant heart rate and she can get serial troponins but I doubt MI.  CHA2DS2/VAS Stroke Risk Points  Current as of 6 minutes ago     3 >= 2 Points: High Risk  1 - 1.99 Points: Medium Risk  0 Points: Low Risk    This is the only CHA2DS2/VAS Stroke Risk Points available for the past  year.:  Last Change: N/A     Details    This score determines the patient's risk of having a stroke if the  patient has atrial fibrillation.       Points Metrics  0 Has Congestive Heart Failure:  No    Current as of 6 minutes ago  0 Has Vascular Disease:  No    Current as of 6 minutes ago  1 Has Hypertension:  Yes    Current as of 6 minutes ago  1 Age:  64    Current as of 6 minutes ago  0 Has Diabetes:  No    Current as of 6 minutes ago  0 Had Stroke:  No  Had TIA:  No  Had thromboembolism:  No    Current as of 6 minutes ago  1 Female:  Yes    Current as of 6 minutes ago           Final Clinical Impressions(s) / ED Diagnoses   Final diagnoses:  Atrial flutter with rapid ventricular response [facility])    ED Discharge Orders    None       Pricilla Loveless, MD 10/15/17 [phone]

## 2017-10-15 ENCOUNTER — Ambulatory Visit (HOSPITAL_COMMUNITY)
Admission: EM | Admit: 2017-10-15 | Discharge: 2017-10-15 | Disposition: A | Discharge status: Home / Self Care | Payer: Medicare Other | Source: Home / Self Care | Attending: Cardiology | Admitting: Cardiology

## 2017-10-15 ENCOUNTER — Observation Stay (HOSPITAL_COMMUNITY): Payer: Medicare Other

## 2017-10-15 ENCOUNTER — Encounter (HOSPITAL_COMMUNITY): Payer: Self-pay

## 2017-10-15 DIAGNOSIS — G4730 Sleep apnea, unspecified: Secondary | ICD-10-CM | POA: Diagnosis not present

## 2017-10-15 DIAGNOSIS — Z79899 Other long term (current) drug therapy: Secondary | ICD-10-CM | POA: Diagnosis not present

## 2017-10-15 DIAGNOSIS — K219 Gastro-esophageal reflux disease without esophagitis: Secondary | ICD-10-CM | POA: Diagnosis not present

## 2017-10-15 DIAGNOSIS — I4892 Unspecified atrial flutter: Secondary | ICD-10-CM | POA: Diagnosis not present

## 2017-10-15 DIAGNOSIS — I480 Paroxysmal atrial fibrillation: Secondary | ICD-10-CM

## 2017-10-15 DIAGNOSIS — G4733 Obstructive sleep apnea (adult) (pediatric): Secondary | ICD-10-CM

## 2017-10-15 DIAGNOSIS — I10 Essential (primary) hypertension: Secondary | ICD-10-CM

## 2017-10-15 DIAGNOSIS — R7989 Other specified abnormal findings of blood chemistry: Secondary | ICD-10-CM | POA: Diagnosis not present

## 2017-10-15 DIAGNOSIS — E079 Disorder of thyroid, unspecified: Secondary | ICD-10-CM | POA: Diagnosis not present

## 2017-10-15 DIAGNOSIS — I456 Pre-excitation syndrome: Secondary | ICD-10-CM

## 2017-10-15 DIAGNOSIS — I4891 Unspecified atrial fibrillation: Secondary | ICD-10-CM | POA: Diagnosis present

## 2017-10-15 DIAGNOSIS — Z87891 Personal history of nicotine dependence: Secondary | ICD-10-CM | POA: Diagnosis not present

## 2017-10-15 DIAGNOSIS — Z7901 Long term (current) use of anticoagulants: Secondary | ICD-10-CM | POA: Diagnosis not present

## 2017-10-15 DIAGNOSIS — R748 Abnormal levels of other serum enzymes: Secondary | ICD-10-CM

## 2017-10-15 LAB — COMPREHENSIVE METABOLIC PANEL
ALT: 32 U/L (ref 0–44)
AST: 38 U/L (ref 15–41)
Albumin: 3.5 g/dL (ref 3.5–5.0)
Alkaline Phosphatase: 65 U/L (ref 38–126)
Anion gap: 10 (ref 5–15)
BUN: 13 mg/dL (ref 8–23)
CHLORIDE: 107 mmol/L (ref 98–111)
CO2: 25 mmol/L (ref 22–32)
CREATININE: 0.84 mg/dL (ref 0.44–1.00)
Calcium: 9 mg/dL (ref 8.9–10.3)
GFR calc Af Amer: >60 mL/min (ref >60)
Glucose, Bld: 122 mg/dL — ABNORMAL HIGH (ref 70–99)
Potassium: 3.9 mmol/L (ref 3.5–5.1)
Sodium: 142 mmol/L (ref 135–145)
Total Bilirubin: 0.6 mg/dL (ref 0.3–1.2)
Total Protein: 7 g/dL (ref 6.5–8.1)

## 2017-10-15 LAB — TROPONIN I
TROPONIN I: 0.16 ng/mL — AB (ref <0.03)
Troponin I: 0.23 ng/mL (ref <0.03)

## 2017-10-15 LAB — TSH: TSH: 0.452 u[IU]/mL (ref 0.350–4.500)

## 2017-10-15 LAB — PHOSPHORUS: Phosphorus: 3.1 mg/dL (ref 2.5–4.6)

## 2017-10-15 LAB — CBC
HCT: 36.9 % (ref 36.0–46.0)
Hemoglobin: 11.9 g/dL — ABNORMAL LOW (ref 12.0–15.0)
MCH: 26.8 pg (ref 26.0–34.0)
MCHC: 32.2 g/dL (ref 30.0–36.0)
MCV: 83.1 fL (ref 78.0–100.0)
PLATELETS: 247 10*3/uL (ref 150–400)
RBC: 4.44 MIL/uL (ref 3.87–5.11)
RDW: 16.3 % — ABNORMAL HIGH (ref 11.5–15.5)
WBC: 9.2 10*3/uL (ref 4.0–10.5)

## 2017-10-15 LAB — MAGNESIUM: Magnesium: 1.9 mg/dL (ref 1.7–2.4)

## 2017-10-15 MED ORDER — IOPAMIDOL (ISOVUE-370) INJECTION 76%
80.0000 mL | Freq: Once | INTRAVENOUS | Status: AC | PRN
  Administered 2017-10-15: 80 mL via INTRAVENOUS

## 2017-10-15 MED ORDER — SODIUM CHLORIDE 0.9% FLUSH: 3.0000 mL | INTRAVENOUS | Status: DC | PRN

## 2017-10-15 MED ORDER — SODIUM CHLORIDE 0.9% FLUSH
3.0000 mL | Freq: Two times a day (BID) | INTRAVENOUS | Status: DC
  Administered 2017-10-15 (×2): 3 mL via INTRAVENOUS

## 2017-10-15 MED ORDER — FLECAINIDE ACETATE 100 MG PO TABS
100.0000 mg | ORAL_TABLET | Freq: Two times a day (BID) | ORAL | Status: DC
  Administered 2017-10-15: 100 mg via ORAL
  Filled 2017-10-15 (×3): qty 1

## 2017-10-15 MED ORDER — CARVEDILOL 12.5 MG PO TABS
25.0000 mg | ORAL_TABLET | Freq: Two times a day (BID) | ORAL | Status: DC
  Administered 2017-10-15: 25 mg via ORAL
  Filled 2017-10-15 (×2): qty 1

## 2017-10-15 MED ORDER — ACETAMINOPHEN 325 MG PO TABS: 650.0000 mg | ORAL_TABLET | Freq: Four times a day (QID) | ORAL | Status: DC | PRN

## 2017-10-15 MED ORDER — RIVAROXABAN 20 MG PO TABS
20.0000 mg | ORAL_TABLET | Freq: Every day | ORAL | Status: DC
  Filled 2017-10-15: qty 1

## 2017-10-15 MED ORDER — ONDANSETRON HCL 4 MG/2ML IJ SOLN: 4.0000 mg | Freq: Four times a day (QID) | INTRAMUSCULAR | Status: DC | PRN

## 2017-10-15 MED ORDER — ONDANSETRON HCL 4 MG PO TABS: 4.0000 mg | ORAL_TABLET | Freq: Four times a day (QID) | ORAL | Status: DC | PRN

## 2017-10-15 MED ORDER — HYDROCODONE-ACETAMINOPHEN 5-325 MG PO TABS: 1.0000 | ORAL_TABLET | ORAL | Status: DC | PRN

## 2017-10-15 MED ORDER — NITROGLYCERIN 0.4 MG SL SUBL
SUBLINGUAL_TABLET | SUBLINGUAL | Status: AC
  Filled 2017-10-15: qty 2

## 2017-10-15 MED ORDER — SODIUM CHLORIDE 0.9 % IV SOLN: 250.0000 mL | INTRAVENOUS | Status: DC | PRN

## 2017-10-15 MED ORDER — ACETAMINOPHEN 650 MG RE SUPP: 650.0000 mg | Freq: Four times a day (QID) | RECTAL | Status: DC | PRN

## 2017-10-15 MED ORDER — PANTOPRAZOLE SODIUM 40 MG PO TBEC
40.0000 mg | DELAYED_RELEASE_TABLET | Freq: Every day | ORAL | Status: DC
  Administered 2017-10-15: 40 mg via ORAL
  Filled 2017-10-15 (×2): qty 1

## 2017-10-15 NOTE — ED Notes (Signed)
I have spoken with Dr. Butler Denmark; who wishes the coronary CT to be performed today. I thence spoke with [place], CT tech. At [facility], who tells me they are aware of CT and are ready to perform the study whenever the pt. arrives in their department. As I write this, I also have just notified [facility] for transport to [facility] Radiology, thence to return here for admission.

## 2017-10-15 NOTE — ED Notes (Addendum)
Still no response from [facility] NP floor cvg provider

## 2017-10-15 NOTE — ED Notes (Addendum)
Floor cvg MD contacted re pt's delta trop and cardizem gtt at a maximum dose

## 2017-10-15 NOTE — ED Notes (Signed)
Patient was sent to CT from [facility] ED for cardiac CT. CT negative and plans to discharge. Patient discharged by [facility] ED to prevent return to [facility] ED for discharge. Patient alert and oriented, denies pain. ambulatory

## 2017-10-15 NOTE — Consult Note (Signed)
Cardiology Consultation:   Patient ID: Mary Poole MRN: [id number]; DOB: [birth year]  Admit date: 10/14/2017 Date of Consult: 10/15/2017  Primary Care Provider: Everrett Coombe, DO Primary Cardiologist: Dr Katrinka Blazing Primary Electrophysiologist:  Dr [name]   Patient Profile:   Mary Poole is a 67 y.o. female with a hx of paroxysmal atrial flutter who is being seen today for the evaluation of atrial flutter  at the request of Therisa Doyne, MD.  History of Present Illness:   Mary Poole is a 67 y.o. female who is being seen today for the evaluation of atrial flutter, followed by Dr [name], last seen on 10/05/2017. She has a history of hypertension, OSA, PAF, parox atrial flutter with 1:1 conduction via accessory pathway. She was diagnosed with atrial fibrillation on 07/18/16. She had 2 episodes of chest pressure with radiation to her arm in late May or early June. It lasted 20 minutes. EMS was called but she felt better. 10 days later, she had similar symptoms.  Went to the emergency room where she had a wide-complex tachycardia.  While in the emergency room, her rate slowed and showed a likely atrial flutter.  It was thought that this was the same rhythm with the wide-complex tachycardia being apparent 1-1 conduction.  She reverted to sinus rhythm with IV medications.  She has had multiple ER visits for palpitations that turned out to be rapid atrial flutter.  She did require cardioversion at one point. Last admission on 09/18/2017. He has been compliant with all of her medications.  She is trying to exercise more often.  She has not found any exacerbating or alleviating factors for her palpitations. She was seen by Dr [name] on 10/05/2017, she was in SR at that time, ablation was offered but she wanted to continue medical management. This is the second time she refused an ablation. Her flecainide was increased to 100 mg PO BID.  She presented yesterday with palpitations, ECG showed atrial flutter  with 2:1 conduction.  Troponin elevated with 0.03_0.16->0.23.  Past Medical History:  Diagnosis Date  . GERD (gastroesophageal reflux disease)   . H/O atrial flutter   . Hypertension   . Migraine   . Sleep apnea     Past Surgical History:  Procedure Laterality Date  . COLONOSCOPY N/A 02/24/2017   Procedure: COLONOSCOPY;  Surgeon: Jeani Hawking, MD;  Location: [facility] ENDOSCOPY;  Service: Gastroenterology;  Laterality: N/A;  . ENTEROSCOPY N/A 02/24/2017   Procedure: ENTEROSCOPY;  Surgeon: Jeani Hawking, MD;  Location: [facility] ENDOSCOPY;  Service: Gastroenterology;  Laterality: N/A;  . HERNIA REPAIR    . WISDOM TOOTH EXTRACTION      Inpatient Medications: Scheduled Meds: . carvedilol  25 mg Oral BID WC  . flecainide  100 mg Oral BID  . pantoprazole  40 mg Oral Daily  . rivaroxaban  20 mg Oral Daily  . sodium chloride flush  3 mL Intravenous Q12H   Continuous Infusions: . sodium chloride    . diltiazem (CARDIZEM) infusion 5 mg/hr (10/15/17 0650)   PRN Meds: sodium chloride, acetaminophen **OR** acetaminophen, HYDROcodone-acetaminophen, ondansetron **OR** ondansetron (ZOFRAN) IV, sodium chloride flush  Allergies:    Allergies  Allergen Reactions  . Diphenhydramine Hcl Other (See Comments)    Social History:   Social History   Socioeconomic History  . Marital status: Married    Spouse name: Not on file  . Number of children: Not on file  . Years of education: Not on file  . Highest education level: Not on  file  Occupational History  . Not on file  Social Needs  . Financial resource strain: Not hard at all  . Food insecurity:    Worry: Never true    Inability: Never true  . Transportation needs:    Medical: No    Non-medical: No  Tobacco Use  . Smoking status: Former Games developer  . Smokeless tobacco: Never Used  Substance and Sexual Activity  . Alcohol use: No  . Drug use: No  . Sexual activity: Not on file  Lifestyle  . Physical activity:    Days per week: Not on file      Minutes per session: Not on file  . Stress: Not on file  Relationships  . Social connections:    Talks on phone: Not on file    Gets together: Not on file    Attends religious service: Not on file    Active member of club or organization: Not on file    Attends meetings of clubs or organizations: Not on file    Relationship status: Not on file  . Intimate partner violence:    Fear of current or ex partner: Not on file    Emotionally abused: Not on file    Physically abused: Not on file    Forced sexual activity: Not on file  Other Topics Concern  . Not on file  Social History Narrative  . Not on file    Family History:    Family History  Problem Relation Age of Onset  . CAD Father   . AAA (abdominal aortic aneurysm) Sister   . Thyroid disease Sister     ROS:  Please see the history of present illness.  All other ROS reviewed and negative.     Physical Exam/Data:   Vitals:   10/15/17 0647 10/15/17 0700 10/15/17 0730 10/15/17 0832  BP: 108/66 (!) 148/78 139/80 (!) 148/108  Pulse: 78 (!) 52 77 89  Resp: 18 16 15 17   Temp:    98.6 F (37 C)  TempSrc:    Oral  SpO2: 100% 99% 100% 100%    Intake/Output Summary (Last 24 hours) at 10/15/2017 0959 Last data filed at 10/14/2017 2237 Gross per 24 hour  Intake 1000 ml  Output -  Net 1000 ml   There were no vitals filed for this visit. There is no height or weight on file to calculate BMI.  General:  Well nourished, well developed, in no acute distress HEENT: normal Lymph: no adenopathy Neck: no JVD Endocrine:  No thryomegaly Vascular: No carotid bruits; FA pulses 2+ bilaterally without bruits  Cardiac:  normal S1, S2; RRR; no murmur  Lungs:  clear to auscultation bilaterally, no wheezing, rhonchi or rales  Abd: soft, nontender, no hepatomegaly  Ext: no edema Musculoskeletal:  No deformities, BUE and BLE strength normal and equal Skin: warm and dry  Neuro:  CNs 2-12 intact, no focal abnormalities noted Psych:   Normal affect   EKG:  The EKG was personally reviewed and demonstrates:  Atrial flutter with 2:1 block Telemetry:  Telemetry was personally reviewed and demonstrates:  Atrial flutter with RVR --> sinus bradycardia  Relevant CV Studies:  Laboratory Data:  Chemistry Recent Labs  Lab 10/14/17 2057 10/15/17 0517  NA 143 142  K 3.7 3.9  CL 107 107  CO2 24 25  GLUCOSE 162* 122*  BUN 14 13  CREATININE 0.99 0.84  CALCIUM 9.2 9.0  GFRNONAA 58* >60  GFRAA >60 >60  ANIONGAP 12 10  Recent Labs  Lab 10/15/17 0517  PROT 7.0  ALBUMIN 3.5  AST 38  ALT 32  ALKPHOS 65  BILITOT 0.6   Hematology Recent Labs  Lab 10/14/17 2057 10/15/17 0517  WBC 9.1 9.2  RBC 4.56 4.44  HGB 12.1 11.9*  HCT 38.3 36.9  MCV 84.0 83.1  MCH 26.5 26.8  MCHC 31.6 32.2  RDW 16.4* 16.3*  PLT 262 247   Cardiac Enzymes Recent Labs  Lab 10/14/17 2057 10/15/17 0123 10/15/17 0734  TROPONINI <0.03 0.16* 0.23*   No results for input(s): TROPIPOC in the last 168 hours.  BNPNo results for input(s): BNP, PROBNP in the last 168 hours.  DDimer No results for input(s): DDIMER in the last 168 hours.  Radiology/Studies:  Dg Chest Portable 1 View  Result Date: 10/14/2017 CLINICAL DATA:  Chest pain. Atrial fibrillation. EXAM: PORTABLE CHEST 1 VIEW COMPARISON:  09/17/2017. FINDINGS: Interval enlarged cardiac silhouette with a mild increase in prominence of the pulmonary vasculature. Clear lungs. No pleural fluid. Unremarkable bones. IMPRESSION: Interval cardiomegaly and mildly progressive pulmonary vascular congestion. Electronically Signed   By: Beckie Salts M.D.   On: 10/14/2017 21:42   TTE: 09/18/2017 - Left ventricle: The cavity size was normal. Wall thickness was   increased in a pattern of mild LVH. Systolic function was normal.   The estimated ejection fraction was in the range of 55% to 60%.   Wall motion was normal; there were no regional wall motion   abnormalities. Doppler parameters are consistent  with restrictive   physiology, indicative of decreased left ventricular diastolic   compliance and/or increased left atrial pressure. - Mitral valve: Calcified annulus. There was moderate   regurgitation. - Left atrium: The atrium was severely dilated. - Atrial septum: The septum bowed from left to right, consistent   with increased left atrial pressure. - Pulmonary arteries: Systolic pressure was moderately increased.   PA peak pressure: 52 mm Hg (S).  Impressions: - Normal LV function; mild LVH; restrictive filling; moderate MR;   severe LAE; mild TR with moderate pulmonary hypertension.   Assessment and Plan:   1.  Atrial flutter with rapid ventricular response, alternating 2:1 block and variable block, cardioverted to SR this am, now bradycardic, we will hold cardizem drip and continue flecainide 100 mg po BID, carvedilol, xarelto. She is now more opened to ablation, she will consider it and discuss with Dr [name] at the next visit.  This patients CHA2DS2-VASc Score and unadjusted Ischemic Stroke Rate (% per year) is equal to 3.2 % stroke rate/year from a score of 3  2. Elevated troponin - most probably demand ischemia with RVR, however while being on flecainide we need to exclude CAD. I will obtain coronary CTA today, if normal, she can be discharged. We will arrange for a follow up.   3. Short PR interval: She has an accessory pathway but no obvious delta wave seen on her EKG.  This could be rapid AV conduction which would help explain her one-to-one tachycardia in 2018.  If she has more tachycardia, she would be agreeable to ablation.  4. Hypertension: Elevated today.  Plan to increase carvedilol  4. OSA: Had sleep study confirming sleep apnea.  Encouraged CPAP compliance.  For questions or updates, please contact [facility] Please consult [email or web address] for contact info under   Signed, Tobias Alexander, MD  10/15/2017 9:59 AM

## 2017-10-15 NOTE — ED Notes (Signed)
ED TO INPATIENT HANDOFF REPORT  Name/Age/Gender Mary Poole 67 y.o. female  Code Status    Code Status Orders  (From admission, onward)         Start     Ordered   10/15/17 0320  Full code  Continuous     10/15/17 0319        Code Status History    Date Active Date Inactive Code Status Order ID Comments User Context   09/18/2017 0131 09/18/2017 1853 Full Code [id number]  Toy Baker, MD Inpatient   02/22/2017 2124 02/24/2017 2221 Full Code [id number]  Elwin Mocha, MD ED      Home/SNF/Other Home  Chief Complaint chest pain   Level of Care/Admitting Diagnosis ED Disposition    ED Disposition Condition [facility] Area: [facility] [100102]  Level of Care: Stepdown [14]  Admit to SDU based on following criteria: Hemodynamic compromise or significant risk of instability:  Patient requiring short term acute titration and management of vasoactive drips, and invasive monitoring (i.e., CVP and Arterial line).  Diagnosis: A-fib [facility]) [009381]  Admitting Physician: Toy Baker [[id number]]  Attending Physician: Toy Baker [[id number]]  PT Class (Do Not Modify): Observation [104]  PT Acc Code (Do Not Modify): Observation [10022]       Medical History Past Medical History:  Diagnosis Date  . GERD (gastroesophageal reflux disease)   . H/O atrial flutter   . Hypertension   . Migraine   . Sleep apnea     Allergies Allergies  Allergen Reactions  . Diphenhydramine Hcl Other (See Comments)    IV Location/Drains/Wounds Patient Lines/Drains/Airways Status   Active Line/Drains/Airways    Name:   Placement date:   Placement time:   Site:   Days:   Peripheral IV 10/14/17 Right Antecubital   10/14/17    2058    Antecubital   1          Labs/Imaging Results for orders placed or performed during the hospital encounter of 10/14/17 (from the past 48 hour(s))  Basic metabolic panel     Status: Abnormal   Collection Time:  10/14/17  8:57 PM  Result Value Ref Range   Sodium 143 135 - 145 mmol/L   Potassium 3.7 3.5 - 5.1 mmol/L   Chloride 107 98 - 111 mmol/L   CO2 24 22 - 32 mmol/L   Glucose, Bld 162 (H) 70 - 99 mg/dL   BUN 14 8 - 23 mg/dL   Creatinine, Ser 0.99 0.44 - 1.00 mg/dL   Calcium 9.2 8.9 - 10.3 mg/dL   GFR calc non Af Amer 58 (L) >60 mL/min   GFR calc Af Amer >60 >60 mL/min    Comment: (NOTE) The eGFR has been calculated using the CKD EPI equation. This calculation has not been validated in all clinical situations. eGFR's persistently <60 mL/min signify possible Chronic Kidney Disease.    Anion gap 12 5 - 15    Comment: Performed at [facility], [place] [address], [place], [place] [address]  CBC     Status: Abnormal   Collection Time: 10/14/17  8:57 PM  Result Value Ref Range   WBC 9.1 4.0 - 10.5 K/uL   RBC 4.56 3.87 - 5.11 MIL/uL   Hemoglobin 12.1 12.0 - 15.0 g/dL   HCT 38.3 36.0 - 46.0 %   MCV 84.0 78.0 - 100.0 fL   MCH 26.5 26.0 - 34.0 pg   MCHC 31.6 30.0 -  36.0 g/dL   RDW 16.4 (H) 11.5 - 15.5 %   Platelets 262 150 - 400 K/uL    Comment: Performed at [facility], [place] [address], [place], [place] [address]  Troponin I     Status: None   Collection Time: 10/14/17  8:57 PM  Result Value Ref Range   Troponin I <0.03 <0.03 ng/mL    Comment: Performed at [facility], [place] [address] [place], [place] [address]  Troponin I     Status: Abnormal   Collection Time: 10/15/17  1:23 AM  Result Value Ref Range   Troponin I 0.16 (HH) <0.03 ng/mL    Comment: CRITICAL RESULT CALLED TO, READ BACK BY AND VERIFIED WITH: GIBSON,K AT 0214 ON 10/15/2017 BY MOSLEY,J Performed at [facility], [place] [address], [place], [place] [address]   Magnesium     Status: None   Collection Time: 10/15/17  5:17 AM  Result Value Ref Range   Magnesium 1.9 1.7 - 2.4 mg/dL    Comment: Performed at [facility], [place] [address], [place], [place] [address]  Phosphorus     Status: None   Collection Time: 10/15/17  5:17 AM  Result Value Ref Range   Phosphorus 3.1 2.5 - 4.6 mg/dL    Comment: Performed at [facility], [place] [address] [place], [place] [address]  TSH     Status: None   Collection Time: 10/15/17  5:17 AM  Result Value Ref Range   TSH 0.452 0.350 - 4.500 uIU/mL    Comment: Performed by a 3rd Generation assay with a functional sensitivity of <=0.01 uIU/mL. Performed at [facility], [place] [address], [place], [place] [address]   Comprehensive metabolic panel     Status: Abnormal   Collection Time: 10/15/17  5:17 AM  Result Value Ref Range   Sodium 142 135 - 145 mmol/L   Potassium 3.9 3.5 - 5.1 mmol/L   Chloride 107 98 - 111 mmol/L   CO2 25 22 - 32 mmol/L   Glucose, Bld 122 (H) 70 - 99 mg/dL   BUN 13 8 - 23 mg/dL   Creatinine, Ser 0.84 0.44 - 1.00 mg/dL   Calcium 9.0 8.9 - 10.3 mg/dL   Total Protein 7.0 6.5 - 8.1 g/dL   Albumin 3.5 3.5 - 5.0 g/dL   AST 38 15 - 41 U/L   ALT 32 0 - 44 U/L   Alkaline Phosphatase 65 38 - 126 U/L   Total Bilirubin 0.6 0.3 - 1.2 mg/dL   GFR calc non Af Amer >60 >60 mL/min   GFR calc Af Amer >60 >60 mL/min    Comment: (NOTE) The eGFR has been calculated using the CKD EPI equation. This calculation has not been validated in all clinical situations. eGFR's persistently <60 mL/min signify possible Chronic Kidney Disease.    Anion gap 10 5 - 15    Comment: Performed at [facility], [place] [address] [place], [place] [address]  CBC     Status: Abnormal   Collection Time: 10/15/17  5:17 AM  Result Value Ref Range   WBC 9.2 4.0 - 10.5 K/uL   RBC 4.44 3.87 - 5.11 MIL/uL   Hemoglobin 11.9 (L) 12.0 - 15.0 g/dL   HCT 36.9 36.0 - 46.0 %   MCV 83.1 78.0 - 100.0 fL   MCH 26.8 26.0 - 34.0 pg   MCHC 32.2 30.0 - 36.0 g/dL  RDW 16.3 (H) 11.5 - 15.5 %   Platelets 247 150 - 400 K/uL    Comment: Performed at [facility], [place] [address], [place], [place] [address]  Troponin I (q 6hr x 3)     Status: Abnormal   Collection Time: 10/15/17  7:34 AM  Result Value Ref Range   Troponin I 0.23 (HH) <0.03 ng/mL    Comment: CRITICAL VALUE NOTED.  VALUE IS CONSISTENT WITH PREVIOUSLY REPORTED AND CALLED VALUE. Performed at [facility], [place] [address], [place], [place] [address]    Dg Chest Portable 1 View  Result Date: 10/14/2017 CLINICAL DATA:  Chest pain. Atrial fibrillation. EXAM: PORTABLE CHEST 1 VIEW COMPARISON:  09/17/2017. FINDINGS: Interval enlarged cardiac silhouette with a mild increase in prominence of the pulmonary vasculature. Clear lungs. No pleural fluid. Unremarkable bones. IMPRESSION: Interval cardiomegaly and mildly progressive pulmonary vascular congestion. Electronically Signed   By: Claudie Revering M.D.   On: 10/14/2017 21:42    Pending Labs Unresulted Labs (From admission, onward)    Start     Ordered   10/15/17 0500  Troponin I (q 6hr x 3)  Now then every 6 hours,   R     10/15/17 0102          Vitals/Pain Today's Vitals   10/15/17 0900 10/15/17 0930 10/15/17 1000 10/15/17 1030  BP: (!) 144/96 (!) 150/78 (!) 145/78 96/82  Pulse: (!) 120 (!) 51 (!) 52 (!) 51  Resp: (!) 26 20 (!) 22 19  Temp:      TempSrc:      SpO2: 100% 100% 100% 100%  PainSc:        Isolation Precautions No active isolations  Medications Medications  diltiazem (CARDIZEM) 1 mg/mL load via infusion 20 mg (20 mg Intravenous Bolus from Bag 10/14/17 2152)    And  diltiazem (CARDIZEM) 100 mg in dextrose 5% 148m (1 mg/mL) infusion (0 mg/hr Intravenous Stopped 10/15/17 1029)  carvedilol (COREG) tablet 25 mg (25 mg Oral Given 10/15/17 1108)  flecainide (TAMBOCOR) tablet 100 mg (100 mg Oral Given 10/15/17 1109)  pantoprazole (PROTONIX) EC tablet 40 mg (40 mg Oral Given 10/15/17 1108)  rivaroxaban (XARELTO) tablet 20 mg (has no administration in time range)  acetaminophen (TYLENOL) tablet  650 mg (has no administration in time range)    Or  acetaminophen (TYLENOL) suppository 650 mg (has no administration in time range)  HYDROcodone-acetaminophen (NORCO/VICODIN) 5-325 MG per tablet 1-2 tablet (has no administration in time range)  ondansetron (ZOFRAN) tablet 4 mg (has no administration in time range)    Or  ondansetron (ZOFRAN) injection 4 mg (has no administration in time range)  sodium chloride flush (NS) 0.9 % injection 3 mL (3 mLs Intravenous Given 10/15/17 1037)  sodium chloride flush (NS) 0.9 % injection 3 mL (has no administration in time range)  0.9 %  sodium chloride infusion (has no administration in time range)  sodium chloride 0.9 % bolus 1,000 mL (0 mLs Intravenous Stopped 10/14/17 2237)  diltiazem (CARDIZEM) 1 mg/mL load via infusion 10 mg (10 mg Intravenous Bolus from Bag 10/14/17 2237)    Mobility walks with person assist

## 2017-10-15 NOTE — ED Notes (Signed)
Paged [facility] again about Pt's Troponin. Waiting for response

## 2017-10-15 NOTE — Discharge Summary (Signed)
Physician Discharge Summary  Mary Poole ZOX:[id number] DOB: 16-Apr-1950 DOA: 10/14/2017  PCP: Everrett Coombe, DO  Admit date: 10/14/2017 Discharge date: 10/15/2017  Admitted From: home  Disposition:  home   Recommendations for Outpatient Follow-up:  1. F/u with Dr [name]   Discharge Condition:  stable   CODE STATUS:  Full code   Consultations:  Cardiology     Discharge Diagnoses:  Principal Problem:   Atrial flutter with rapid ventricular response (HCC) Active Problems:   Essential hypertension   GERD (gastroesophageal reflux disease)   Accessory atrioventricular pathway   On continuous oral anticoagulation   Thyroid disease   OSA (obstructive sleep apnea)       Brief Summary:  Mary Poole is a 67 y.o. female with medical history significant of paroxysmal a.flutter s/p cardioversion now on Flecainide and Coreg, GERD, HTN, OSA,GERDwhose Flecainide was recently increased from 50 to 100 mg by her EP doctor. She has declined ablation in the past. She presented to the ED for palpitations and pain radiating down her left arm. She was found to be in rapid A-flutter. She was placed on a Cardizem infusion and admitted to the hospital but still in the ED when I evaluated her this AM.   A-flutter - has declined ablation  - soon after being started on Cardizem infusion, she converted back to NSR with HR in 50s- Cardizem infusion stopped - cont Coreg, Flecainide at 100 mg SID and Xarelto per cardiology who has evaluated her today - she will f/u with Dr [name] to discuss further options  Mildly elevated Troponin 0.16>> 0.23  - likely due to demand ischemia related to RVR (with left arm pain) - CTA ordered by cardiology for further work up show mild non-obstructive CAD - per my conversation with Dr Delton See, ok to discharge home on current medications    Discharge Exam: Vitals:   10/15/17 1000 10/15/17 1030  BP: (!) 145/78 96/82  Pulse: (!) 52 (!) 51  Resp: (!) 22 19  Temp:     SpO2: 100% 100%   Vitals:   10/15/17 0900 10/15/17 0930 10/15/17 1000 10/15/17 1030  BP: (!) 144/96 (!) 150/78 (!) 145/78 96/82  Pulse: (!) 120 (!) 51 (!) 52 (!) 51  Resp: (!) 26 20 (!) 22 19  Temp:      TempSrc:      SpO2: 100% 100% 100% 100%    General: Pt is alert, awake, not in acute distress Cardiovascular: RRR, S1/S2 +, no rubs, no gallops Respiratory: CTA bilaterally, no wheezing, no rhonchi Abdominal: Soft, NT, ND, bowel sounds + Extremities: no edema, no cyanosis   Discharge Instructions  Discharge Instructions    Diet - low sodium heart healthy   Complete by:  As directed    Increase activity slowly   Complete by:  As directed      Allergies as of 10/15/2017      Reactions   Diphenhydramine Hcl Other (See Comments)      Medication List    TAKE these medications   carvedilol 25 MG tablet Commonly known as:  COREG Take 1 tablet (25 mg total) by mouth 2 (two) times daily.   flecainide 100 MG tablet Commonly known as:  TAMBOCOR Take 1 tablet (100 mg total) by mouth 2 (two) times daily.   omeprazole 20 MG capsule Commonly known as:  PRILOSEC Take 1 capsule (20 mg total) by mouth daily.   XARELTO 20 MG Tabs tablet Generic drug:  rivaroxaban TAKE 1 TABLET BY  MOUTH EVERY DAY       Allergies  Allergen Reactions  . Diphenhydramine Hcl Other (See Comments)     Procedures/Studies:   Dg Chest 2 View  Result Date: 09/17/2017 CLINICAL DATA:  Dyspnea with chest palpitations. History of atrial fibrillation and atrial flutter. EXAM: CHEST - 2 VIEW COMPARISON:  None. FINDINGS: Top-normal heart size. Nonaneurysmal thoracic aorta. Mild pulmonary vascular congestion without acute pulmonary consolidation, effusion or pneumothorax. No acute osseous abnormality. IMPRESSION: Mild central vascular congestion without acute pulmonary consolidation. Electronically Signed   By: Tollie Eth M.D.   On: 09/17/2017 20:38   Ct Coronary Morph W/cta Cor W/score W/ca W/cm &/or  Wo/cm  Result Date: 10/15/2017 CLINICAL DATA:  67 year old female with recurrent atrial flutter with RVR and elevated troponin. EXAM: Cardiac/Coronary  CT TECHNIQUE: The patient was scanned on a [employer]. FINDINGS: A 120 kV prospective scan was triggered in the descending thoracic aorta at 111 HU's. Axial non-contrast 3 mm slices were carried out through the heart. The data set was analyzed on a dedicated work station and scored using the Agatson method. Gantry rotation speed was 250 msecs and collimation was .6 mm. No beta blockade and 0.8 mg of sl NTG was given. The 3D data set was reconstructed in 5% intervals of the 67-82 % of the R-R cycle. Diastolic phases were analyzed on a dedicated work station using MPR, MIP and VRT modes. The patient received 80 cc of contrast. Aorta:  Normal size.  Mild calcifications.  No dissection. Aortic Valve:  Trileaflet.  No calcifications. Coronary Arteries:  Normal coronary origin.  Right dominance. RCA is a large dominant artery that gives rise to PDA and PLVB. There is mild calcified plaque in the proximal portion with associated stenosis 25-50%. Left main is a large artery that gives rise to LAD and LCX arteries. Left main has minimal plaque. LAD is a large vessel that has gives rise to two small diagonal arteries, proximal LAD has long mixed plaque with stenosis 25-50% and possible focal 50-69%. LCX is a non-dominant artery that gives rise to two small OM branches. There is no plaque. Other findings: Normal pulmonary vein drainage into the left atrium. Normal let atrial appendage without a thrombus. Normal size of the pulmonary artery. IMPRESSION: 1. Coronary calcium score of 106. This was 51 percentile for age and sex matched control. 2. Normal coronary origin with right dominance. 3. Mild non-obstructive CAD. Aggressive risk factor modification is recommended. Electronically Signed   By: Tobias Alexander   On: 10/15/2017 13:06   Dg Chest Portable 1  View  Result Date: 10/14/2017 CLINICAL DATA:  Chest pain. Atrial fibrillation. EXAM: PORTABLE CHEST 1 VIEW COMPARISON:  09/17/2017. FINDINGS: Interval enlarged cardiac silhouette with a mild increase in prominence of the pulmonary vasculature. Clear lungs. No pleural fluid. Unremarkable bones. IMPRESSION: Interval cardiomegaly and mildly progressive pulmonary vascular congestion. Electronically Signed   By: Beckie Salts M.D.   On: 10/14/2017 21:42     The results of significant diagnostics from this hospitalization (including imaging, microbiology, ancillary and laboratory) are listed below for reference.     Microbiology: No results found for this or any previous visit (from the past 240 hour(s)).   Labs: BNP (last 3 results) No results for input(s): BNP in the last 8760 hours. Basic Metabolic Panel: Recent Labs  Lab 10/14/17 2057 10/15/17 0517  NA 143 142  K 3.7 3.9  CL 107 107  CO2 24 25  GLUCOSE 162* 122*  BUN  14 13  CREATININE 0.99 0.84  CALCIUM 9.2 9.0  MG  --  1.9  PHOS  --  3.1   Liver Function Tests: Recent Labs  Lab 10/15/17 0517  AST 38  ALT 32  ALKPHOS 65  BILITOT 0.6  PROT 7.0  ALBUMIN 3.5   No results for input(s): LIPASE, AMYLASE in the last 168 hours. No results for input(s): AMMONIA in the last 168 hours. CBC: Recent Labs  Lab 10/14/17 2057 10/15/17 0517  WBC 9.1 9.2  HGB 12.1 11.9*  HCT 38.3 36.9  MCV 84.0 83.1  PLT 262 247   Cardiac Enzymes: Recent Labs  Lab 10/14/17 2057 10/15/17 0123 10/15/17 0734  TROPONINI <0.03 0.16* 0.23*   BNP: Invalid input(s): POCBNP CBG: No results for input(s): GLUCAP in the last 168 hours. D-Dimer No results for input(s): DDIMER in the last 72 hours. Hgb A1c No results for input(s): HGBA1C in the last 72 hours. Lipid Profile No results for input(s): CHOL, HDL, LDLCALC, TRIG, CHOLHDL, LDLDIRECT in the last 72 hours. Thyroid function studies Recent Labs    10/15/17 0517  TSH 0.452   Anemia work  up No results for input(s): VITAMINB12, FOLATE, FERRITIN, TIBC, IRON, RETICCTPCT in the last 72 hours. Urinalysis No results found for: COLORURINE, APPEARANCEUR, LABSPEC, PHURINE, GLUCOSEU, HGBUR, BILIRUBINUR, KETONESUR, PROTEINUR, UROBILINOGEN, NITRITE, LEUKOCYTESUR Sepsis Labs Invalid input(s): PROCALCITONIN,  WBC,  LACTICIDVEN Microbiology No results found for this or any previous visit (from the past 240 hour(s)).   Time coordinating discharge in minutes: 35  SIGNED:   Calvert Cantor, MD  [facility] 10/15/2017, 2:36 PM Pager   If 7PM-7AM, please contact night-coverage [email or web address] Password [email or web address]

## 2017-10-15 NOTE — ED Notes (Signed)
Reported called to [facility] in ICU. Advised that pt will go to [facility] for cardiac CT procedure and upon return, [facility] will bring patient to ICU.

## 2017-10-15 NOTE — ED Notes (Signed)
Doutova MD at bedside. 

## 2017-10-15 NOTE — H&P (Signed)
Mary Poole NMM:[id number] DOB: January 31, 1951 DOA: 10/14/2017     PCP: Everrett Coombe, DO   Outpatient Specialists:   CARDS:  Dr. Dr. Mayford Knife, Verdis Prime   GI Dr. Elnoria Howard  Patient arrived to ER on 10/14/17 at 2037  Patient coming from: home Lives  With family    Chief Complaint:  Chief Complaint  Patient presents with  . Palpitations    HPI: Mary Poole is a 67 y.o. female with medical history significant of a.flutter sp cardioversion now on Flecainide and anticoagulation,GERD, HTN, OSA,GERD history of blood loss anemia in the past    Presented with palpitations and left arm pain.  Similar to prior presentations Up to an hour of left arm heaviness usually she gets chest pain shortness of breath associated with this but not this time.  She have not felt lightheaded or dizzy. HR up to 190 - 210 initially now on diltiazem drip HR 110 On August 10 th she was admitted with a wide-complex tachycardia thought to be A.fib w RVR post 2 failed cardioversions in ER while on diltiazem drip converted to sinus bradycardia.  Allergy was consulted Coreg 12.5 restarted twice daily echogram showed normal EF mild pulmonary hypertension Dr. Mayford Knife recommended to start flecainide 50 mg twice daily and follow-up with cardiology patient is already on Xarelto. Prior to this patient used to be taking [place] and she was told to discontinue. 28 August patient has been seen by cardiology.  Neurology felt that she has short PR interval with accessory pathway but no obvious delta wave with rapid AV conduction plan was if she has recurrence she may need ablation Her carvedilol was increased to 25 mg and flecainide increased to 100 mg  Regarding pertinent Chronic problems:  History of sleep apnea followed by cardiology Dr. Mayford Knife ordered CPAP a .flutter/fib with one-to-one AV conduction in the past heart rate has been as high as 220 CHADS VASC score is 3. On Coreg flecainide and Xarelto    While in  ER:  The following Work up has been ordered so far:  Orders Placed This Encounter  Procedures  . Critical Care  . DG Chest Portable 1 View  . Basic metabolic panel  . CBC  . Troponin I  . Troponin I  . Cardiac monitoring  . Saline Lock IV, Maintain IV access  . If O2 Sat <94% administer O2 at 2 liters/minute via nasal cannula  . Consult to hospitalist  . Consult to hospitalist  . Pulse oximetry, continuous  . EKG 12-Lead  . ED EKG within 10 minutes  . EKG 12-Lead      Following Medications were ordered in ER: Medications  diltiazem (CARDIZEM) 1 mg/mL load via infusion 20 mg (20 mg Intravenous Bolus from Bag 10/14/17 2152)    And  diltiazem (CARDIZEM) 100 mg in dextrose 5% (1 mg/mL) infusion (10 mg/hr Intravenous Rate/Dose Change 10/14/17 2240)  sodium chloride 0.9 % bolus 1,000 mL (0 mLs Intravenous Stopped 10/14/17 2237)  diltiazem (CARDIZEM) 1 mg/mL load via infusion 10 mg (10 mg Intravenous Bolus from Bag 10/14/17 2237)    Significant initial  Findings: Abnormal Labs Reviewed  BASIC METABOLIC PANEL - Abnormal; Notable for the following components:      Result Value   Glucose, Bld 162 (*)    GFR calc non Af Amer 58 (*)    All other components within normal limits  CBC - Abnormal; Notable for the following components:   RDW 16.4 (*)  All other components within normal limits     Na 143 K 3.7  Cr    stable,    Lab Results  Component Value Date   CREATININE 0.99 10/14/2017   CREATININE 0.93 09/18/2017   CREATININE 0.95 09/17/2017      WBC  9.1  HG/HCT   stable,      Component Value Date/Time   HGB 12.1 10/14/2017 2057   HCT 38.3 10/14/2017 2057       Troponin <0.03       UA  not ordered    CXR - Interval cardiomegaly and mildly progressive pulmonary vascular congestion   but comparing to view versus portable  ECG:  Personally reviewed by me showing: HR : Heart rate 166 Rhythm:     A.fib. W RVR,   nonspecific changes,  QTC 409      ED Triage Vitals  Enc Vitals Group     BP 10/14/17 2042 (!) 140/109     Pulse Rate 10/14/17 2042 (!) 204     Resp 10/14/17 2042 18     Temp 10/14/17 2042 98 F (36.7 C)     Temp src --      SpO2 10/14/17 2042 97 %     Weight --      Height --      Head Circumference --      Peak Flow --      Pain Score 10/14/17 2049 5     Pain Loc --      Pain Edu? --      Excl. in GC? --   TMAX(24)@       Latest  Blood pressure 102/80, pulse (!) 116, temperature 98 F (36.7 C), resp. rate 17, SpO2 100 %.    Hospitalist was called for admission for a.fib with  RVR   Review of Systems:    Pertinent positives include: fatigue, palpitations.  Left arm pain  Constitutional:  No weight loss, night sweats, Fevers, chills,  weight loss  HEENT:  No headaches, Difficulty swallowing,Tooth/dental problems,Sore throat,  No sneezing, itching, ear ache, nasal congestion, post nasal drip,  Cardio-vascular:  No chest pain, Orthopnea, PND, anasarca, dizziness, no Bilateral lower extremity swelling  GI:  No heartburn, indigestion, abdominal pain, nausea, vomiting, diarrhea, change in bowel habits, loss of appetite, melena, blood in stool, hematemesis Resp:  no shortness of breath at rest. No dyspnea on exertion, No excess mucus, no productive cough, No non-productive cough, No coughing up of blood.No change in color of mucus.No wheezing. Skin:  no rash or lesions. No jaundice GU:  no dysuria, change in color of urine, no urgency or frequency. No straining to urinate.  No flank pain.  Musculoskeletal:    or no joint swelling. No decreased range of motion. No back pain.  Psych:  No change in mood or affect. No depression or anxiety. No memory loss.  Neuro: no localizing neurological complaints, no tingling, no weakness, no double vision, no gait abnormality, no slurred speech, no confusion  All systems reviewed and apart from HOPI all are negative  Past Medical History:   Past Medical History:   Diagnosis Date  . GERD (gastroesophageal reflux disease)   . H/O atrial flutter   . Hypertension   . Migraine   . Sleep apnea       Past Surgical History:  Procedure Laterality Date  . COLONOSCOPY N/A 02/24/2017   Procedure: COLONOSCOPY;  Surgeon: Jeani Hawking, MD;  Location: [facility] ENDOSCOPY;  Service:  Gastroenterology;  Laterality: N/A;  . ENTEROSCOPY N/A 02/24/2017   Procedure: ENTEROSCOPY;  Surgeon: Jeani Hawking, MD;  Location: [facility] ENDOSCOPY;  Service: Gastroenterology;  Laterality: N/A;  . HERNIA REPAIR    . WISDOM TOOTH EXTRACTION      Social History:  Ambulatory   independently       reports that she has quit smoking. She has never used smokeless tobacco. She reports that she does not drink alcohol or use drugs.     Family History:   Family History  Problem Relation Age of Onset  . CAD Father   . AAA (abdominal aortic aneurysm) Sister   . Thyroid disease Sister     Allergies: Allergies  Allergen Reactions  . Diphenhydramine Hcl Other (See Comments)     Prior to Admission medications   Medication Sig Start Date End Date Taking? Authorizing Provider  carvedilol (COREG) 25 MG tablet Take 1 tablet (25 mg total) by mouth 2 (two) times daily. 10/05/17 01/03/18 Yes Camnitz, Andree Coss, MD  flecainide (TAMBOCOR) 100 MG tablet Take 1 tablet (100 mg total) by mouth 2 (two) times daily. 10/05/17  Yes Camnitz, Will Daphine Deutscher, MD  omeprazole (PRILOSEC) 20 MG capsule Take 1 capsule (20 mg total) by mouth daily. 07/28/17  Yes Everrett Coombe, DO  XARELTO 20 MG TABS tablet TAKE 1 TABLET BY MOUTH EVERY DAY 06/20/17  Yes Lyn Records, MD   Physical Exam: Blood pressure 102/80, pulse (!) 116, temperature 98 F (36.7 C), resp. rate 17, SpO2 100 %. 1. General:  in No Acute distress  well  -appearing 2. Psychological: Alert and Oriented 3. Head/ENT:     Dry Mucous Membranes                          Head Non traumatic, neck supple                            Poor Dentition 4. SKIN:    decreased Skin turgor,  Skin clean Dry and intact no rash 5. Heart: Regular rate and rhythm no  Murmur, no Rub or gallop 6. Lungs: no wheezes or crackles   7. Abdomen: Soft,  non-tender, Non distended  obese   8. Lower extremities: no clubbing, cyanosis, or  edema 9. Neurologically Grossly intact, moving all 4 extremities equally   10. MSK: Normal range of motion  LABS:     Recent Labs  Lab 10/14/17 2057  WBC 9.1  HGB 12.1  HCT 38.3  MCV 84.0  PLT 262   Basic Metabolic Panel: Recent Labs  Lab 10/14/17 2057  NA 143  K 3.7  CL 107  CO2 24  GLUCOSE 162*  BUN 14  CREATININE 0.99  CALCIUM 9.2      No results for input(s): AST, ALT, ALKPHOS, BILITOT, PROT, ALBUMIN in the last 168 hours. No results for input(s): LIPASE, AMYLASE in the last 168 hours. No results for input(s): AMMONIA in the last 168 hours.    HbA1C: No results for input(s): HGBA1C in the last 72 hours. CBG: No results for input(s): GLUCAP in the last 168 hours.    Urine analysis: No results found for: COLORURINE, APPEARANCEUR, LABSPEC, PHURINE, GLUCOSEU, HGBUR, BILIRUBINUR, KETONESUR, PROTEINUR, UROBILINOGEN, NITRITE, LEUKOCYTESUR     Cultures: No results found for: SDES, SPECREQUEST, CULT, REPTSTATUS   Radiological Exams on Admission: Dg Chest Portable 1 View  Result Date: 10/14/2017 CLINICAL DATA:  Chest  pain. Atrial fibrillation. EXAM: PORTABLE CHEST 1 VIEW COMPARISON:  09/17/2017. FINDINGS: Interval enlarged cardiac silhouette with a mild increase in prominence of the pulmonary vasculature. Clear lungs. No pleural fluid. Unremarkable bones. IMPRESSION: Interval cardiomegaly and mildly progressive pulmonary vascular congestion. Electronically Signed   By: Beckie Salts M.D.   On: 10/14/2017 21:42    Chart has been reviewed    Assessment/Plan  67 y.o. female with medical history significant of a.flutter/fib  on anticoagulation and flecainide, GERD, HTN, OSA,GERD   Admitted  to manage A.  Fib/Flutter with RVR on diltiazem drip  Present on Admission: . Atrial fibrillation/Flutter with RVR (HCC) -for now continue diltiazem drip Coreg p.o. twice daily  and flecainide heart rate better controlled down 110's.  From 180s will likely benefit from ablation.  Continue anticoagulation with Xarelto Cycle cardiac enzymes given initial chest pressure/left arm pressure currently resolved Chest x-ray showing cardiomegaly patient have had a recent echogram which was unremarkable.  Suspect chest x-ray changes secondary to difference in view.    . Essential hypertension stable continue Coreg . GERD (gastroesophageal reflux disease) stable continue Protonix . Thyroid disease stable continue home medications . Accessory atrioventricular pathway: Cardiology reviewed EKG feel like it could be possibly slow V flatter rather than short PR interval sinus tachycardia given variability.  Will need further cardiology work-up and  follow-up     Other plan as per orders.  DVT prophylaxis:  Xarelto       Code Status:  FULL CODE  as per patient     Family Communication:   Family not  at  Bedside   Disposition Plan  To home once workup is complete and patient is stable                                          Consults called: Discussed with cardiology will see in AM  Admission status:  Obs    Level of care          SDU      Oran Dillenburg 10/15/2017, 1:05 AM    [facility]  Pager [phone]   after 2 AM please page floor coverage PA If 7AM-7PM, please contact the day team taking care of the patient  [email or web address]  Password [email or web address]

## 2017-10-15 NOTE — ED Notes (Addendum)
Pt reports continued urination and diarrhea.  She denies cp or dizziness.  She uses the bedside commode without difficulty.

## 2017-10-17 ENCOUNTER — Telehealth (HOSPITAL_COMMUNITY): Payer: Self-pay | Admitting: *Deleted

## 2017-10-17 ENCOUNTER — Telehealth: Payer: Self-pay | Admitting: Cardiology

## 2017-10-17 NOTE — Telephone Encounter (Signed)
° ° °  Patient calling to discuss ablation

## 2017-10-17 NOTE — Telephone Encounter (Signed)
LMOM for pt to clbk to sched follow up appt.  Seen in ED with afib RVR, converted to aflutter

## 2017-10-18 NOTE — Telephone Encounter (Signed)
Pt cld back, refused appt.  Pt stated she will follow up with her cardiologist

## 2017-10-18 NOTE — Telephone Encounter (Addendum)
Pt informs me that she was in the hospital last week with flutter issues.  States that Dr. Delton See recommended she be seen sooner to discuss w/ Dr. [name]. Pt scheduled to follow up on Monday w/ WC to discuss further w/ possible ablation consideration

## 2017-10-20 DIAGNOSIS — R293 Abnormal posture: Secondary | ICD-10-CM | POA: Diagnosis not present

## 2017-10-20 DIAGNOSIS — M2560 Stiffness of unspecified joint, not elsewhere classified: Secondary | ICD-10-CM | POA: Diagnosis not present

## 2017-10-20 DIAGNOSIS — M545 Low back pain: Secondary | ICD-10-CM | POA: Diagnosis not present

## 2017-10-20 DIAGNOSIS — M9903 Segmental and somatic dysfunction of lumbar region: Secondary | ICD-10-CM | POA: Diagnosis not present

## 2017-10-24 ENCOUNTER — Ambulatory Visit (INDEPENDENT_AMBULATORY_CARE_PROVIDER_SITE_OTHER): Payer: Medicare Other | Admitting: Cardiology

## 2017-10-24 ENCOUNTER — Encounter: Payer: Self-pay | Admitting: Cardiology

## 2017-10-24 VITALS — BP 124/72 | HR 65 | Ht 65.0 in | Wt 211.0 lb

## 2017-10-24 DIAGNOSIS — I10 Essential (primary) hypertension: Secondary | ICD-10-CM

## 2017-10-24 DIAGNOSIS — G4733 Obstructive sleep apnea (adult) (pediatric): Secondary | ICD-10-CM

## 2017-10-24 DIAGNOSIS — R9431 Abnormal electrocardiogram [ECG] [EKG]: Secondary | ICD-10-CM | POA: Diagnosis not present

## 2017-10-24 DIAGNOSIS — I483 Typical atrial flutter: Secondary | ICD-10-CM

## 2017-10-24 NOTE — Progress Notes (Signed)
Electrophysiology Office Note   Date:  10/24/2017   ID:  Mary Poole, DOB Mar 11, 1950, MRN [id number]  PCP:  Everrett Coombe, DO  Cardiologist:  Katrinka Blazing Primary Electrophysiologist:  Jayd Cadieux Jorja Loa, MD    No chief complaint on file.    History of Present Illness: Mary Poole is a 67 y.o. female who is being seen today for the evaluation of atrial flutter at the request of Everrett Coombe, DO. Presenting today for electrophysiology evaluation. She has a history of hypertension and palpitations. She was diagnosed with atrial fibrillation on 07/18/16. She had 2 episodes of chest pressure with radiation to her arm in late May or early June. It lasted 20 minutes. EMS was called but she felt better. 10 days later, she had similar symptoms.  Went to the emergency room where she had a wide-complex tachycardia.  While in the emergency room, her rate slowed and showed a likely atrial flutter.  It was thought that this was the same rhythm with the wide-complex tachycardia being apparent 1-1 conduction.  She reverted to sinus rhythm with IV medications.     Today, denies symptoms of palpitations, chest pain, shortness of breath, orthopnea, PND, lower extremity edema, claudication, dizziness, presyncope, syncope, bleeding, or neurologic sequela. The patient is tolerating medications without difficulties.  Overall she is feeling well.  She has had multiple ER visits recently with rapid atrial flutter that have converted with diltiazem.  Her main complaints are weakness, fatigue, and shortness of breath when she is in atrial flutter.   Past Medical History:  Diagnosis Date  . GERD (gastroesophageal reflux disease)   . H/O atrial flutter   . Hypertension   . Migraine   . Sleep apnea    Past Surgical History:  Procedure Laterality Date  . COLONOSCOPY N/A 02/24/2017   Procedure: COLONOSCOPY;  Surgeon: Jeani Hawking, MD;  Location: [facility] ENDOSCOPY;  Service: Gastroenterology;  Laterality: N/A;  .  ENTEROSCOPY N/A 02/24/2017   Procedure: ENTEROSCOPY;  Surgeon: Jeani Hawking, MD;  Location: [facility] ENDOSCOPY;  Service: Gastroenterology;  Laterality: N/A;  . HERNIA REPAIR    . WISDOM TOOTH EXTRACTION       Current Outpatient Medications  Medication Sig Dispense Refill  . carvedilol (COREG) 25 MG tablet Take 1 tablet (25 mg total) by mouth 2 (two) times daily. 180 tablet 3  . flecainide (TAMBOCOR) 100 MG tablet Take 1 tablet (100 mg total) by mouth 2 (two) times daily. 180 tablet 3  . omeprazole (PRILOSEC) 20 MG capsule Take 1 capsule (20 mg total) by mouth daily. 90 capsule 2  . XARELTO 20 MG TABS tablet TAKE 1 TABLET BY MOUTH EVERY DAY 90 tablet 2   No current facility-administered medications for this visit.     Allergies:   Diphenhydramine hcl   Social History:  The patient  reports that she has quit smoking. She has never used smokeless tobacco. She reports that she does not drink alcohol or use drugs.   Family History:  The patient's family history includes AAA (abdominal aortic aneurysm) in her sister; CAD in her father; Thyroid disease in her sister.   ROS:  Please see the history of present illness.   Otherwise, review of systems is positive for chest pressure, palpitations, snoring, wheezing, rash.   All other systems are reviewed and negative.   PHYSICAL EXAM: VS:  BP 124/72   Pulse 65   Ht 5\' 5"  (1.651 m)   Wt 211 lb (95.7 kg)   SpO2 98%  BMI 35.11 kg/m  , BMI Body mass index is 35.11 kg/m. GEN: Well nourished, well developed, in no acute distress  HEENT: normal  Neck: no JVD, carotid bruits, or masses Cardiac: RRR; no murmurs, rubs, or gallops,no edema  Respiratory:  clear to auscultation bilaterally, normal work of breathing GI: soft, nontender, nondistended, + BS MS: no deformity or atrophy  Skin: warm and dry Neuro:  Strength and sensation are intact Psych: euthymic mood, full affect  EKG:  EKG is not ordered today. Personal review of the ekg ordered 10/15/17  shows atrial flutter, rate 95  Recent Labs: 10/15/2017: ALT 32; BUN 13; Creatinine, Ser 0.84; Hemoglobin 11.9; Magnesium 1.9; Platelets 247; Potassium 3.9; Sodium 142; TSH 0.452    Lipid Panel  No results found for: CHOL, TRIG, HDL, CHOLHDL, VLDL, LDLCALC, LDLDIRECT   Wt Readings from Last 3 Encounters:  10/24/17 211 lb (95.7 kg)  10/05/17 215 lb 9.6 oz (97.8 kg)  09/17/17 215 lb (97.5 kg)      Other studies Reviewed: Additional studies/ records that were reviewed today include: Myoview 10/06/16  Review of the above records today demonstrates:   Nuclear stress EF: 58%. The left ventricular ejection fraction is normal (55-65%).  The study is normal. There is breast attenuation. No ischemia . no evidence of infarction  This is a low risk study.  Holter 10/09/16 - personally reviewed  Basic rhythm is NSR rare PAC's and PVC's.  Brief SVT < 10 beats.  Heart rate range 43-113 bpm with average 63 bpm.   NSR Normal study  TTE 07/19/16 LEFT VENTRICLE The left ventricle is borderline dilated. There is normal left ventricular  wall thickness. Left ventricular systolic function is normal. LV ejection  fraction = 50-55%. Left ventricular filling pattern is indeterminate. No  segmental wall motion abnormalities seen in the left ventricle. LEFT ATRIUM The left atrium is mildly dilated. AORTIC VALVE The aortic valve is not well visualized. There is no aortic regurgitation.    ASSESSMENT AND PLAN:  1.  Atrial flutter with rapid ventricular response: He on Xarelto, carvedilol, flecainide.  This is not controlling her symptoms.  She would like to have ablation.  Risks and benefits were discussed and include bleeding, tamponade, heart block, stroke.  Patient understands these risks and is agreed to the procedure.  This patients CHA2DS2-VASc Score and unadjusted Ischemic Stroke Rate (% per year) is equal to 3.2 % stroke rate/year from a score of 3  Above score calculated as 1 point  each if present [CHF, HTN, DM, Vascular=MI/PAD/Aortic Plaque, Age if 65-74, or Female] Above score calculated as 2 points each if present [Age > 75, or Stroke/TIA/TE]   2. Short PR interval: Potentially an accessory pathway versus rapid AV conduction.  She does not have a delta wave.  Plan for a full EP study.  3. Hypertension: Well-controlled.  No changes.  4. OSA: CPAP compliance encouraged    Current medicines are reviewed at length with the patient today.   The patient does not have concerns regarding her medicines.  The following changes were made today: None  Labs/ tests ordered today include:  No orders of the defined types were placed in this encounter.    Disposition:   FU with Asmara Backs 3 months  Signed, Meztli Llanas Jorja LoaMartin Yobany Vroom, MD  10/24/2017 3:16 PM     [facility] [address] [place] [address] [phone] (office) [phone] (fax)

## 2017-10-24 NOTE — Patient Instructions (Addendum)
Medication Instructions:  Your physician recommends that you continue on your current medications as directed. Please refer to the Current Medication list given to you today.  *If you need a refill on your cardiac medications before your next appointment, please call your pharmacy.  Labwork: Pre procedure lab work to be determined once ablation date is scheduled.  Testing/Procedures: Your physician has recommended that you have an ablation. Catheter ablation is a medical procedure used to treat some cardiac arrhythmias (irregular heartbeats). During catheter ablation, a long, thin, flexible tube is put into a blood vessel in your groin (upper thigh), or neck. This tube is called an ablation catheter. It is then guided to your heart through the blood vessel. Radio frequency waves destroy small areas of heart tissue where abnormal heartbeats may cause an arrhythmia to start. Please see the instructions below located under special instructions  The following dates are available (these are subject to change): 10/2, 10/10, 10/17,  10/23, 10/25   Please call Dory HornSherri Dwan Fennel, RN when you are ready to schedule.  Or you may send a  message via MyChart.  Follow-Up: Your physician recommends that you schedule a follow-up appointment in: 4 weeks, after your procedure on ______, with Dr. Elberta Fortisamnitz.  *Please note that any paperwork needing to be filled out by the provider will need to be addressed at the front desk prior to seeing the provider. Please note that any FMLA, disability or other documents regarding health condition is subject to a $25.00 charge that must be received prior to completion of paperwork in the form of a money order or check.  Thank you for choosing [facility]!! Dory HornSherri Rishabh Rinkenberger, RN [phone]   Any Other Special Instructions Will Be Listed Below   Instructions for your ablation: 1. Please arrive at the [facility], Main Entrance "A", of [facility] at _____  on ______. 2.  Do not eat or drink after midnight the night prior to the procedure. 3. Do not miss any doses of XARELTO prior to the morning of the procedure.  4. Do not take any medications the morning of the procedure. 5. Both of your groins will need to be shaved for this procedure (if needed). We ask that you do this yourself at home 1-2 days prior to the procedure.  If you are unable/uncomfortable to do yourself, the hospital staff will shave you the day of your procedure (if needed). 6. Plan for an overnight stay in the hospital. 7. You will need someone to drive you home at discharge.    Cardiac Ablation Cardiac ablation is a procedure to disable (ablate) a small amount of heart tissue in very specific places. The heart has many electrical connections. Sometimes these connections are abnormal and can cause the heart to beat very fast or irregularly. Ablating some of the problem areas can improve the heart rhythm or return it to normal. Ablation may be done for people who:  Have Wolff-Parkinson-White syndrome.  Have fast heart rhythms (tachycardia).  Have taken medicines for an abnormal heart rhythm (arrhythmia) that were not effective or caused side effects.  Have a high-risk heartbeat that may be life-threatening.  During the procedure, a small incision is made in the neck or the groin, and a long, thin, flexible tube (catheter) is inserted into the incision and moved to the heart. Small devices (electrodes) on the tip of the catheter will send out electrical currents. A type of X-ray (fluoroscopy) will be used to help guide the catheter and  to provide images of the heart. Tell a health care provider about:  Any allergies you have.  All medicines you are taking, including vitamins, herbs, eye drops, creams, and over-the-counter medicines.  Any problems you or family members have had with anesthetic medicines.  Any blood disorders you have.  Any surgeries you have had.  Any medical  conditions you have, such as kidney failure.  Whether you are pregnant or may be pregnant. What are the risks? Generally, this is a safe procedure. However, problems may occur, including:  Infection.  Bruising and bleeding at the catheter insertion site.  Bleeding into the chest, especially into the sac that surrounds the heart. This is a serious complication.  Stroke or blood clots.  Damage to other structures or organs.  Allergic reaction to medicines or dyes.  Need for a permanent pacemaker if the normal electrical system is damaged. A pacemaker is a small computer that sends electrical signals to the heart and helps your heart beat normally.  The procedure not being fully effective. This may not be recognized until months later. Repeat ablation procedures are sometimes required.  What happens before the procedure?  Follow instructions from your health care provider about eating or drinking restrictions.  Ask your health care provider about: ? Changing or stopping your regular medicines. This is especially important if you are taking diabetes medicines or blood thinners. ? Taking medicines such as aspirin and ibuprofen. These medicines can thin your blood. Do not take these medicines before your procedure if your health care provider instructs you not to.  Plan to have someone take you home from the hospital or clinic.  If you will be going home right after the procedure, plan to have someone with you for 24 hours. What happens during the procedure?  To lower your risk of infection: ? Your health care team will wash or sanitize their hands. ? Your skin will be washed with soap. ? Hair may be removed from the incision area.  An IV tube will be inserted into one of your veins.  You will be given a medicine to help you relax (sedative).  The skin on your neck or groin will be numbed.  An incision will be made in your neck or your groin.  A needle will be inserted  through the incision and into a large vein in your neck or groin.  A catheter will be inserted into the needle and moved to your heart.  Dye may be injected through the catheter to help your surgeon see the area of the heart that needs treatment.  Electrical currents will be sent from the catheter to ablate heart tissue in desired areas. There are three types of energy that may be used to ablate heart tissue: ? Heat (radiofrequency energy). ? Laser energy. ? Extreme cold (cryoablation).  When the necessary tissue has been ablated, the catheter will be removed.  Pressure will be held on the catheter insertion area to prevent excessive bleeding.  A bandage (dressing) will be placed over the catheter insertion area. The procedure may vary among health care providers and hospitals. What happens after the procedure?  Your blood pressure, heart rate, breathing rate, and blood oxygen level will be monitored until the medicines you were given have worn off.  Your catheter insertion area will be monitored for bleeding. You will need to lie still for a few hours to ensure that you do not bleed from the catheter insertion area.  Do not drive for  24 hours or as long as directed by your health care provider. Summary  Cardiac ablation is a procedure to disable (ablate) a small amount of heart tissue in very specific places. Ablating some of the problem areas can improve the heart rhythm or return it to normal.  During the procedure, electrical currents will be sent from the catheter to ablate heart tissue in desired areas. This information is not intended to replace advice given to you by your health care provider. Make sure you discuss any questions you have with your health care provider. Document Released: 06/13/2008 Document Revised: 12/15/2015 Document Reviewed: 12/15/2015 Elsevier Interactive Patient Education  [employer].

## 2017-10-26 ENCOUNTER — Telehealth: Payer: Self-pay | Admitting: Cardiology

## 2017-10-26 NOTE — Telephone Encounter (Signed)
Left detailed message informing pt I would hold 10/25 spot for ablation. Asked pt to let me know that she still wants to proceed w/ the 25th.

## 2017-10-26 NOTE — Telephone Encounter (Signed)
New Message   Pt states she is calling to check if October 25 is still available for her to have the ablation. Please call

## 2017-10-30 ENCOUNTER — Emergency Department (HOSPITAL_COMMUNITY): Payer: Medicare Other

## 2017-10-30 ENCOUNTER — Observation Stay (HOSPITAL_COMMUNITY)
Admission: EM | Admit: 2017-10-30 | Discharge: 2017-10-31 | Disposition: A | Discharge status: Home / Self Care | Payer: Medicare Other | Attending: Family Medicine | Admitting: Family Medicine

## 2017-10-30 ENCOUNTER — Encounter (HOSPITAL_COMMUNITY): Payer: Self-pay | Admitting: Nurse Practitioner

## 2017-10-30 DIAGNOSIS — Z87891 Personal history of nicotine dependence: Secondary | ICD-10-CM | POA: Diagnosis not present

## 2017-10-30 DIAGNOSIS — R000 Tachycardia, unspecified: Secondary | ICD-10-CM | POA: Diagnosis present

## 2017-10-30 DIAGNOSIS — I4891 Unspecified atrial fibrillation: Secondary | ICD-10-CM | POA: Diagnosis not present

## 2017-10-30 DIAGNOSIS — I10 Essential (primary) hypertension: Secondary | ICD-10-CM | POA: Diagnosis not present

## 2017-10-30 DIAGNOSIS — I480 Paroxysmal atrial fibrillation: Secondary | ICD-10-CM | POA: Diagnosis not present

## 2017-10-30 DIAGNOSIS — R002 Palpitations: Secondary | ICD-10-CM | POA: Diagnosis not present

## 2017-10-30 DIAGNOSIS — K219 Gastro-esophageal reflux disease without esophagitis: Secondary | ICD-10-CM | POA: Diagnosis not present

## 2017-10-30 DIAGNOSIS — Z7901 Long term (current) use of anticoagulants: Secondary | ICD-10-CM | POA: Diagnosis not present

## 2017-10-30 DIAGNOSIS — Z79899 Other long term (current) drug therapy: Secondary | ICD-10-CM | POA: Diagnosis not present

## 2017-10-30 DIAGNOSIS — R079 Chest pain, unspecified: Secondary | ICD-10-CM

## 2017-10-30 DIAGNOSIS — G4733 Obstructive sleep apnea (adult) (pediatric): Secondary | ICD-10-CM | POA: Diagnosis not present

## 2017-10-30 DIAGNOSIS — R2681 Unsteadiness on feet: Secondary | ICD-10-CM | POA: Insufficient documentation

## 2017-10-30 LAB — COMPREHENSIVE METABOLIC PANEL
ALBUMIN: 3.8 g/dL (ref 3.5–5.0)
ALK PHOS: 62 U/L (ref 38–126)
ALT: 23 U/L (ref 0–44)
ANION GAP: 10 (ref 5–15)
AST: 27 U/L (ref 15–41)
BILIRUBIN TOTAL: 0.7 mg/dL (ref 0.3–1.2)
BUN: 17 mg/dL (ref 8–23)
CALCIUM: 9.6 mg/dL (ref 8.9–10.3)
CO2: 28 mmol/L (ref 22–32)
Chloride: 107 mmol/L (ref 98–111)
Creatinine, Ser: 1.07 mg/dL — ABNORMAL HIGH (ref 0.44–1.00)
GFR calc Af Amer: >60 mL/min (ref >60)
GFR calc non Af Amer: 52 mL/min — ABNORMAL LOW (ref >60)
GLUCOSE: 94 mg/dL (ref 70–99)
Potassium: 3.6 mmol/L (ref 3.5–5.1)
SODIUM: 145 mmol/L (ref 135–145)
TOTAL PROTEIN: 7.5 g/dL (ref 6.5–8.1)

## 2017-10-30 LAB — CBC WITH DIFFERENTIAL/PLATELET
Basophils Absolute: 0 10*3/uL (ref 0.0–0.1)
Basophils Relative: 0 %
EOS ABS: 0.1 10*3/uL (ref 0.0–0.7)
Eosinophils Relative: 1 %
HEMATOCRIT: 41.6 % (ref 36.0–46.0)
HEMOGLOBIN: 13.3 g/dL (ref 12.0–15.0)
Lymphocytes Relative: 20 %
Lymphs Abs: 1.9 10*3/uL (ref 0.7–4.0)
MCH: 26.9 pg (ref 26.0–34.0)
MCHC: 32 g/dL (ref 30.0–36.0)
MCV: 84 fL (ref 78.0–100.0)
MONOS PCT: 8 %
Monocytes Absolute: 0.7 10*3/uL (ref 0.1–1.0)
NEUTROS ABS: 6.8 10*3/uL (ref 1.7–7.7)
Neutrophils Relative %: 71 %
Platelets: 284 10*3/uL (ref 150–400)
RBC: 4.95 MIL/uL (ref 3.87–5.11)
RDW: 16.2 % — ABNORMAL HIGH (ref 11.5–15.5)
WBC: 9.5 10*3/uL (ref 4.0–10.5)

## 2017-10-30 LAB — PROTIME-INR
INR: 1.14
Prothrombin Time: 14.5 seconds (ref 11.4–15.2)

## 2017-10-30 LAB — I-STAT TROPONIN, ED: TROPONIN I, POC: 0 ng/mL (ref 0.00–0.08)

## 2017-10-30 MED ORDER — LIP MEDEX EX OINT
TOPICAL_OINTMENT | CUTANEOUS | Status: DC | PRN
  Filled 2017-10-30: qty 7

## 2017-10-30 MED ORDER — SENNOSIDES-DOCUSATE SODIUM 8.6-50 MG PO TABS: 1.0000 | ORAL_TABLET | Freq: Every evening | ORAL | Status: DC | PRN

## 2017-10-30 MED ORDER — DILTIAZEM LOAD VIA INFUSION
10.0000 mg | Freq: Once | INTRAVENOUS | Status: AC
  Administered 2017-10-30: 10 mg via INTRAVENOUS
  Filled 2017-10-30: qty 10

## 2017-10-30 MED ORDER — ACETAMINOPHEN 325 MG PO TABS: 650.0000 mg | ORAL_TABLET | Freq: Four times a day (QID) | ORAL | Status: DC | PRN

## 2017-10-30 MED ORDER — ONDANSETRON HCL 4 MG PO TABS: 4.0000 mg | ORAL_TABLET | Freq: Four times a day (QID) | ORAL | Status: DC | PRN

## 2017-10-30 MED ORDER — PANTOPRAZOLE SODIUM 40 MG PO TBEC
40.0000 mg | DELAYED_RELEASE_TABLET | Freq: Every day | ORAL | Status: DC
  Administered 2017-10-31: 40 mg via ORAL
  Filled 2017-10-30: qty 1

## 2017-10-30 MED ORDER — ACETAMINOPHEN 650 MG RE SUPP: 650.0000 mg | Freq: Four times a day (QID) | RECTAL | Status: DC | PRN

## 2017-10-30 MED ORDER — ETOMIDATE 2 MG/ML IV SOLN: 10.0000 mg | Freq: Once | INTRAVENOUS | Status: DC

## 2017-10-30 MED ORDER — IPRATROPIUM-ALBUTEROL 0.5-2.5 (3) MG/3ML IN SOLN
3.0000 mL | Freq: Four times a day (QID) | RESPIRATORY_TRACT | Status: DC
  Filled 2017-10-30 (×2): qty 3

## 2017-10-30 MED ORDER — SODIUM CHLORIDE 0.9 % IV SOLN
INTRAVENOUS | Status: DC
  Administered 2017-10-30: 18:00:00 via INTRAVENOUS

## 2017-10-30 MED ORDER — RIVAROXABAN 20 MG PO TABS
20.0000 mg | ORAL_TABLET | Freq: Every day | ORAL | Status: DC
  Administered 2017-10-30 – 2017-10-31 (×2): 20 mg via ORAL
  Filled 2017-10-30 (×2): qty 1

## 2017-10-30 MED ORDER — CARVEDILOL 25 MG PO TABS
25.0000 mg | ORAL_TABLET | Freq: Two times a day (BID) | ORAL | Status: DC
  Administered 2017-10-31: 25 mg via ORAL
  Filled 2017-10-30: qty 1

## 2017-10-30 MED ORDER — FENTANYL CITRATE (PF) 100 MCG/2ML IJ SOLN
INTRAMUSCULAR | Status: AC
  Filled 2017-10-30: qty 2

## 2017-10-30 MED ORDER — DILTIAZEM HCL-DEXTROSE 100-5 MG/100ML-% IV SOLN (PREMIX)
5.0000 mg/h | INTRAVENOUS | Status: AC
  Administered 2017-10-30: 15 mg/h via INTRAVENOUS
  Administered 2017-10-30: 12.5 mg/h via INTRAVENOUS
  Administered 2017-10-30: 5 mg/h via INTRAVENOUS
  Filled 2017-10-30: qty 200
  Filled 2017-10-30: qty 100

## 2017-10-30 MED ORDER — FENTANYL CITRATE (PF) 100 MCG/2ML IJ SOLN: 25.0000 ug | Freq: Once | INTRAMUSCULAR | Status: DC

## 2017-10-30 MED ORDER — SODIUM CHLORIDE 0.9 % IV SOLN
INTRAVENOUS | Status: DC
  Administered 2017-10-30 – 2017-10-31 (×2): via INTRAVENOUS

## 2017-10-30 MED ORDER — OXYCODONE HCL 5 MG PO TABS
5.0000 mg | ORAL_TABLET | ORAL | Status: DC | PRN
  Administered 2017-10-31 (×2): 5 mg via ORAL
  Filled 2017-10-30 (×2): qty 1

## 2017-10-30 MED ORDER — ONDANSETRON HCL 4 MG/2ML IJ SOLN: 4.0000 mg | Freq: Four times a day (QID) | INTRAMUSCULAR | Status: DC | PRN

## 2017-10-30 MED ORDER — FLECAINIDE ACETATE 100 MG PO TABS
100.0000 mg | ORAL_TABLET | Freq: Two times a day (BID) | ORAL | Status: DC
  Administered 2017-10-30: 100 mg via ORAL
  Filled 2017-10-30 (×2): qty 1

## 2017-10-30 MED ORDER — MAGNESIUM CITRATE PO SOLN: 1.0000 | Freq: Once | ORAL | Status: DC | PRN

## 2017-10-30 MED ORDER — SODIUM CHLORIDE 0.9 % IV BOLUS
250.0000 mL | Freq: Once | INTRAVENOUS | Status: AC
  Administered 2017-10-30: 250 mL via INTRAVENOUS

## 2017-10-30 MED ORDER — BISACODYL 5 MG PO TBEC: 5.0000 mg | DELAYED_RELEASE_TABLET | Freq: Every day | ORAL | Status: DC | PRN

## 2017-10-30 NOTE — Consult Note (Signed)
[facility] Consult Note   Primary Physician:  Everrett CoombeMatthews, Cody Primary Cardiologist:   Verdis PrimeSmith, Henry Electrophysiologist:  Regan Lemmingamnitz, Will Martin  Reason for Consultation: Atrial fibrillation with rapid ventricular rate   HPI:    Mary Poole is a 67 year old female with a past medical history significant for paroxysmal atrial fibrillation, obstructive sleep apnea, hypertension, migraine headaches and gastroesophageal reflux disease who presents with complaints of palpitations. She was diagnosed with atrial fibrillation back in 2018. She had had off and on episodes of palpitations along with chest pressure. Earlier this month the patient presented with similar symptoms. She was rate controlled with intravenous Cardizem. She reverted back to sinus rhythm on her own. She was placed on flecainide to prevent future episodes. She was also on Xarelto for chronic anticoagulation.  The patient had been tentatively scheduled for an atrial fibrillation ablation on 12/02/2017. Unfortunately the patient has struggled to take the flecainide as prescribed. She developed a body rash after starting the medication. However she is still taking the flecainide every day. She denies any bleeding episodes on Xarelto.  She does not endorse any orthopnea, paroxysmal nocturnal dyspnea or lower extremity edema. . Today the patient did experience substernal chest pressure that radiated to the left arm when she had her palpitations. The ECG in the emergency department revealed left ventricular hypertrophy along with ST depressions in the inferolateral leads. Once the heart rate was better controlled patient's chest pain resolved.   Cardiac work-up Echocardiogram - 09/18/2017 Normal LV function; mild LVH; restrictive filling; moderate MR; severe LAE; mild TR with moderate pulmonary hypertension.  Myocardial perfusion scan - 10/06/2016 EF: 58%. The left ventricular ejection fraction is normal (55-65%). The study is  normal. There is breast attenuation. No ischemia . no evidence of infarction This is a low risk study.  Holter 10/09/16 Basic rhythm is NSR rare PAC's and PVC's. Brief SVT < 10 beats. Heart rate range 43-113 bpm with average 63 bpm.    Home Medications Prior to Admission medications   Medication Sig Start Date End Date Taking? Authorizing Provider  carvedilol (COREG) 25 MG tablet Take 1 tablet (25 mg total) by mouth 2 (two) times daily. 10/05/17 01/03/18  Camnitz, Andree CossWill Martin, MD  flecainide (TAMBOCOR) 100 MG tablet Take 1 tablet (100 mg total) by mouth 2 (two) times daily. 10/05/17   Camnitz, Andree CossWill Martin, MD  omeprazole (PRILOSEC) 20 MG capsule Take 1 capsule (20 mg total) by mouth daily. 07/28/17   Everrett CoombeMatthews, Cody, DO  XARELTO 20 MG TABS tablet TAKE 1 TABLET BY MOUTH EVERY DAY 06/20/17   Lyn RecordsSmith, Henry W, MD    Past Medical History: Past Medical History:  Diagnosis Date  . GERD (gastroesophageal reflux disease)   . H/O atrial flutter   . Hypertension   . Migraine   . Sleep apnea     Past Surgical History: Past Surgical History:  Procedure Laterality Date  . COLONOSCOPY N/A 02/24/2017   Procedure: COLONOSCOPY;  Surgeon: Jeani HawkingHung, Patrick, MD;  [facility];  Service: Gastroenterology;  Laterality: N/A;  . ENTEROSCOPY N/A 02/24/2017   Procedure: ENTEROSCOPY;  Surgeon: Jeani HawkingHung, Patrick, MD;  [facility];  Service: Gastroenterology;  Laterality: N/A;  . HERNIA REPAIR    . WISDOM TOOTH EXTRACTION      Family History: Family History  Problem Relation Age of Onset  . CAD Father   . AAA (abdominal aortic aneurysm) Sister   . Thyroid disease Sister     Social History: Social History  Socioeconomic History  . Marital status: Married    Spouse name: Not on file  . Number of children: Not on file  . Years of education: Not on file  . Highest education level: Not on file  Occupational History  . Not on file  Social Needs  . Financial resource strain: Not hard at  all  . Food insecurity:    Worry: Never true    Inability: Never true  . Transportation needs:    Medical: No    Non-medical: No  Tobacco Use  . Smoking status: Former Games developer  . Smokeless tobacco: Never Used  Substance and Sexual Activity  . Alcohol use: No  . Drug use: No  . Sexual activity: Not on file  Lifestyle  . Physical activity:    Days per week: Not on file    Minutes per session: Not on file  . Stress: Not on file  Relationships  . Social connections:    Talks on phone: Not on file    Gets together: Not on file    Attends religious service: Not on file    Active member of club or organization: Not on file    Attends meetings of clubs or organizations: Not on file    Relationship status: Not on file  Other Topics Concern  . Not on file  Social History Narrative  . Not on file    Allergies:  Allergies  Allergen Reactions  . Diphenhydramine Hcl Other (See Comments)     Review of Systems: [y] = yes, [ ]  = no   . General: Weight gain [ ] ; Weight loss [ ] ; Anorexia [ ] ; Fatigue Cove.Etienne ]; Fever [ ] ; Chills [ ] ; Weakness [ ]   . Cardiac: Chest pain/pressure [ ] ; Resting SOB [ ] ; Exertional SOB [ ] ; Orthopnea [ ] ; Pedal Edema [ ] ; Palpitations Cove.Etienne ]; Syncope [ ] ; Presyncope [ ] ; Paroxysmal nocturnal dyspnea[ ]   . Pulmonary: Cough [ ] ; Wheezing[ ] ; Hemoptysis[ ] ; Sputum [ ] ; Snoring [ ]   . GI: Vomiting[ ] ; Dysphagia[ ] ; Melena[ ] ; Hematochezia [ ] ; Heartburn[ ] ; Abdominal pain [ ] ; Constipation [ ] ; Diarrhea [ ] ; BRBPR [ ]   . GU: Hematuria[ ] ; Dysuria [ ] ; Nocturia[ ]   . Vascular: Pain in legs with walking [ ] ; Pain in feet with lying flat [ ] ; Non-healing sores [ ] ; Stroke [ ] ; TIA [ ] ; Slurred speech [ ] ;  . Neuro: Headaches[ ] ; Vertigo[ ] ; Seizures[ ] ; Paresthesias[ ] ;Blurred vision [ ] ; Diplopia [ ] ; Vision changes [ ]   . Ortho/Skin: Arthritis [ ] ; Joint pain [ ] ; Muscle pain [ ] ; Joint swelling [ ] ; Back Pain [ ] ; Rash [ ]   . Psych: Depression[ ] ; Anxiety[ ]   . Heme:  Bleeding problems [ ] ; Clotting disorders [ ] ; Anemia [ ]   . Endocrine: Diabetes [ ] ; Thyroid dysfunction[ ]      Objective:    Vital Signs:   Temp:  [98 F (36.7 C)-98.6 F (37 C)] 98.2 F (36.8 C) (09/22 2056) Pulse Rate:  [123-155] 135 (09/22 2056) Resp:  [14-38] 16 (09/22 2056) BP: (145-211)/(101-181) 147/107 (09/22 2056) SpO2:  [97 %-100 %] 100 % (09/22 2056)    Weight change: There were no vitals filed for this visit.  Intake/Output:   Intake/Output Summary (Last 24 hours) at 10/30/2017 2339 Last data filed at 10/30/2017 1945 Gross per 24 hour  Intake 22.49 ml  Output -  Net 22.49 ml      Physical Exam  General:  Well appearing. No resp difficulty HEENT: normal Neck: supple. JVP . Carotids 2+ bilat; no bruits. No lymphadenopathy or thyromegaly appreciated. Cor: PMI nondisplaced. Regular rate & rhythm. No rubs, gallops or murmurs. Lungs: clear Abdomen: soft, nontender, nondistended. No hepatosplenomegaly. No bruits or masses. Good bowel sounds. Extremities: no cyanosis, clubbing, rash, edema Neuro: alert & orientedx3, cranial nerves grossly intact. moves all 4 extremities w/o difficulty. Affect pleasant    EKG    Atrial fibrillation, rate 120 bpm, LVH, ST depressions in the inferolateral leads  Labs   Basic Metabolic Panel: Recent Labs  Lab 10/30/17 1704  NA 145  K 3.6  CL 107  CO2 28  GLUCOSE 94  BUN 17  CREATININE 1.07*  CALCIUM 9.6    Liver Function Tests: Recent Labs  Lab 10/30/17 1704  AST 27  ALT 23  ALKPHOS 62  BILITOT 0.7  PROT 7.5  ALBUMIN 3.8   No results for input(s): LIPASE, AMYLASE in the last 168 hours. No results for input(s): AMMONIA in the last 168 hours.  CBC: Recent Labs  Lab 10/30/17 1704  WBC 9.5  NEUTROABS 6.8  HGB 13.3  HCT 41.6  MCV 84.0  PLT 284    Cardiac Enzymes: No results for input(s): CKTOTAL, CKMB, CKMBINDEX, TROPONINI in the last 168 hours.  BNP: BNP (last 3 results) No results for  input(s): BNP in the last 8760 hours.  ProBNP (last 3 results) No results for input(s): PROBNP in the last 8760 hours.   CBG: No results for input(s): GLUCAP in the last 168 hours.  Coagulation Studies: Recent Labs    10/30/17 1704  LABPROT 14.5  INR 1.14     Imaging   Dg Chest Port 1 View  Result Date: 10/30/2017 CLINICAL DATA:  Palpitations EXAM: PORTABLE CHEST 1 VIEW COMPARISON:  10/14/2017 FINDINGS: Mild cardiomegaly. No confluent opacities, effusions or edema. No acute bony abnormality. IMPRESSION: Cardiomegaly.  No active disease. Electronically Signed   By: Charlett Nose M.D.   On: 10/30/2017 17:46      Medications:     Current Medications: . carvedilol  25 mg Oral BID  . etomidate  10 mg Intravenous Once  . fentaNYL      . fentaNYL (SUBLIMAZE) injection  25 mcg Intravenous Once  . flecainide  100 mg Oral BID  . ipratropium-albuterol  3 mL Nebulization Q6H  . [START ON 10/31/2017] pantoprazole  40 mg Oral Daily  . rivaroxaban  20 mg Oral Daily     Infusions: . sodium chloride 75 mL/hr at 10/30/17 1732  . sodium chloride 75 mL/hr at 10/30/17 2002  . diltiazem (CARDIZEM) infusion 15 mg/hr (10/30/17 2317)       Assessment/Plan   1. Atrial fibrillation with rapid ventricular rate  -  Admit to telemetry -  Agree with intravenous AV nodal blockers such as diltiazem to control the heart rate to <120 bpm -  Continue Xarelto (rivaroxaban) -  Continue Flecainide 100 mg twice daily  -  Continue carvedilol -  Will get EP input regarding timing of AFIB ablation  2. Chest pain when tachycardic with transient ST depressions on ECG  - Trend cardiac biomarkers and obtain serial ECGs - Consider aspirin 81 mg daily - Consider an ischemic evaluation if Troponins are elevated   Lonie Peak, MD  10/30/2017, 11:39 PM  Cardiology Overnight Team Please contact [facility] for night-coverage after hours (4p -7a ) and weekends on [email or web address]

## 2017-10-30 NOTE — Progress Notes (Signed)
I notified [facility] on call that pt is [facility] and had not yet been seen.

## 2017-10-30 NOTE — ED Notes (Signed)
ED TO INPATIENT HANDOFF REPORT  Name/Age/Gender Mary Poole 67 y.o. female  Code Status    Code Status Orders  (From admission, onward)         Start     Ordered   10/30/17 1927  Full code  Continuous     10/30/17 1926        Code Status History    Date Active Date Inactive Code Status Order ID Comments User Context   10/15/2017 0319 10/15/2017 1635 Full Code [id number]  Toy Baker, MD ED   09/18/2017 0131 09/18/2017 1853 Full Code [id number]  Toy Baker, MD Inpatient   02/22/2017 2124 02/24/2017 2221 Full Code [id number]  Elwin Mocha, MD ED      Home/SNF/Other Home  Chief Complaint Chest Pain/ Elevated Heart Rate / Afib / Numbness in Left Arm   Level of Care/Admitting Diagnosis ED Disposition    ED Disposition Condition Cope: [place] [100100]  Level of Care: Telemetry [5]  Diagnosis: Atrial fibrillation with [facility]) [989211]  Admitting Physician: Harvie Bridge [[id number]]  Attending Physician: Harvie Bridge [[id number]]  PT Class (Do Not Modify): Observation [104]  PT Acc Code (Do Not Modify): Observation [10022]       Medical History Past Medical History:  Diagnosis Date  . GERD (gastroesophageal reflux disease)   . H/O atrial flutter   . Hypertension   . Migraine   . Sleep apnea     Allergies Allergies  Allergen Reactions  . Diphenhydramine Hcl Other (See Comments)    IV Location/Drains/Wounds Patient Lines/Drains/Airways Status   Active Line/Drains/Airways    Name:   Placement date:   Placement time:   Site:   Days:   Peripheral IV 10/30/17 Left Antecubital   10/30/17    1717    Antecubital   less than 1   Peripheral IV 10/30/17 Right Wrist   10/30/17    1718    Wrist   less than 1          Labs/Imaging Results for orders placed or performed during the hospital encounter of 10/30/17 (from the past 48 hour(s))  Protime-INR     Status: None   Collection Time: 10/30/17   5:04 PM  Result Value Ref Range   Prothrombin Time 14.5 11.4 - 15.2 seconds   INR 1.14     Comment: Performed at [facility], [place] [address], [place], [place] [address]  Comprehensive metabolic panel     Status: Abnormal   Collection Time: 10/30/17  5:04 PM  Result Value Ref Range   Sodium 145 135 - 145 mmol/L   Potassium 3.6 3.5 - 5.1 mmol/L   Chloride 107 98 - 111 mmol/L   CO2 28 22 - 32 mmol/L   Glucose, Bld 94 70 - 99 mg/dL   BUN 17 8 - 23 mg/dL   Creatinine, Ser 1.07 (H) 0.44 - 1.00 mg/dL   Calcium 9.6 8.9 - 10.3 mg/dL   Total Protein 7.5 6.5 - 8.1 g/dL   Albumin 3.8 3.5 - 5.0 g/dL   AST 27 15 - 41 U/L   ALT 23 0 - 44 U/L   Alkaline Phosphatase 62 38 - 126 U/L   Total Bilirubin 0.7 0.3 - 1.2 mg/dL   GFR calc non Af Amer 52 (L) >60 mL/min   GFR calc Af Amer >60 >60 mL/min    Comment: (NOTE) The eGFR has been calculated using the CKD EPI equation.  This calculation has not been validated in all clinical situations. eGFR's persistently <60 mL/min signify possible Chronic Kidney Disease.    Anion gap 10 5 - 15    Comment: Performed at [facility], [place] [address], [place], [place] [address]  CBC with Differential/Platelet     Status: Abnormal   Collection Time: 10/30/17  5:04 PM  Result Value Ref Range   WBC 9.5 4.0 - 10.5 K/uL   RBC 4.95 3.87 - 5.11 MIL/uL   Hemoglobin 13.3 12.0 - 15.0 g/dL   HCT 41.6 36.0 - 46.0 %   MCV 84.0 78.0 - 100.0 fL   MCH 26.9 26.0 - 34.0 pg   MCHC 32.0 30.0 - 36.0 g/dL   RDW 16.2 (H) 11.5 - 15.5 %   Platelets 284 150 - 400 K/uL   Neutrophils Relative % 71 %   Neutro Abs 6.8 1.7 - 7.7 K/uL   Lymphocytes Relative 20 %   Lymphs Abs 1.9 0.7 - 4.0 K/uL   Monocytes Relative 8 %   Monocytes Absolute 0.7 0.1 - 1.0 K/uL   Eosinophils Relative 1 %   Eosinophils Absolute 0.1 0.0 - 0.7 K/uL   Basophils Relative 0 %   Basophils Absolute 0.0 0.0 - 0.1 K/uL    Comment: Performed at [facility], [place] [address] [place], [place] [address]  I-stat troponin, ED     Status: None   Collection Time: 10/30/17  5:52 PM  Result Value Ref Range   Troponin i, poc 0.00 0.00 - 0.08 ng/mL   Comment 3            Comment: Due to the release kinetics of cTnI, a negative result within the first hours of the onset of symptoms does not rule out myocardial infarction with certainty. If myocardial infarction is still suspected, repeat the test at appropriate intervals.    Dg Chest Port 1 View  Result Date: 10/30/2017 CLINICAL DATA:  Palpitations EXAM: PORTABLE CHEST 1 VIEW COMPARISON:  10/14/2017 FINDINGS: Mild cardiomegaly. No confluent opacities, effusions or edema. No acute bony abnormality. IMPRESSION: Cardiomegaly.  No active disease. Electronically Signed   By: Rolm Baptise M.D.   On: 10/30/2017 17:46    Pending Labs Unresulted Labs (From admission, onward)    Start     Ordered   10/31/17 0981  Basic metabolic panel  Tomorrow morning,   R     10/30/17 1926   10/31/17 0500  CBC  Tomorrow morning,   R     10/30/17 1926   10/31/17 0500  Protime-INR  Tomorrow morning,   R     10/30/17 1926   10/31/17 0500  APTT  Tomorrow morning,   R     10/30/17 1926   10/30/17 1927  Magnesium  Add-on,   R     10/30/17 1926   10/30/17 1927  Phosphorus  Add-on,   R     10/30/17 1926   10/30/17 1927  TSH  Once,   R     10/30/17 1926   10/30/17 1927  Troponin I  Now then every 6 hours,   R     10/30/17 1926          Vitals/Pain Today's Vitals   10/30/17 1728 10/30/17 1734 10/30/17 1830 10/30/17 1915  BP: (!) 174/124 (!) 153/109 (!) 211/181   Pulse: (!) 126 (!) 124 (!) 123   Resp: 20 19 (!) 38   Temp:  98.6 F (37 C)  TempSrc:  Oral    SpO2: 100% 100% 100%   PainSc:    0-No pain    Isolation Precautions No active isolations  Medications Medications  0.9 %  sodium chloride infusion ( Intravenous New Bag/Given 10/30/17 1732)  diltiazem (CARDIZEM) 1 mg/mL load via infusion 10  mg (10 mg Intravenous Bolus from Bag 10/30/17 1732)    And  diltiazem (CARDIZEM) 100 mg in dextrose 5% 141m (1 mg/mL) infusion (5 mg/hr Intravenous New Bag/Given 10/30/17 1729)  etomidate (AMIDATE) injection 10 mg (has no administration in time range)  fentaNYL (SUBLIMAZE) injection 25 mcg (has no administration in time range)  fentaNYL (SUBLIMAZE) 100 MCG/2ML injection (has no administration in time range)  carvedilol (COREG) tablet 25 mg (has no administration in time range)  rivaroxaban (XARELTO) tablet 20 mg (has no administration in time range)  pantoprazole (PROTONIX) EC tablet 40 mg (has no administration in time range)  flecainide (TAMBOCOR) tablet 100 mg (has no administration in time range)  0.9 %  sodium chloride infusion (has no administration in time range)  acetaminophen (TYLENOL) tablet 650 mg (has no administration in time range)    Or  acetaminophen (TYLENOL) suppository 650 mg (has no administration in time range)  oxyCODONE (Oxy IR/ROXICODONE) immediate release tablet 5 mg (has no administration in time range)  senna-docusate (Senokot-S) tablet 1 tablet (has no administration in time range)  bisacodyl (DULCOLAX) EC tablet 5 mg (has no administration in time range)  magnesium citrate solution 1 Bottle (has no administration in time range)  ondansetron (ZOFRAN) tablet 4 mg (has no administration in time range)    Or  ondansetron (ZOFRAN) injection 4 mg (has no administration in time range)  ipratropium-albuterol (DUONEB) 0.5-2.5 (3) MG/3ML nebulizer solution 3 mL (has no administration in time range)  sodium chloride 0.9 % bolus 250 mL (0 mLs Intravenous Stopped 10/30/17 1838)    Mobility walks

## 2017-10-30 NOTE — Progress Notes (Signed)
I paged [facility].

## 2017-10-30 NOTE — ED Notes (Signed)
Bed: WA22 Expected date:  Expected time:  Means of arrival:  Comments: Triage 1  

## 2017-10-30 NOTE — ED Notes (Signed)
[facility], was informed by secretary that I needed to call back "in a little while" because they were doing shift change. I asked to speak to charge because I was more familiar with my patient that oncoming nurse and would like to be the one to give report. Secretary told me to "hold on". I was on hold for approx 10 minutes. I disconnected the call.

## 2017-10-30 NOTE — ED Triage Notes (Signed)
Pt states she noticed her left arm numb and she is experiencing some chest discomfort. Adds that she was recently diagnosed with Afib and chart review collaborates a recent hospitalization for the same. She states she tried to "bare down at home" and she doesn't report nor seem to be in obvious distress.

## 2017-10-30 NOTE — Progress Notes (Signed)
Report received from HideoutLilibeth, Charity fundraiserN at [facility]. She is calling [facility] and patient will be then [facility].

## 2017-10-30 NOTE — H&P (Signed)
History and Physical   [facility] [place] [facility] Admission History and Physical [employer], D.O.    Patient Name: Mary Poole MR#: [id number] Date of Birth: [birth year] Date of Admission: 10/30/2017  Referring MD/NP/PA: Dr. Deretha Emory Primary Care Physician: Everrett Coombe, DO  Chief Complaint:  Chief Complaint  Patient presents with  . Tachycardia  . Arm Numbness    HPI: Mary Poole is a 67 y.o. female with a known history of atrial fibrillation, obstructive sleep apnea, hypertension, migraines, gastroesophageal reflux presents to the emergency department for evaluation of palpitations.  Patient was in a usual state of health until August she was cardioverted for rapid atrial fibrillation however cardioversion was ineffective.  She was started on Eliquis.  She returned to the emergency department for similar symptoms on September 6 and was started on a Cardizem drip with a plan to cardiovert in the morning however she converted spontaneously.  She was started on flecainide at that time and cardiology had planned for an ablation on October 25 however she returns to the emergency department tonight with rapid heart rate associated with left arm pain and numbness.  In the emergency department she was started on a diltiazem drip and plan was to cardiovert her yet again however her rate came down into the 90s to low 100s and her symptoms improved.  The emergency department physician discussed with cardiology who requested that she be transferred to [facility].  Patient denies fevers/chills, weakness, dizziness, chest pain, shortness of breath, N/V/C/D, abdominal pain, dysuria/frequency, changes in mental status.    Otherwise there has been no change in status. Patient has been taking medication as prescribed and there has been no recent change in medication or diet.  No recent antibiotics.  There has been no recent illness, hospitalizations, travel or sick contacts.      Review of Systems:  CONSTITUTIONAL: No fever/chills, fatigue, weakness, weight gain/loss, headache. EYES: No blurry or double vision. ENT: No tinnitus, postnasal drip, redness or soreness of the oropharynx. RESPIRATORY: No cough, dyspnea, wheeze.  No hemoptysis.  CARDIOVASCULAR: Positive palpitations and left arm pain and numbness..  No chest pain, syncope, orthopnea. No lower extremity edema.  GASTROINTESTINAL: No nausea, vomiting, abdominal pain, diarrhea, constipation.  No hematemesis, melena or hematochezia. GENITOURINARY: No dysuria, frequency, hematuria. ENDOCRINE: No polyuria or nocturia. No heat or cold intolerance. HEMATOLOGY: No anemia, bruising, bleeding. INTEGUMENTARY: No rashes, ulcers, lesions. MUSCULOSKELETAL: No arthritis, gout. NEUROLOGIC: No numbness, tingling, ataxia, seizure-type activity, weakness. PSYCHIATRIC: No anxiety, depression, insomnia.   Past Medical History:  Diagnosis Date  . GERD (gastroesophageal reflux disease)   . H/O atrial flutter   . Hypertension   . Migraine   . Sleep apnea     Past Surgical History:  Procedure Laterality Date  . COLONOSCOPY N/A 02/24/2017   Procedure: COLONOSCOPY;  Surgeon: Jeani Hawking, MD;  Location: [facility] ENDOSCOPY;  Service: Gastroenterology;  Laterality: N/A;  . ENTEROSCOPY N/A 02/24/2017   Procedure: ENTEROSCOPY;  Surgeon: Jeani Hawking, MD;  Location: [facility] ENDOSCOPY;  Service: Gastroenterology;  Laterality: N/A;  . HERNIA REPAIR    . WISDOM TOOTH EXTRACTION       reports that she has quit smoking. She has never used smokeless tobacco. She reports that she does not drink alcohol or use drugs.  Allergies  Allergen Reactions  . Diphenhydramine Hcl Other (See Comments)    Family History  Problem Relation Age of Onset  . CAD Father   . AAA (abdominal aortic aneurysm) Sister   .  Thyroid disease Sister     Prior to Admission medications   Medication Sig Start Date End Date Taking? Authorizing Provider   carvedilol (COREG) 25 MG tablet Take 1 tablet (25 mg total) by mouth 2 (two) times daily. 10/05/17 01/03/18  Camnitz, Andree CossWill Martin, MD  flecainide (TAMBOCOR) 100 MG tablet Take 1 tablet (100 mg total) by mouth 2 (two) times daily. 10/05/17   Camnitz, Andree CossWill Martin, MD  omeprazole (PRILOSEC) 20 MG capsule Take 1 capsule (20 mg total) by mouth daily. 07/28/17   Everrett CoombeMatthews, Cody, DO  XARELTO 20 MG TABS tablet TAKE 1 TABLET BY MOUTH EVERY DAY 06/20/17   Lyn RecordsSmith, Henry W, MD    Physical Exam: Vitals:   10/30/17 1700 10/30/17 1728 10/30/17 1734 10/30/17 1830  BP: (!) 151/104 (!) 174/124 (!) 153/109 (!) 211/181  Pulse:  (!) 126 (!) 124 (!) 123  Resp: (!) 21 20 19  (!) 38  Temp:   98.6 F (37 C)   TempSrc:   Oral   SpO2:  100% 100% 100%    GENERAL: 67 y.o.-year-old Afro-American female patient, well-developed, well-nourished lying in the bed in no acute distress.  Pleasant and cooperative.   HEENT: Poole atraumatic, normocephalic. Pupils equal. Mucus membranes moist. NECK: Supple. No JVD. CHEST: Normal breath sounds bilaterally. No wheezing, rales, rhonchi or crackles. No use of accessory muscles of respiration.  No reproducible chest wall tenderness.  CARDIOVASCULAR: Irregularly irregular S1, S2 normal. No murmurs, rubs, or gallops. Cap refill <2 seconds. Pulses intact distally.  ABDOMEN: Soft, nondistended, nontender. No rebound, guarding, rigidity. Normoactive bowel sounds present in all four quadrants.  EXTREMITIES: No pedal edema, cyanosis, or clubbing. No calf tenderness or Homan's sign.  NEUROLOGIC: The patient is alert and oriented x 3. Cranial nerves II through XII are grossly intact with no focal sensorimotor deficit. PSYCHIATRIC:  Normal affect, mood, thought content. SKIN: Warm, dry, and intact without obvious rash, lesion, or ulcer.    Labs on Admission:  CBC: Recent Labs  Lab 10/30/17 1704  WBC 9.5  NEUTROABS 6.8  HGB 13.3  HCT 41.6  MCV 84.0  PLT 284   Basic Metabolic  Panel: Recent Labs  Lab 10/30/17 1704  NA 145  K 3.6  CL 107  CO2 28  GLUCOSE 94  BUN 17  CREATININE 1.07*  CALCIUM 9.6   GFR: Estimated Creatinine Clearance: 58.4 mL/min (A) (by C-G formula based on SCr of 1.07 mg/dL (H)). Liver Function Tests: Recent Labs  Lab 10/30/17 1704  AST 27  ALT 23  ALKPHOS 62  BILITOT 0.7  PROT 7.5  ALBUMIN 3.8   No results for input(s): LIPASE, AMYLASE in the last 168 hours. No results for input(s): AMMONIA in the last 168 hours. Coagulation Profile: Recent Labs  Lab 10/30/17 1704  INR 1.14   Cardiac Enzymes: No results for input(s): CKTOTAL, CKMB, CKMBINDEX, TROPONINI in the last 168 hours. BNP (last 3 results) No results for input(s): PROBNP in the last 8760 hours. HbA1C: No results for input(s): HGBA1C in the last 72 hours. CBG: No results for input(s): GLUCAP in the last 168 hours. Lipid Profile: No results for input(s): CHOL, HDL, LDLCALC, TRIG, CHOLHDL, LDLDIRECT in the last 72 hours. Thyroid Function Tests: No results for input(s): TSH, T4TOTAL, FREET4, T3FREE, THYROIDAB in the last 72 hours. Anemia Panel: No results for input(s): VITAMINB12, FOLATE, FERRITIN, TIBC, IRON, RETICCTPCT in the last 72 hours. Urine analysis: No results found for: COLORURINE, APPEARANCEUR, LABSPEC, PHURINE, GLUCOSEU, HGBUR, BILIRUBINUR, KETONESUR, PROTEINUR, UROBILINOGEN, NITRITE,  LEUKOCYTESUR Sepsis Labs: @LABRCNTIP (procalcitonin:4,lacticidven:4) )No results found for this or any previous visit (from the past 240 hour(s)).   Radiological Exams on Admission: Dg Chest Port 1 View  Result Date: 10/30/2017 CLINICAL DATA:  Palpitations EXAM: PORTABLE CHEST 1 VIEW COMPARISON:  10/14/2017 FINDINGS: Mild cardiomegaly. No confluent opacities, effusions or edema. No acute bony abnormality. IMPRESSION: Cardiomegaly.  No active disease. Electronically Signed   By: Charlett Nose M.D.   On: 10/30/2017 17:46    EKG: Atrial tachycardia at 120 bpm with normal  axis and nonspecific ST-T wave changes.   Assessment/Plan  This is a 67 y.o. female with a history of atrial fibrillation, obstructive sleep apnea, hypertension, migraines, gastroesophageal reflux now being admitted with:  #.  Symptomatic atrial fibrillation with rapid ventricular response Admit to inpatient telemetry at [facility] to consult Continue diltiazem drip Continue Coreg Continue flecainide Continue Xarelto  #.  History of GERD Continue Prilosec  Admission status: Inpatient, telemetry IV Fluids: Hep-Lock Diet/Nutrition: Heart healthy, n.p.o. after midnight Consults called: Cardiology DVT Px: Xarelto SCDs and early ambulation. Code Status: Full Code  Disposition Plan: To home in 1-2 days  All the records are reviewed and case discussed with ED provider. Management plans discussed with the patient and/or family who express understanding and agree with plan of care.  Fareeha Evon D.O. on 10/30/2017 at 6:49 PM CC: Primary care physician; Everrett Coombe, DO   10/30/2017, 6:49 PM

## 2017-10-30 NOTE — Progress Notes (Signed)
I notified Vernona RiegerLaura in Pharmacy that pt needs Med list completed.

## 2017-10-30 NOTE — ED Provider Notes (Addendum)
[facility]-EMERGENCY DEPT Provider Note   CSN: [id number] Arrival date & time: 10/30/17  1555     History   Chief Complaint Chief Complaint  Patient presents with  . Tachycardia  . Arm Numbness    HPI Mary Poole is a 67 y.o. female.  Patient with known history of atrial fibrillation with rapid ventricular rates been much more of a problem in the month of August and September.  Patient was seen August 10 for a rapid atrial fibrillation was cardioverted x2 since patient has been religious about taking her Eliquis.  Each of those cardioversions 120 J only held her rate for 1 or 2 minutes and she was back into a rapid rate.  Patient was recently seen in the emergency department on September 6 also for rapid atrial fibrillation.  At this time patient was started on diltiazem drip and she was admitted and she converted overnight.  Patient seen by cardiology recently.  They are planning an ablation for October 25.  Patient's been on flecainide for about a month.  She is also on Coreg and she is on Eliquis.  Patient feels the flecainide is not helping hold her atrial fibrillation at all.  Patient stated that she started to get a rapid heart rate about 2 hours prior to arrival.  She had about 20 minutes where she had some left arm pain.  No anterior chest pain.  I first saw patient patient's heart rate was in the 120s 130s.  Elevated blood pressure.     Past Medical History:  Diagnosis Date  . GERD (gastroesophageal reflux disease)   . H/O atrial flutter   . Hypertension   . Migraine   . Sleep apnea     Patient Active Problem List   Diagnosis Date Noted  . Atrial flutter with rapid ventricular response (HCC) 10/15/2017  . Sinus bradycardia 09/18/2017  . OSA (obstructive sleep apnea)   . Thyroid disease 07/20/2017  . Atrial flutter (HCC) 06/09/2017  . Iron deficiency anemia due to chronic blood loss   . Normocytic anemia 02/22/2017  . Accessory  atrioventricular pathway 09/17/2016  . On continuous oral anticoagulation 09/17/2016  . Snoring 09/17/2016  . Paroxysmal atrial fibrillation (HCC) 09/16/2016  . Essential hypertension 07/19/2016  . Sensorineural hearing loss (SNHL), bilateral 11/20/2015  . Laryngopharyngeal reflux (LPR) 11/20/2015  . Perennial allergic rhinitis 11/20/2015  . Tinnitus, bilateral 11/20/2015  . GERD (gastroesophageal reflux disease) 07/15/2011    Past Surgical History:  Procedure Laterality Date  . COLONOSCOPY N/A 02/24/2017   Procedure: COLONOSCOPY;  Surgeon: Jeani Hawking, MD;  Location: [facility] ENDOSCOPY;  Service: Gastroenterology;  Laterality: N/A;  . ENTEROSCOPY N/A 02/24/2017   Procedure: ENTEROSCOPY;  Surgeon: Jeani Hawking, MD;  Location: [facility] ENDOSCOPY;  Service: Gastroenterology;  Laterality: N/A;  . HERNIA REPAIR    . WISDOM TOOTH EXTRACTION       OB History   None      Home Medications    Prior to Admission medications   Medication Sig Start Date End Date Taking? Authorizing Provider  carvedilol (COREG) 25 MG tablet Take 1 tablet (25 mg total) by mouth 2 (two) times daily. 10/05/17 01/03/18  Camnitz, Andree Coss, MD  flecainide (TAMBOCOR) 100 MG tablet Take 1 tablet (100 mg total) by mouth 2 (two) times daily. 10/05/17   Camnitz, Andree Coss, MD  omeprazole (PRILOSEC) 20 MG capsule Take 1 capsule (20 mg total) by mouth daily. 07/28/17   Everrett Coombe, DO  XARELTO 20 MG  TABS tablet TAKE 1 TABLET BY MOUTH EVERY DAY 06/20/17   Lyn Records, MD    Family History Family History  Problem Relation Age of Onset  . CAD Father   . AAA (abdominal aortic aneurysm) Sister   . Thyroid disease Sister     Social History Social History   Tobacco Use  . Smoking status: Former Games developer  . Smokeless tobacco: Never Used  Substance Use Topics  . Alcohol use: No  . Drug use: No     Allergies   Diphenhydramine hcl   Review of Systems Review of Systems  Constitutional: Negative for fever.  HENT:  Negative for congestion.   Eyes: Negative for redness.  Respiratory: Negative for shortness of breath.   Cardiovascular: Positive for palpitations. Negative for chest pain.  Gastrointestinal: Negative for abdominal pain.  Genitourinary: Negative for dysuria and hematuria.  Musculoskeletal: Negative for back pain and neck pain.  Skin: Negative for rash.  Neurological: Negative for syncope and light-headedness.  Hematological: Bruises/bleeds easily.  Psychiatric/Behavioral: Negative for confusion.     Physical Exam Updated Vital Signs BP (!) 153/109 (BP Location: Right Arm)   Pulse (!) 124   Temp 98.6 F (37 C) (Oral)   Resp 19   SpO2 100%   Physical Exam  Constitutional: She is oriented to person, place, and time. She appears well-developed and well-nourished.  HENT:  Head: Normocephalic and atraumatic.  Mouth/Throat: Oropharynx is clear and moist.  Cardiovascular:  Rapid irregular rate  Pulmonary/Chest: Effort normal and breath sounds normal. No respiratory distress.  Abdominal: Soft. Bowel sounds are normal. There is no tenderness.  Musculoskeletal: Normal range of motion. She exhibits no edema.  Neurological: She is alert and oriented to person, place, and time. No cranial nerve deficit or sensory deficit. She exhibits normal muscle tone. Coordination normal.  Skin: Skin is warm. No rash noted.  Nursing note and vitals reviewed.    ED Treatments / Results  Labs (all labs ordered are listed, but only abnormal results are displayed) Labs Reviewed  CBC WITH DIFFERENTIAL/PLATELET - Abnormal; Notable for the following components:      Result Value   RDW 16.2 (*)    All other components within normal limits  PROTIME-INR  COMPREHENSIVE METABOLIC PANEL  I-STAT TROPONIN, ED    EKG EKG Interpretation  Date/Time:  Sunday October 30 2017 17:18:18 EDT Ventricular Rate:  120 PR Interval:    QRS Duration: 93 QT Interval:  285 QTC Calculation: 403 R Axis:   74 Text  Interpretation:  Ectopic atrial tachycardia, unifocal Probable LVH with secondary repol abnrm ST depression, consider ischemia, diffuse lds Confirmed by Vanetta Mulders [phone]) on 10/30/2017 5:28:39 PM   Radiology No results found.  Procedures Procedures (including critical care time) \CRITICAL CARE Performed by: Vanetta Mulders Total critical care time: 60 minutes Critical care time was exclusive of separately billable procedures and treating other patients. Critical care was necessary to treat or prevent imminent or life-threatening deterioration. Critical care was time spent personally by me on the following activities: development of treatment plan with patient and/or surrogate as well as nursing, discussions with consultants, evaluation of patient's response to treatment, examination of patient, obtaining history from patient or surrogate, ordering and performing treatments and interventions, ordering and review of laboratory studies, ordering and review of radiographic studies, pulse oximetry and re-evaluation of patient's condition.   Medications Ordered in ED Medications  0.9 %  sodium chloride infusion ( Intravenous New Bag/Given 10/30/17 1732)  sodium chloride 0.9 %  bolus 250 mL (250 mLs Intravenous New Bag/Given 10/30/17 1733)  diltiazem (CARDIZEM) 1 mg/mL load via infusion 10 mg (10 mg Intravenous Bolus from Bag 10/30/17 1732)    And  diltiazem (CARDIZEM) 100 mg in dextrose 5% (1 mg/mL) infusion (5 mg/hr Intravenous New Bag/Given 10/30/17 1729)  etomidate (AMIDATE) injection 10 mg (has no administration in time range)  fentaNYL (SUBLIMAZE) injection 25 mcg (has no administration in time range)  fentaNYL (SUBLIMAZE) 100 MCG/2ML injection (has no administration in time range)     Initial Impression / Assessment and Plan / ED Course  I have reviewed the triage vital signs and the nursing notes.  Pertinent labs & imaging results that were available during my care of the  patient were reviewed by me and considered in my medical decision making (see chart for details).      When patient first came in heart rate was in the 120s 130s.  Initial EKG upfront had a heart rate higher.  Patient actually on the hypertensive side.  Reviewed her records and saw that attempted cardioversion x2 in August was not successful.  Patient certainly a candidate for cardioversion because she is been on her Eliquis.  She responded better to management on September 6 with admission and will diltiazem.  Based on this was planning to treat with diltiazem.  That was ordered.  Then patient went into a super fast heart rate with heart rate going up into the 125 range.  Narrow complex QRS duration was 84.  Consistent would like an SVT or rapid atrial fib.  Patient had no symptoms.  However did develop the left arm pain like she had at home.  So this may occur when heart rate is very fast.  Was planning it with this rate to go ahead and cardiovert her she was to receive Eliquis she was placed on oxygen she was can receive some fentanyl.  Pads were placed.  Was going to cardiovert her at 120 J.  And then she spontaneously went into a ectopic atrial tachycardia with a rate of 120.  So she was not cardioverted.  Following this patient's diltiazem drip was started.  Patient discussed with cardiology.  Sequence of events between her rhythm when she arrived to when she went into the significant tachycardia with heart rate around 225.  And then the spontaneous conversion down to a heart rate of 120 with an ectopic atrial tachycardia probably everything underlying is atrial fibrillation.  Flutter not completely ruled out.  Patient now currently on Cardizem drip.  She received after going back down to 120 she received 10 mg grams of Cardizem.  And started on drip.  Heart rate now around 100.  Chest x-ray negative.  Initial troponin negative.  Labs without significant abnormalities.  Patient scheduled for ablation  due to difficult to control atrial fibrillation.  On October 25.  Cardiology once patient admitted by the hospitalist service at [facility].  Hopefully will be some consideration for earlier ablation.  Patient went back into a brief period of a rapid heart rate in the 200s.  Which on the monitor is suggestive of V. tach the patient physiologically does not respond like V. tach remains alert thinking fine and normal blood pressures.  When she spontaneously converted back down again to a heart rate in the low 100s.  Patient will be treated deferred over to [facility] hospitalist to put in the orders.  [facility] will transfer patient.   Final Clinical Impressions(s) / ED Diagnoses  Final diagnoses:  Atrial fibrillation with RVR [facility](HCC)    ED Discharge Orders    None       Vanetta MuldersZackowski, Isaul Landi, MD 10/30/17 1746    Vanetta MuldersZackowski, Neidy Guerrieri, MD 10/30/17 [phone]

## 2017-10-31 ENCOUNTER — Other Ambulatory Visit: Payer: Self-pay

## 2017-10-31 DIAGNOSIS — I4891 Unspecified atrial fibrillation: Secondary | ICD-10-CM | POA: Diagnosis not present

## 2017-10-31 DIAGNOSIS — I480 Paroxysmal atrial fibrillation: Secondary | ICD-10-CM | POA: Diagnosis not present

## 2017-10-31 MED ORDER — AMIODARONE HCL 200 MG PO TABS
400.0000 mg | ORAL_TABLET | Freq: Two times a day (BID) | ORAL | Status: DC
  Administered 2017-10-31: 400 mg via ORAL
  Filled 2017-10-31: qty 2

## 2017-10-31 MED ORDER — METOPROLOL TARTRATE 5 MG/5ML IV SOLN
5.0000 mg | Freq: Once | INTRAVENOUS | Status: AC
  Administered 2017-10-31: 5 mg via INTRAVENOUS
  Filled 2017-10-31: qty 5

## 2017-10-31 MED ORDER — DILTIAZEM HCL ER COATED BEADS 180 MG PO CP24: 180.0000 mg | ORAL_CAPSULE | Freq: Every day | ORAL | 0 refills | Status: DC

## 2017-10-31 MED ORDER — AMIODARONE HCL 400 MG PO TABS: 400.0000 mg | ORAL_TABLET | Freq: Two times a day (BID) | ORAL | 0 refills | Status: DC

## 2017-10-31 MED ORDER — DILTIAZEM HCL ER COATED BEADS 180 MG PO CP24
180.0000 mg | ORAL_CAPSULE | Freq: Every day | ORAL | Status: DC
  Administered 2017-10-31: 180 mg via ORAL
  Filled 2017-10-31: qty 1

## 2017-10-31 MED FILL — Medication: Qty: 1 | Status: AC

## 2017-10-31 NOTE — Consult Note (Addendum)
Cardiology Consultation:   Patient ID: Mary Poole MRN: [id number]; DOB: 1950/06/24  Admit date: 10/30/2017 Date of Consult: 10/31/2017  Primary Care Provider: Everrett Coombe, DO Primary Cardiologist: Dr. Katrinka Poole Primary Electrophysiologist:  Mary Poole Mary Loa, MD    Patient Profile:   Mary Poole is a 67 y.o. female with a hx of OSA, HTN, and AFib/Flutter who is being seen today for the evaluation of recurrent AFlutter w/RVR at the request of Dr. Gilmore Poole.  History of Present Illness:   [name] was recently seen by Dr. [name], with plans to proceed with ablation strategy given recurrent palpitations on Flecainide.  Unfortunately she returned to [facility] ER again with rapid AFlutter.  Initially in the ER thoughts were to DCCV and hopefully discharge though rate slowed and in d/w cardiology service, decided to transfer to [facility] for further management, on dilt gtt for rate control.  C/o CP were elicited, that resolved with HR control  LABS POC Trop 0.00 K+ 3.6 BUN/Creat 17/1.07 WBC 9.5 H/H 13/41 Plts 284  10/15/17 TSH 0.452  Past Medical History:  Diagnosis Date  . GERD (gastroesophageal reflux disease)   . H/O atrial flutter   . Hypertension   . Migraine   . Sleep apnea     Past Surgical History:  Procedure Laterality Date  . COLONOSCOPY N/A 02/24/2017   Procedure: COLONOSCOPY;  Surgeon: Mary Hawking, MD;  Location: [facility] ENDOSCOPY;  Service: Gastroenterology;  Laterality: N/A;  . ENTEROSCOPY N/A 02/24/2017   Procedure: ENTEROSCOPY;  Surgeon: Mary Hawking, MD;  Location: [facility] ENDOSCOPY;  Service: Gastroenterology;  Laterality: N/A;  . HERNIA REPAIR    . WISDOM TOOTH EXTRACTION       Home Medications:  Prior to Admission medications   Medication Sig Start Date End Date Taking? Authorizing Provider  carvedilol (COREG) 25 MG tablet Take 1 tablet (25 mg total) by mouth 2 (two) times daily. 10/05/17 01/03/18  Zyniah Ferraiolo, Andree Coss, MD  flecainide (TAMBOCOR) 100 MG tablet Take 1  tablet (100 mg total) by mouth 2 (two) times daily. 10/05/17   Mary Poole, Andree Coss, MD  omeprazole (PRILOSEC) 20 MG capsule Take 1 capsule (20 mg total) by mouth daily. 07/28/17   Everrett Coombe, DO  XARELTO 20 MG TABS tablet TAKE 1 TABLET BY MOUTH EVERY DAY 06/20/17   Lyn Records, MD    Inpatient Medications: Scheduled Meds: . amiodarone  400 mg Oral BID  . carvedilol  25 mg Oral BID  . diltiazem  180 mg Oral Daily  . etomidate  10 mg Intravenous Once  . fentaNYL (SUBLIMAZE) injection  25 mcg Intravenous Once  . ipratropium-albuterol  3 mL Nebulization Q6H  . pantoprazole  40 mg Oral Daily  . rivaroxaban  20 mg Oral Daily   Continuous Infusions: . sodium chloride 75 mL/hr at 10/30/17 1732  . sodium chloride 75 mL/hr at 10/31/17 1000   PRN Meds: acetaminophen **OR** acetaminophen, bisacodyl, lip balm, magnesium citrate, ondansetron **OR** ondansetron (ZOFRAN) IV, oxyCODONE, senna-docusate  Allergies:    Allergies  Allergen Reactions  . Diphenhydramine Hcl Other (See Comments)    Social History:   Social History   Socioeconomic History  . Marital status: Married    Spouse name: Not on file  . Number of children: Not on file  . Years of education: Not on file  . Highest education level: Not on file  Occupational History  . Not on file  Social Needs  . Financial resource strain: Not hard at all  . Food  insecurity:    Worry: Never true    Inability: Never true  . Transportation needs:    Medical: No    Non-medical: No  Tobacco Use  . Smoking status: Former Games developermoker  . Smokeless tobacco: Never Used  Substance and Sexual Activity  . Alcohol use: No  . Drug use: No  . Sexual activity: Not on file  Lifestyle  . Physical activity:    Days per week: Not on file    Minutes per session: Not on file  . Stress: Not on file  Relationships  . Social connections:    Talks on phone: Not on file    Gets together: Not on file    Attends religious service: Not on file     Active member of club or organization: Not on file    Attends meetings of clubs or organizations: Not on file    Relationship status: Not on file  . Intimate partner violence:    Fear of current or ex partner: Not on file    Emotionally abused: Not on file    Physically abused: Not on file    Forced sexual activity: Not on file  Other Topics Concern  . Not on file  Social History Narrative  . Not on file    Family History:   Family History  Problem Relation Age of Onset  . CAD Father   . AAA (abdominal aortic aneurysm) Sister   . Thyroid disease Sister      ROS:  Please see the history of present illness.  All other ROS reviewed and negative.     Physical Exam/Data:   Vitals:   10/30/17 1945 10/30/17 2056 10/31/17 0356 10/31/17 0357  BP: (!) 145/101 (!) 147/107  (!) 155/81  Pulse: (!) 131 (!) 135  (!) 46  Resp: 15 16  18   Temp:  98.2 F (36.8 C)  97.7 F (36.5 C)  TempSrc:  Oral  Oral  SpO2: 100% 100%  99%  Weight:   95.6 kg     Intake/Output Summary (Last 24 hours) at 10/31/2017 1103 Last data filed at 10/31/2017 1000 Gross per 24 hour  Intake 1256.12 ml  Output 850 ml  Net 406.12 ml   Filed Weights   10/31/17 0356  Weight: 95.6 kg   Body mass index is 35.07 kg/m.  General:  Well nourished, well developed, in no acute distress HEENT: normal Lymph: no adenopathy Neck: no JVD Endocrine:  No thryomegaly Vascular: No carotid bruits Cardiac:  irreg-irreg; no murmurs, gallops or rubs Lungs: CTA b/l, no wheezing, rhonchi or rales  Abd: soft, nontender  Ext: no edema Musculoskeletal:  No deformities Skin: warm and dry  Neuro:   No gross focal abnormalities noted Psych:  Normal affect   EKG:  The EKG was personally reviewed and demonstrates:   #1 AFib 155bpm, QRS 89ms #2 likely 1:1 flutter 227bpm #3 AFlutter 120bpm Telemetry:  Telemetry was personally reviewed and demonstrates:   AFlutter 80's  Relevant CV Studies:  Echocardiogram - 09/18/2017 Normal  LV function; 55-60%, mild LVH; restrictive filling; moderate MR; severe LAE; mild TR with moderate pulmonary hypertension.  Myocardial perfusion scan - 10/06/2016 EF: 58%. The left ventricular ejection fraction is normal (55-65%). The study is normal. There is breast attenuation. No ischemia . no evidence of infarction This is a low risk study.  Holter 10/09/16 Basic rhythm is NSR rare PAC's and PVC's. Brief SVT < 10 beats. Heart rate range 43-113 bpm with average 63 bpm.  Laboratory Data:  Chemistry Recent Labs  Lab 10/30/17 1704  NA 145  K 3.6  CL 107  CO2 28  GLUCOSE 94  BUN 17  CREATININE 1.07*  CALCIUM 9.6  GFRNONAA 52*  GFRAA >60  ANIONGAP 10    Recent Labs  Lab 10/30/17 1704  PROT 7.5  ALBUMIN 3.8  AST 27  ALT 23  ALKPHOS 62  BILITOT 0.7   Hematology Recent Labs  Lab 10/30/17 1704  WBC 9.5  RBC 4.95  HGB 13.3  HCT 41.6  MCV 84.0  MCH 26.9  MCHC 32.0  RDW 16.2*  PLT 284   Cardiac EnzymesNo results for input(s): TROPONINI in the last 168 hours.  Recent Labs  Lab 10/30/17 1752  TROPIPOC 0.00    BNPNo results for input(s): BNP, PROBNP in the last 168 hours.  DDimer No results for input(s): DDIMER in the last 168 hours.  Radiology/Studies:   Dg Chest Port 1 View Result Date: 10/30/2017 CLINICAL DATA:  Palpitations EXAM: PORTABLE CHEST 1 VIEW COMPARISON:  10/14/2017 FINDINGS: Mild cardiomegaly. No confluent opacities, effusions or edema. No acute bony abnormality. IMPRESSION: Cardiomegaly.  No active disease. Electronically Signed   By: Charlett Nose M.D.   On: 10/30/2017 17:46    Assessment and Plan:   1. Recurrent AFlutter w/RVR, likely 1:1 flutter on #2 EKG     CHA2DS2Vasc is 3, on xarelto     Rate controlled on dilt gtt  Dr. [name] has seen and examined the patient this morning.  Dilt gtt stopped, flecainide stopped >> PO diltiazem and amiodarone.  In d/w Dr. [name],  If HR is well controlled with pt OOB, ambulating, on PO meds, would  not object to discharge late this afternoon.  Early follow up with AFib clinic is in place  I have discussed with the patient, and sent staff message to dr. Gershon Crane RN, the patient confirms 10/25 as a good date for her ablation.      For questions or updates, please contact [facility] Please consult [email or web address] for contact info under     Signed, Sheilah Pigeon, PA-C  10/31/2017 11:03 AM  I have seen and examined this patient with Francis Dowse.  Agree with above, note added to reflect my findings.  On exam, iRRR, no murmurs, lungs clear. Have started PO dilitazem and amiodarone. Should her rate be better controlled, would be OK for discharge. If not controlled may need to increase PO diltiazem.     Mahek Schlesinger M. Noe Pittsley MD 10/31/2017 12:09 PM

## 2017-10-31 NOTE — Discharge Instructions (Addendum)
You have an appointment set up with the Atrial Fibrillation Clinic.  Multiple studies have shown that being followed by a dedicated atrial fibrillation clinic in addition to the standard care you receive from your other physicians improves health. We believe that enrollment in the atrial fibrillation clinic will allow us to better care for you.  ° °The phone number to the Atrial Fibrillation Clinic is [phone]. The clinic is staffed Monday through Friday from 8:30am to 5pm. ° °Parking Directions: The clinic is located in the Heart and Vascular Building connected to  hospital. °1)From [address] turn on to [address] and go to the 3rd entrance  (Heart and Vascular entrance) on the right. °2)Look to the right for Heart &Vascular Parking Garage. °3)A code for the entrance is required please call the clinic to receive this.   °4)Take the elevators to the 1st floor. Registration is in the room with the glass walls at the end of the hallway. ° °If you have any trouble parking or locating the clinic, please don’t hesitate to call [phone]. ° °Information on my medicine - XARELTO® (Rivaroxaban) ° °Why was Xarelto® prescribed for you? °Xarelto® was prescribed for you to reduce the risk of a blood clot forming that can cause a stroke if you have a medical condition called atrial fibrillation (a type of irregular heartbeat). ° °What do you need to know about xarelto® ? °Take your Xarelto® ONCE DAILY at the same time every day with your evening meal. °If you have difficulty swallowing the tablet whole, you may crush it and mix in applesauce just prior to taking your dose. ° °Take Xarelto® exactly as prescribed by your doctor and DO NOT stop taking Xarelto® without talking to the doctor who prescribed the medication.  Stopping without other stroke prevention medication to take the place of Xarelto® may increase your risk of developing a clot that causes a stroke.  Refill your prescription before you  run out. ° °After discharge, you should have regular check-up appointments with your healthcare provider that is prescribing your Xarelto®.  In the future your dose may need to be changed if your kidney function or weight changes by a significant amount. ° °What do you do if you miss a dose? °If you are taking Xarelto® ONCE DAILY and you miss a dose, take it as soon as you remember on the same day then continue your regularly scheduled once daily regimen the next day. Do not take two doses of Xarelto® at the same time or on the same day.  ° °Important Safety Information °A possible side effect of Xarelto® is bleeding. You should call your healthcare provider right away if you experience any of the following: °? Bleeding from an injury or your nose that does not stop. °? Unusual colored urine (red or dark brown) or unusual colored stools (red or black). °? Unusual bruising for unknown reasons. °? A serious fall or if you hit your head (even if there is no bleeding). ° °Some medicines may interact with Xarelto® and might increase your risk of bleeding while on Xarelto®. To help avoid this, consult your healthcare provider or pharmacist prior to using any new prescription or non-prescription medications, including herbals, vitamins, non-steroidal anti-inflammatory drugs (NSAIDs) and supplements. ° °This website has more information on Xarelto®: www.xarelto.com. ° °

## 2017-10-31 NOTE — Progress Notes (Signed)
RT NOTES: Entered patient's room to administer duoneb. Patient refused stating she doesn't need it. Patient also stated she refused on night shift as well. Will continue to monitor.

## 2017-10-31 NOTE — Discharge Summary (Signed)
Physician Discharge Summary  EVAGELIA KNACK WUJ:[id number] DOB: 13-Aug-1950 DOA: 10/30/2017  PCP: Everrett Coombe, DO  Admit date: 10/30/2017 Discharge date: 10/31/2017  Admitted From: Home  Disposition:  Home   Recommendations for Outpatient Follow-up:  1. Follow up with Electrophysiology on Monday    Home Health: None  Equipment/Devices: None  Discharge Condition: Good  CODE STATUS: FULL Diet recommendation: Cardiac  Brief/Interim Summary: Mrs. Morsch is a 67 y.o. F with Afib, OSA on CPAP, HTN, and GERD who presented with palpitations.  She was recently admitted for same, but spontaneously converted with diltiazem.  Started on flecainide with plans for ablation later next month.  Now returned with rapid heart rate, right arm discomfort.  Started on diltiazem drip.      Discharge Diagnoses:   Atrial fibrillation with RVR, paroxysmal Treated with diltiazem.  EP were consulted, they stopped flecainide, started amiodarone and started diltiazem CD 180 mg.  Her carvedilol and Xarelto were continued.  Patient's heart rate was stable on oral medications and she was asympatomatic and discharged home with close EP follow up.   GERD On PPI  OSA Endorses compliance with CPAP     Discharge Instructions  Discharge Instructions    Diet - low sodium heart healthy   Complete by:  As directed    Discharge instructions   Complete by:  As directed    From Dr. Maryfrances Bunnell: You were admitted for Afib that was very fast.  Dr. [name] has adjusted your medications: Stop flecainide Start amiodarone 400 mg twice daily for two weeks (will need to adjust dose down soon Start diltiazem 180 mg once daily Continue your home carvedilol  I have sent a prescription to your pharmacy for all of these.  The amiodarone prescription is for two weeks, Dr. Gershon Crane office will adjust the dose at your next appointment on Monday.   Increase activity slowly   Complete by:  As directed      Allergies as  of 10/31/2017      Reactions   Diphenhydramine Hcl Hives, Other (See Comments)   Erratic heartbeat   Flecainide Rash      Medication List    STOP taking these medications   flecainide 100 MG tablet Commonly known as:  TAMBOCOR     TAKE these medications   amiodarone 400 MG tablet Commonly known as:  PACERONE Take 1 tablet (400 mg total) by mouth 2 (two) times daily.   carvedilol 25 MG tablet Commonly known as:  COREG Take 1 tablet (25 mg total) by mouth 2 (two) times daily.   diltiazem 180 MG 24 hr capsule Commonly known as:  CARDIZEM CD Take 1 capsule (180 mg total) by mouth daily. Start taking on:  11/01/2017   multivitamin Liqd Take 30 mLs by mouth daily.   omeprazole 20 MG capsule Commonly known as:  PRILOSEC Take 1 capsule (20 mg total) by mouth daily.   PRESCRIPTION MEDICATION Inhale into the lungs at bedtime.   XARELTO 20 MG Tabs tablet Generic drug:  rivaroxaban TAKE 1 TABLET BY MOUTH EVERY DAY What changed:    how much to take  when to take this      Follow-up Information    [facility] Follow up on 11/07/2017.   Specialty:  Cardiology Why:  1:30PM Contact information: [address] [id number] mc [place] [address] [phone]         Allergies  Allergen Reactions  . Diphenhydramine Hcl Hives and Other (See Comments)  Erratic heartbeat  . Flecainide Rash    Consultations:  Electrophysiology   Procedures/Studies: Ct Coronary Morph W/cta Cor W/score W/ca W/cm &/or Wo/cm  Addendum Date: 10/17/2017   ADDENDUM REPORT: 10/17/2017 09:25 EXAM: OVER-READ INTERPRETATION  CT CHEST The following report is an over-read performed by radiologist Dr. Alver Fisher [facility] on 10/17/2017. This over-read does not include interpretation of cardiac or coronary anatomy or pathology. The interpretation by the cardiologist is attached. COMPARISON:  None. FINDINGS: No pathologically enlarged  mediastinal, hilar or axillary lymph nodes. Esophagus is grossly unremarkable. Linear atelectasis or scarring in the lingula. No pleural fluid. Airway is unremarkable. Visualized portions of the liver, spleen and stomach are unremarkable. Degenerative changes in the spine. Portions of the chest are unremarkable. IMPRESSION: Visualized portions of the chest are unremarkable. Electronically Signed   By: Leanna Battles M.D.   On: 10/17/2017 09:25   Result Date: 10/17/2017 CLINICAL DATA:  67 year old female with recurrent atrial flutter with RVR and elevated troponin. EXAM: Cardiac/Coronary  CT TECHNIQUE: The patient was scanned on a [employer]. FINDINGS: A 120 kV prospective scan was triggered in the descending thoracic aorta at 111 HU's. Axial non-contrast 3 mm slices were carried out through the heart. The data set was analyzed on a dedicated work station and scored using the Agatson method. Gantry rotation speed was 250 msecs and collimation was .6 mm. No beta blockade and 0.8 mg of sl NTG was given. The 3D data set was reconstructed in 5% intervals of the 67-82 % of the R-R cycle. Diastolic phases were analyzed on a dedicated work station using MPR, MIP and VRT modes. The patient received 80 cc of contrast. Aorta:  Normal size.  Mild calcifications.  No dissection. Aortic Valve:  Trileaflet.  No calcifications. Coronary Arteries:  Normal coronary origin.  Right dominance. RCA is a large dominant artery that gives rise to PDA and PLVB. There is mild calcified plaque in the proximal portion with associated stenosis 25-50%. Left main is a large artery that gives rise to LAD and LCX arteries. Left main has minimal plaque. LAD is a large vessel that has gives rise to two small diagonal arteries, proximal LAD has long mixed plaque with stenosis 25-50% and possible focal 50-69%. LCX is a non-dominant artery that gives rise to two small OM branches. There is no plaque. Other findings: Normal pulmonary vein  drainage into the left atrium. Normal let atrial appendage without a thrombus. Normal size of the pulmonary artery. IMPRESSION: 1. Coronary calcium score of 106. This was 75 percentile for age and sex matched control. 2. Normal coronary origin with right dominance. 3. Mild non-obstructive CAD. Aggressive risk factor modification is recommended. Electronically Signed: By: Tobias Alexander On: 10/15/2017 13:06   Dg Chest Port 1 View  Result Date: 10/30/2017 CLINICAL DATA:  Palpitations EXAM: PORTABLE CHEST 1 VIEW COMPARISON:  10/14/2017 FINDINGS: Mild cardiomegaly. No confluent opacities, effusions or edema. No acute bony abnormality. IMPRESSION: Cardiomegaly.  No active disease. Electronically Signed   By: Charlett Nose M.D.   On: 10/30/2017 17:46   Dg Chest Portable 1 View  Result Date: 10/14/2017 CLINICAL DATA:  Chest pain. Atrial fibrillation. EXAM: PORTABLE CHEST 1 VIEW COMPARISON:  09/17/2017. FINDINGS: Interval enlarged cardiac silhouette with a mild increase in prominence of the pulmonary vasculature. Clear lungs. No pleural fluid. Unremarkable bones. IMPRESSION: Interval cardiomegaly and mildly progressive pulmonary vascular congestion. Electronically Signed   By: Beckie Salts M.D.   On: 10/14/2017 21:42  Subjective: She feels well.  No palpitations, dyspnea, arm symptoms, chest discomfort.  No confusion, cough, orthopnea, leg swelling.  Discharge Exam: Vitals:   10/31/17 0357 10/31/17 1300  BP: (!) 155/81   Pulse: (!) 46 61  Resp: 18 18  Temp: 97.7 F (36.5 C) 97.9 F (36.6 C)  SpO2: 99% 99%   Vitals:   10/30/17 2056 10/31/17 0356 10/31/17 0357 10/31/17 1300  BP: (!) 147/107  (!) 155/81   Pulse: (!) 135  (!) 46 61  Resp: 16  18 18   Temp: 98.2 F (36.8 C)  97.7 F (36.5 C) 97.9 F (36.6 C)  TempSrc: Oral  Oral Oral  SpO2: 100%  99% 99%  Weight:  95.6 kg      General: Pt is alert, awake, not in acute distress, sitting in a chair. Cardiovascular: Heart rate regular,  rhythm irregular, S1/S2 +, no rubs, no gallops Respiratory: CTA bilaterally, no wheezing, no rhonchi Abdominal: Soft, NT, ND, bowel sounds + Extremities: no edema, no cyanosis    The results of significant diagnostics from this hospitalization (including imaging, microbiology, ancillary and laboratory) are listed below for reference.     Microbiology: No results found for this or any previous visit (from the past 240 hour(s)).   Labs: BNP (last 3 results) No results for input(s): BNP in the last 8760 hours. Basic Metabolic Panel: Recent Labs  Lab 10/30/17 1704  NA 145  K 3.6  CL 107  CO2 28  GLUCOSE 94  BUN 17  CREATININE 1.07*  CALCIUM 9.6   Liver Function Tests: Recent Labs  Lab 10/30/17 1704  AST 27  ALT 23  ALKPHOS 62  BILITOT 0.7  PROT 7.5  ALBUMIN 3.8   No results for input(s): LIPASE, AMYLASE in the last 168 hours. No results for input(s): AMMONIA in the last 168 hours. CBC: Recent Labs  Lab 10/30/17 1704  WBC 9.5  NEUTROABS 6.8  HGB 13.3  HCT 41.6  MCV 84.0  PLT 284   Cardiac Enzymes: No results for input(s): CKTOTAL, CKMB, CKMBINDEX, TROPONINI in the last 168 hours. BNP: Invalid input(s): POCBNP CBG: No results for input(s): GLUCAP in the last 168 hours. D-Dimer No results for input(s): DDIMER in the last 72 hours. Hgb A1c No results for input(s): HGBA1C in the last 72 hours. Lipid Profile No results for input(s): CHOL, HDL, LDLCALC, TRIG, CHOLHDL, LDLDIRECT in the last 72 hours. Thyroid function studies No results for input(s): TSH, T4TOTAL, T3FREE, THYROIDAB in the last 72 hours.  Invalid input(s): FREET3 Anemia work up No results for input(s): VITAMINB12, FOLATE, FERRITIN, TIBC, IRON, RETICCTPCT in the last 72 hours. Urinalysis No results found for: COLORURINE, APPEARANCEUR, LABSPEC, PHURINE, GLUCOSEU, HGBUR, BILIRUBINUR, KETONESUR, PROTEINUR, UROBILINOGEN, NITRITE, LEUKOCYTESUR Sepsis Labs Invalid input(s): PROCALCITONIN,  WBC,   LACTICIDVEN Microbiology No results found for this or any previous visit (from the past 240 hour(s)).   Time coordinating discharge: 40 minutes       SIGNED:   Alberteen Samhristopher P Kharon Hixon, MD  [facility] 10/31/2017, 7:28 PM

## 2017-10-31 NOTE — Progress Notes (Deleted)
PT Cancellation Note  Patient Details Name: Mary Poole MRN: [id number] DOB: 03/04/1950   Cancelled Treatment:    Reason Eval/Treat Not Completed: Other (comment).  Pt states her MD came in to ck surgery wound, removed vac and is now awaiting nsg team to replace it for therapy.  Will try again as time and pt allow.   Mary Poole 10/31/2017, 11:37 AM   Mary Poole, PT MS Acute Rehab Dept. Number: [facility] [phone] and [facility] [phone]

## 2017-10-31 NOTE — Evaluation (Signed)
Physical Therapy Evaluation Patient Details Name: Mary ServeSandra J Krus MRN: [id number] DOB: 11/22/1950 Today's Date: 10/31/2017   History of Present Illness  67 yo female with onset of a-fib with RVR and was admitted, now has alleviated arm numbness and reduced tachycardia.  PMHx:  PAF, HTN, migraine,   Clinical Impression  Pt was seen for evaluation of mobility and did note she is able to use BSC with safety after being kept on purwick in bed.  Mainly is being protected due to HR but with walk and transfers got briefly to 138 and down to 97 b/min.  Follow acutely for progression of gait and strengthening, and will send home with no PT needed formally afterward.    Follow Up Recommendations No PT follow up    Equipment Recommendations  None recommended by PT    Recommendations for Other Services       Precautions / Restrictions Precautions Precautions: Fall Restrictions Weight Bearing Restrictions: No      Mobility  Bed Mobility Overal bed mobility: Modified Independent                Transfers Overall transfer level: Modified independent Equipment used: None             General transfer comment: supervised her transitions to the [facility]BSC and the chair in her room  Ambulation/Gait Ambulation/Gait assistance: Min guard Gait Distance (Feet): 8 Feet Assistive device: None Gait Pattern/deviations: Step-through pattern;Decreased stride length;Wide base of support Gait velocity: reduced   General Gait Details: pt able to walk but supervised due to high HR and ck of O2 sats  Stairs            Wheelchair Mobility    Modified Rankin (Stroke Patients Only)       Balance Overall balance assessment: Needs assistance Sitting-balance support: Feet supported Sitting balance-Leahy Scale: Good     Standing balance support: No upper extremity supported Standing balance-Leahy Scale: Fair                               Pertinent Vitals/Pain Pain  Assessment: No/denies pain    Home Living Family/patient expects to be discharged to:: Private residence Living Arrangements: Spouse/significant other Available Help at Discharge: Family;Available 24 hours/day Type of Home: House Home Access: Level entry     Home Layout: One level Home Equipment: Cane - single point Additional Comments: Pt does not typically need a SPC but has been in hosp recently with cardioversion in Aug.2019.      Prior Function Level of Independence: Independent         Comments: able to walk in her room with no AD     Hand Dominance   Dominant Hand: Right    Extremity/Trunk Assessment   Upper Extremity Assessment Upper Extremity Assessment: Overall WFL for tasks assessed    Lower Extremity Assessment Lower Extremity Assessment: Overall WFL for tasks assessed    Cervical / Trunk Assessment Cervical / Trunk Assessment: Normal  Communication   Communication: No difficulties  Cognition Arousal/Alertness: Awake/alert Behavior During Therapy: WFL for tasks assessed/performed Overall Cognitive Status: Within Functional Limits for tasks assessed                                        General Comments General comments (skin integrity, edema, etc.): pt has a limited endurance before HR  becomes higher, and today got up to 138 and sat to rest in her chair.  Pt is better sitting due to discomfort sitting in bed.    Exercises     Assessment/Plan    PT Assessment Patient needs continued PT services  PT Problem List Decreased strength;Decreased range of motion;Decreased activity tolerance;Decreased mobility;Cardiopulmonary status limiting activity;Decreased safety awareness;Obesity;Decreased skin integrity(IV's in arms)       PT Treatment Interventions DME instruction;Gait training;Functional mobility training;Therapeutic activities;Therapeutic exercise;Balance training;Neuromuscular re-education;Patient/family education    PT Goals  (Current goals can be found in the Care Plan section)  Acute Rehab PT Goals Patient Stated Goal: to get back to work PT Goal Formulation: With patient Time For Goal Achievement: 11/07/17 Potential to Achieve Goals: Good    Frequency Min 3X/week   Barriers to discharge   home with family but pt was working before she came to hospital    Co-evaluation               AM-PAC PT "6 Clicks" Daily Activity  Outcome Measure Difficulty turning over in bed (including adjusting bedclothes, sheets and blankets)?: A Little Difficulty moving from lying on back to sitting on the side of the bed? : A Little Difficulty sitting down on and standing up from a chair with arms (e.g., wheelchair, bedside commode, etc,.)?: A Lot Help needed moving to and from a bed to chair (including a wheelchair)?: A Little Help needed walking in hospital room?: A Little Help needed climbing 3-5 steps with a railing? : A Little 6 Click Score: 17    End of Session Equipment Utilized During Treatment: Gait belt Activity Tolerance: Patient tolerated treatment well;Patient limited by fatigue Patient left: in chair;with call bell/phone within reach;Other (comment)(on telemetry and with ck of IV line for safety) Nurse Communication: Mobility status PT Visit Diagnosis: Unsteadiness on feet (R26.81);Muscle weakness (generalized) (M62.81);Difficulty in walking, not elsewhere classified (R26.2)    Time: 1610-9604 PT Time Calculation (min) (ACUTE ONLY): 20 min   Charges:   PT Evaluation $PT Eval Moderate Complexity: 1 Mod         Ivar Drape 10/31/2017, 12:54 PM   Samul Dada, PT MS Acute Rehab Dept. Number: [facility] [id number] and [facility] [phone]

## 2017-10-31 NOTE — Progress Notes (Signed)
Patient has refused breathing treatment. Stated she did not know why it was ordered for her.  She did state that she uses a CPAP at home several hours at night.  She does however use the nasal pillows and does not want to use anything else while here.  I told her she could have someone bring them from home if she would like to use the CPAP here, but as of now, there is no order for one yet.

## 2017-11-01 ENCOUNTER — Encounter: Payer: Self-pay | Admitting: Cardiology

## 2017-11-01 ENCOUNTER — Emergency Department (HOSPITAL_COMMUNITY): Payer: Medicare Other

## 2017-11-01 ENCOUNTER — Emergency Department (HOSPITAL_COMMUNITY)
Admission: EM | Admit: 2017-11-01 | Discharge: 2017-11-01 | Disposition: A | Discharge status: Home / Self Care | Payer: Medicare Other | Attending: Emergency Medicine | Admitting: Emergency Medicine

## 2017-11-01 DIAGNOSIS — R002 Palpitations: Secondary | ICD-10-CM | POA: Diagnosis present

## 2017-11-01 DIAGNOSIS — Z79899 Other long term (current) drug therapy: Secondary | ICD-10-CM | POA: Diagnosis not present

## 2017-11-01 DIAGNOSIS — I10 Essential (primary) hypertension: Secondary | ICD-10-CM | POA: Insufficient documentation

## 2017-11-01 DIAGNOSIS — Z8679 Personal history of other diseases of the circulatory system: Secondary | ICD-10-CM | POA: Diagnosis not present

## 2017-11-01 DIAGNOSIS — Z87891 Personal history of nicotine dependence: Secondary | ICD-10-CM | POA: Diagnosis not present

## 2017-11-01 DIAGNOSIS — I483 Typical atrial flutter: Secondary | ICD-10-CM | POA: Diagnosis not present

## 2017-11-01 DIAGNOSIS — R000 Tachycardia, unspecified: Secondary | ICD-10-CM | POA: Diagnosis not present

## 2017-11-01 DIAGNOSIS — R0602 Shortness of breath: Secondary | ICD-10-CM | POA: Diagnosis not present

## 2017-11-01 DIAGNOSIS — I4892 Unspecified atrial flutter: Secondary | ICD-10-CM

## 2017-11-01 DIAGNOSIS — I4891 Unspecified atrial fibrillation: Secondary | ICD-10-CM

## 2017-11-01 DIAGNOSIS — Z7901 Long term (current) use of anticoagulants: Secondary | ICD-10-CM | POA: Insufficient documentation

## 2017-11-01 LAB — CBC
HEMATOCRIT: 40.9 % (ref 36.0–46.0)
Hemoglobin: 12.7 g/dL (ref 12.0–15.0)
MCH: 26.4 pg (ref 26.0–34.0)
MCHC: 31.1 g/dL (ref 30.0–36.0)
MCV: 85 fL (ref 78.0–100.0)
PLATELETS: 268 10*3/uL (ref 150–400)
RBC: 4.81 MIL/uL (ref 3.87–5.11)
RDW: 16 % — ABNORMAL HIGH (ref 11.5–15.5)
WBC: 8.8 10*3/uL (ref 4.0–10.5)

## 2017-11-01 LAB — BASIC METABOLIC PANEL
Anion gap: 11 (ref 5–15)
BUN: 17 mg/dL (ref 8–23)
CHLORIDE: 105 mmol/L (ref 98–111)
CO2: 24 mmol/L (ref 22–32)
CREATININE: 1.13 mg/dL — AB (ref 0.44–1.00)
Calcium: 9.6 mg/dL (ref 8.9–10.3)
GFR calc non Af Amer: 49 mL/min — ABNORMAL LOW (ref >60)
GFR, EST AFRICAN AMERICAN: 57 mL/min — AB (ref >60)
Glucose, Bld: 112 mg/dL — ABNORMAL HIGH (ref 70–99)
POTASSIUM: 3.9 mmol/L (ref 3.5–5.1)
Sodium: 140 mmol/L (ref 135–145)

## 2017-11-01 LAB — I-STAT TROPONIN, ED: Troponin i, poc: 0.03 ng/mL (ref 0.00–0.08)

## 2017-11-01 MED ORDER — DILTIAZEM HCL 25 MG/5ML IV SOLN
20.0000 mg | Freq: Once | INTRAVENOUS | Status: AC
  Administered 2017-11-01: 20 mg via INTRAVENOUS

## 2017-11-01 MED ORDER — DILTIAZEM HCL-DEXTROSE 100-5 MG/100ML-% IV SOLN (PREMIX)
5.0000 mg/h | INTRAVENOUS | Status: DC
  Administered 2017-11-01: 5 mg/h via INTRAVENOUS
  Filled 2017-11-01: qty 100

## 2017-11-01 MED ORDER — ETOMIDATE 2 MG/ML IV SOLN
INTRAVENOUS | Status: AC | PRN
  Administered 2017-11-01: 9 mg via INTRAVENOUS

## 2017-11-01 MED ORDER — ETOMIDATE 2 MG/ML IV SOLN
10.0000 mg | Freq: Once | INTRAVENOUS | Status: DC
  Filled 2017-11-01: qty 10

## 2017-11-01 MED ORDER — DILTIAZEM LOAD VIA INFUSION
20.0000 mg | Freq: Once | INTRAVENOUS | Status: AC
  Administered 2017-11-01: 20 mg via INTRAVENOUS
  Filled 2017-11-01: qty 20

## 2017-11-01 NOTE — Telephone Encounter (Signed)
Pt is currently in the E.D. Physician to update me w/ plan

## 2017-11-01 NOTE — Sedation Documentation (Addendum)
Pt unconscious, charge and shock delivered at 120.

## 2017-11-01 NOTE — ED Notes (Signed)
Pt given Mal[place] sandwich and ice water per EDP, DR. Freida BusmanALLEN.

## 2017-11-01 NOTE — Consult Note (Signed)
ELECTROPHYSIOLOGY CONSULT NOTE    Primary Care Physician: Everrett CoombeMatthews, Cody, DO Referring Physician:  Dr Verner MouldAllren Admit Date: 11/01/2017  Primary EP: Elberta Fortisamnitz  Reason for consultation:  Atrial flutter  Carolynn ServeSandra J Poole is a 67 y.o. female with a h/o atrial flutter, obesity, and sleep apnea.  She was recently discharged with atrial flutter after being evaluated by Dr Elberta Fortisamnitz.  There is plan for outpatient ablation.  Unfortunately, today, she developed recurrent tachypalpitations with lightheadedness.  She presents with recurrent typical atrial flutter.  She reports compliance with xarelto without interruption.  She is also on amiodarone.  Today, she denies symptoms of chest pain, shortness of breath, orthopnea, PND, lower extremity edema, presyncope, syncope, or neurologic sequela. The patient is tolerating medications without difficulties and is otherwise without complaint today.   Past Medical History:  Diagnosis Date  . GERD (gastroesophageal reflux disease)   . H/O atrial flutter   . Hypertension   . Migraine   . Sleep apnea    Past Surgical History:  Procedure Laterality Date  . COLONOSCOPY N/A 02/24/2017   Procedure: COLONOSCOPY;  Surgeon: Jeani HawkingHung, Patrick, MD;  [facility];  Service: Gastroenterology;  Laterality: N/A;  . ENTEROSCOPY N/A 02/24/2017   Procedure: ENTEROSCOPY;  Surgeon: Jeani HawkingHung, Patrick, MD;  [facility];  Service: Gastroenterology;  Laterality: N/A;  . HERNIA REPAIR    . WISDOM TOOTH EXTRACTION      . etomidate  10 mg Intravenous Once   . diltiazem (CARDIZEM) infusion 10 mg/hr (11/01/17 1727)    Allergies  Allergen Reactions  . Diphenhydramine Hcl Hives and Other (See Comments)    Erratic heartbeat  . Flecainide Rash    Social History   Socioeconomic History  . Marital status: Married    Spouse name: Not on file  . Number of children: Not on file  . Years of education: Not on file  . Highest education level: Not on file  Occupational  History  . Not on file  Social Needs  . Financial resource strain: Not hard at all  . Food insecurity:    Worry: Never true    Inability: Never true  . Transportation needs:    Medical: No    Non-medical: No  Tobacco Use  . Smoking status: Former Games developermoker  . Smokeless tobacco: Never Used  Substance and Sexual Activity  . Alcohol use: No  . Drug use: No  . Sexual activity: Not on file  Lifestyle  . Physical activity:    Days per week: Not on file    Minutes per session: Not on file  . Stress: Not on file  Relationships  . Social connections:    Talks on phone: Not on file    Gets together: Not on file    Attends religious service: Not on file    Active member of club or organization: Not on file    Attends meetings of clubs or organizations: Not on file    Relationship status: Not on file  . Intimate partner violence:    Fear of current or ex partner: Not on file    Emotionally abused: Not on file    Physically abused: Not on file    Forced sexual activity: Not on file  Other Topics Concern  . Not on file  Social History Narrative  . Not on file    Family History  Problem Relation Age of Onset  . CAD Father   . AAA (abdominal aortic aneurysm) Sister   . Thyroid disease  Sister     ROS- All systems are reviewed and negative except as per the HPI above  Physical Exam: Telemetry:  Atrial flutter Vitals:   11/01/17 1657 11/01/17 1715 11/01/17 1730 11/01/17 1800  BP:  (!) 121/92 (!) 115/97 118/70  Pulse: (!) 132 (!) 129 (!) 131 80  Resp: 18 (!) 22 19 14   Temp:      TempSrc:      SpO2: 96% 100% 99% 100%    GEN- The patient is voerweight appearing, alert and oriented x 3 today.   Head- normocephalic, atraumatic Eyes-  Sclera clear, conjunctiva pink Ears- hearing intact Oropharynx- clear Neck- supple, no JVP Lungs- Clear to ausculation bilaterally, normal work of breathing Heart- irregular rate and rhythm, no murmurs, rubs or gallops, PMI not laterally  displaced GI- soft, NT, ND, + BS Extremities- no clubbing, cyanosis, or edema MS- no significant deformity or atrophy Skin- no rash or lesion Psych- euthymic mood, full affect Neuro- strength and sensation are intact  EKG- typical atrial flutter Labs:   Lab Results  Component Value Date   WBC 8.8 11/01/2017   HGB 12.7 11/01/2017   HCT 40.9 11/01/2017   MCV 85.0 11/01/2017   PLT 268 11/01/2017    Recent Labs  Lab 10/30/17 1704 11/01/17 1443  NA 145 140  K 3.6 3.9  CL 107 105  CO2 28 24  BUN 17 17  CREATININE 1.07* 1.13*  CALCIUM 9.6 9.6  PROT 7.5  --   BILITOT 0.7  --   ALKPHOS 62  --   ALT 23  --   AST 27  --   GLUCOSE 94 112*   Lab Results  Component Value Date   TROPONINI 0.23 (HH) 10/15/2017   No results found for: CHOL No results found for: HDL No results found for: LDLCALC No results found for: TRIG No results found for: CHOLHDL No results found for: LDLDIRECT      ASSESSMENT AND PLAN:   1. Typical atrial flutter I have spoken with Drs [name] and Freida Busman.  The patient is well known to Dr [name] and he is planning ablation next week.  He advises cardioversion in the ER with outpatient follow-up in the AF clinic. Continue xarelto, amiodarone, coreg, and diltiazem at currently doses.  If successful cardioversion, ok to discharge from ED.   Hillis Range, MD 11/01/2017  6:06 PM

## 2017-11-01 NOTE — Telephone Encounter (Signed)
New Message:   Patient is requesting to schedule for an ablation

## 2017-11-01 NOTE — Sedation Documentation (Signed)
Pt on cardiac monitor and zoll, CO2 monitor, ambu bag set up, respiratory at bedside, suction available

## 2017-11-01 NOTE — Telephone Encounter (Signed)
This encounter was created in error - please disregard.

## 2017-11-01 NOTE — ED Notes (Signed)
11 mg of etomidate wasted with [facility].

## 2017-11-01 NOTE — ED Notes (Signed)
MD (Dr. Freida BusmanAllen) gave verbal order to bolus 20 mg of Cardizem from the bag.

## 2017-11-01 NOTE — ED Provider Notes (Signed)
[facility] EMERGENCY DEPARTMENT Provider Note   CSN: [id number] Arrival date & time: 11/01/17  1414     History   Chief Complaint Chief Complaint  Patient presents with  . Atrial Fibrillation    HPI Mary Poole is a 67 y.o. female.  67 year old female with history of A. fib and a flutter presents with palpitations with associated dizziness.  No associated chest pain or chest pressure.  She has not been short of breath.  Patient discharged from the hospital yesterday and has been scheduled for an ablation next month.  She currently takes Cardizem, amiodarone, Coreg for A. fib and has been compliant.  Denies any history of severe thyroid issues.  Symptoms started about 6 hours prior to arrival.  No new treatments used for this prior to arrival.  Nothing makes her symptoms better.     Past Medical History:  Diagnosis Date  . GERD (gastroesophageal reflux disease)   . H/O atrial flutter   . Hypertension   . Migraine   . Sleep apnea     Patient Active Problem List   Diagnosis Date Noted  . Atrial fibrillation with RVR (HCC) 10/30/2017  . Atrial flutter with rapid ventricular response (HCC) 10/15/2017  . Sinus bradycardia 09/18/2017  . OSA (obstructive sleep apnea)   . Thyroid disease 07/20/2017  . Atrial flutter (HCC) 06/09/2017  . Iron deficiency anemia due to chronic blood loss   . Normocytic anemia 02/22/2017  . Accessory atrioventricular pathway 09/17/2016  . On continuous oral anticoagulation 09/17/2016  . Snoring 09/17/2016  . Paroxysmal atrial fibrillation (HCC) 09/16/2016  . Essential hypertension 07/19/2016  . Sensorineural hearing loss (SNHL), bilateral 11/20/2015  . Laryngopharyngeal reflux (LPR) 11/20/2015  . Perennial allergic rhinitis 11/20/2015  . Tinnitus, bilateral 11/20/2015  . GERD (gastroesophageal reflux disease) 07/15/2011    Past Surgical History:  Procedure Laterality Date  . COLONOSCOPY N/A 02/24/2017   Procedure:  COLONOSCOPY;  Surgeon: Jeani Hawking, MD;  Location: [facility] ENDOSCOPY;  Service: Gastroenterology;  Laterality: N/A;  . ENTEROSCOPY N/A 02/24/2017   Procedure: ENTEROSCOPY;  Surgeon: Jeani Hawking, MD;  Location: [facility] ENDOSCOPY;  Service: Gastroenterology;  Laterality: N/A;  . HERNIA REPAIR    . WISDOM TOOTH EXTRACTION       OB History   None      Home Medications    Prior to Admission medications   Medication Sig Start Date End Date Taking? Authorizing Provider  amiodarone (PACERONE) 400 MG tablet Take 1 tablet (400 mg total) by mouth 2 (two) times daily. 10/31/17   Danford, Earl Lites, MD  carvedilol (COREG) 25 MG tablet Take 1 tablet (25 mg total) by mouth 2 (two) times daily. 10/05/17 01/03/18  Camnitz, Andree Coss, MD  diltiazem (CARDIZEM CD) 180 MG 24 hr capsule Take 1 capsule (180 mg total) by mouth daily. 11/01/17   Danford, Earl Lites, MD  Multiple Vitamin (MULTIVITAMIN) LIQD Take 30 mLs by mouth daily.    [provider]  omeprazole (PRILOSEC) 20 MG capsule Take 1 capsule (20 mg total) by mouth daily. 07/28/17   Everrett Coombe, DO  PRESCRIPTION MEDICATION Inhale into the lungs at bedtime.    [provider]  XARELTO 20 MG TABS tablet TAKE 1 TABLET BY MOUTH EVERY DAY Patient taking differently: Take 20 mg by mouth at bedtime.  06/20/17   Lyn Records, MD    Family History Family History  Problem Relation Age of Onset  . CAD Father   . AAA (abdominal aortic  aneurysm) Sister   . Thyroid disease Sister     Social History Social History   Tobacco Use  . Smoking status: Former Games developermoker  . Smokeless tobacco: Never Used  Substance Use Topics  . Alcohol use: No  . Drug use: No     Allergies   Diphenhydramine hcl and Flecainide   Review of Systems Review of Systems  All other systems reviewed and are negative.    Physical Exam Updated Vital Signs BP (!) 133/99   Pulse (!) 132   Temp (!) 97.2 F (36.2 C) (Oral)   Resp 18   SpO2 100%    Physical Exam  Constitutional: She is oriented to person, place, and time. She appears well-developed and well-nourished.  Non-toxic appearance. No distress.  HENT:  Head: Normocephalic and atraumatic.  Eyes: Pupils are equal, round, and reactive to light. Conjunctivae, EOM and lids are normal.  Neck: Normal range of motion. Neck supple. No tracheal deviation present. No thyroid mass present.  Cardiovascular: Normal heart sounds. An irregularly irregular rhythm present. Tachycardia present. Exam reveals no gallop.  No murmur heard. Pulmonary/Chest: Effort normal and breath sounds normal. No stridor. No respiratory distress. She has no decreased breath sounds. She has no wheezes. She has no rhonchi. She has no rales.  Abdominal: Soft. Normal appearance and bowel sounds are normal. She exhibits no distension. There is no tenderness. There is no rebound and no CVA tenderness.  Musculoskeletal: Normal range of motion. She exhibits no edema or tenderness.  Neurological: She is alert and oriented to person, place, and time. She has normal strength. No cranial nerve deficit or sensory deficit. GCS eye subscore is 4. GCS verbal subscore is 5. GCS motor subscore is 6.  Skin: Skin is warm and dry. No abrasion and no rash noted.  Psychiatric: She has a normal mood and affect. Her speech is normal and behavior is normal.  Nursing note and vitals reviewed.    ED Treatments / Results  Labs (all labs ordered are listed, but only abnormal results are displayed) Labs Reviewed  BASIC METABOLIC PANEL - Abnormal; Notable for the following components:      Result Value   Glucose, Bld 112 (*)    Creatinine, Ser 1.13 (*)    GFR calc non Af Amer 49 (*)    GFR calc Af Amer 57 (*)    All other components within normal limits  CBC - Abnormal; Notable for the following components:   RDW 16.0 (*)    All other components within normal limits  I-STAT TROPONIN, ED    EKG EKG  Interpretation  Date/Time:  Tuesday November 01 2017 14:23:56 EDT Ventricular Rate:  133 PR Interval:  82 QRS Duration: 164 QT Interval:  316 QTC Calculation: 470 R Axis:   48 Text Interpretation:  Sinus tachycardia with short PR Non-specific intra-ventricular conduction block Abnormal ECG Confirmed by Lorre NickAllen, Faylene Allerton ([id number]) on 11/01/2017 4:06:24 PM   Radiology Dg Chest Port 1 View  Result Date: 10/30/2017 CLINICAL DATA:  Palpitations EXAM: PORTABLE CHEST 1 VIEW COMPARISON:  10/14/2017 FINDINGS: Mild cardiomegaly. No confluent opacities, effusions or edema. No acute bony abnormality. IMPRESSION: Cardiomegaly.  No active disease. Electronically Signed   By: Charlett NoseKevin  Dover M.D.   On: 10/30/2017 17:46    Procedures .Cardioversion Date/Time: 11/01/2017 9:02 PM Performed by: Lorre NickAllen, Naleigha Raimondi, MD Authorized by: Lorre NickAllen, Reynaldo Rossman, MD   Consent:    Consent obtained:  Written   Consent given by:  Patient   Risks discussed:  Death and induced arrhythmia   Alternatives discussed:  Rate-control medication Pre-procedure details:    Cardioversion basis:  Emergent   Rhythm:  Atrial flutter   Electrode placement:  Anterior-posterior Patient sedated: Yes. Refer to sedation procedure documentation for details of sedation.  Attempt one:    Cardioversion mode:  Synchronous   Waveform:  Biphasic   Shock (Joules):  120   Shock outcome:  Conversion to asystole Post-procedure details:    Patient status:  Awake   Patient tolerance of procedure:  Tolerated well, no immediate complications .Sedation Date/Time: 11/01/2017 9:03 PM Performed by: Lorre Nick, MD Authorized by: Lorre Nick, MD   Consent:    Consent obtained:  Written   Consent given by:  Patient   Risks discussed:  Dysrhythmia   Alternatives discussed:  Analgesia without sedation Universal protocol:    Immediately prior to procedure a time out was called: yes     Patient identity confirmation method:  Arm band Indications:     Procedure performed:  Cardioversion   Procedure necessitating sedation performed by:  Physician performing sedation   Intended level of sedation:  Deep Pre-sedation assessment:    Time since last food or drink:  6 hours   ASA classification: class 3 - patient with severe systemic disease     Neck mobility: normal     Mouth opening:  3 or more finger widths   Mallampati score:  I - soft palate, uvula, fauces, pillars visible   Pre-sedation assessments completed and reviewed: airway patency     Pre-sedation assessment completed:  11/01/2017 8:00 PM Immediate pre-procedure details:    Reassessment: Patient reassessed immediately prior to procedure     Reviewed: vital signs     Verified: bag valve mask available   Procedure details (see MAR for exact dosages):    Preoxygenation:  Room air   Sedation:  Etomidate   Intra-procedure monitoring:  Blood pressure monitoring, cardiac monitor, continuous pulse oximetry, continuous capnometry, frequent LOC assessments and frequent vital sign checks   Intra-procedure events: none     Intra-procedure management:  Airway repositioning   Total Provider sedation time (minutes):  20 Post-procedure details:    Post-sedation assessment completed:  11/01/2017 9:05 PM   Attendance: Constant attendance by certified staff until patient recovered     Recovery: Patient returned to pre-procedure baseline     Patient is stable for discharge or admission: yes     Patient tolerance:  Tolerated well, no immediate complications   (including critical care time)  Medications Ordered in ED Medications  diltiazem (CARDIZEM) 1 mg/mL load via infusion 20 mg (has no administration in time range)    And  diltiazem (CARDIZEM) 100 mg in dextrose 5% (1 mg/mL) infusion (has no administration in time range)     Initial Impression / Assessment and Plan / ED Course  I have reviewed the triage vital signs and the nursing notes.  Pertinent labs & imaging results that  were available during my care of the patient were reviewed by me and considered in my medical decision making (see chart for details).    5:50 PM Patient had been started on Cardizem drip after receiving a 20 mg Cardizem bolus.  Patient reassessed and remains in what appears to be atrial flutter.  Re-bolused with Cardizem.  Awaiting cardiology consultation.  9:05 PM Patient seen by cardiology, Dr. Johney Frame, who recommends patient have cardioversion.  This was performed and she is back in sinus rhythm.  She was monitored for  several hours afterwards and has remained in sinus rhythm.  Follow-up has been scheduled   CRITICAL CARE Performed by: Toy Baker Total critical care time: 60 minutes Critical care time was exclusive of separately billable procedures and treating other patients. Critical care was necessary to treat or prevent imminent or life-threatening deterioration. Critical care was time spent personally by me on the following activities: development of treatment plan with patient and/or surrogate as well as nursing, discussions with consultants, evaluation of patient's response to treatment, examination of patient, obtaining history from patient or surrogate, ordering and performing treatments and interventions, ordering and review of laboratory studies, ordering and review of radiographic studies, pulse oximetry and re-evaluation of patient's condition.   Final Clinical Impressions(s) / ED Diagnoses   Final diagnoses:  None    ED Discharge Orders    None       Lorre Nick, MD 11/01/17 2106

## 2017-11-01 NOTE — ED Triage Notes (Signed)
Pt was just discharged yesterday after being admitted for afib. Pt states she was feeling good and then all of a sudden became dizzy and her heart was racing- denies any pain or sob.

## 2017-11-01 NOTE — Sedation Documentation (Signed)
Pt A&Ox4 talking to family. Pt is sinus brady at 53

## 2017-11-01 NOTE — Sedation Documentation (Signed)
Pt converted to SR.

## 2017-11-01 NOTE — ED Notes (Signed)
Patient verbalizes understanding of discharge instructions. Opportunity for questioning and answers were provided. Armband removed by staff, pt discharged from ED in wheelchair.  

## 2017-11-01 NOTE — Telephone Encounter (Signed)
Mary Poole, Mary Poole at 11/01/2017 12:28 PM   Status: Signed    New Message:   Patient is requesting to schedule for an ablation

## 2017-11-03 NOTE — Telephone Encounter (Signed)
Procedure scheduled for 10/2. Instructions reviewed w/ patient. Post ablation follow appts made. Patient verbalized understanding and agreeable to plan.

## 2017-11-07 ENCOUNTER — Ambulatory Visit (HOSPITAL_COMMUNITY): Payer: Medicare Other | Admitting: Nurse Practitioner

## 2017-11-08 NOTE — Anesthesia Preprocedure Evaluation (Addendum)
Anesthesia Evaluation  Patient identified by MRN, date of birth, ID band Patient awake    Reviewed: Allergy & Precautions, NPO status , Patient's Chart, lab work & pertinent test results, reviewed documented beta blocker date and time   History of Anesthesia Complications Negative for: history of anesthetic complications  Airway Mallampati: II  TM Distance: >3 FB Neck ROM: Full    Dental  (+) Partial Upper, Dental Advisory Given, Missing Partial out:   Pulmonary sleep apnea and Continuous Positive Airway Pressure Ventilation , former smoker,    breath sounds clear to auscultation       Cardiovascular hypertension, Pt. on medications and Pt. on home beta blockers (-) angina+ dysrhythmias Atrial Fibrillation  Rhythm:Regular Rate:Bradycardia  8/19 ECHO: EF 55-60%, mod MR, mild TR, mod pulm HTN '18 STRESS: normal perfusion   Neuro/Psych  Headaches,    GI/Hepatic Neg liver ROS, GERD  Medicated and Controlled,  Endo/Other  Morbid obesity  Renal/GU negative Renal ROS     Musculoskeletal   Abdominal (+) + obese,   Peds  Hematology negative hematology ROS (+) xarelto   Anesthesia Other Findings   Reproductive/Obstetrics                           Anesthesia Physical Anesthesia Plan  ASA: III  Anesthesia Plan: General   Post-op Pain Management:    Induction: Intravenous  PONV Risk Score and Plan: 3 and Ondansetron and Dexamethasone  Airway Management Planned: Oral ETT  Additional Equipment:   Intra-op Plan:   Post-operative Plan: Extubation in OR  Informed Consent: I have reviewed the patients History and Physical, chart, labs and discussed the procedure including the risks, benefits and alternatives for the proposed anesthesia with the patient or authorized representative who has indicated his/her understanding and acceptance.   Dental advisory given  Plan Discussed with: CRNA and  Surgeon  Anesthesia Plan Comments: (Plan routine monitors, GETA)        Anesthesia Quick Evaluation

## 2017-11-09 ENCOUNTER — Encounter (HOSPITAL_COMMUNITY)
Admission: RE | Disposition: A | Discharge status: Home / Self Care | Payer: Self-pay | Source: Ambulatory Visit | Attending: Cardiology

## 2017-11-09 ENCOUNTER — Ambulatory Visit (HOSPITAL_COMMUNITY)
Admission: RE | Admit: 2017-11-09 | Discharge: 2017-11-09 | Disposition: A | Discharge status: Home / Self Care | Payer: Medicare Other | Source: Ambulatory Visit | Attending: Cardiology | Admitting: Cardiology

## 2017-11-09 ENCOUNTER — Ambulatory Visit (HOSPITAL_COMMUNITY): Payer: Medicare Other | Admitting: Anesthesiology

## 2017-11-09 ENCOUNTER — Other Ambulatory Visit: Payer: Self-pay

## 2017-11-09 ENCOUNTER — Encounter (HOSPITAL_COMMUNITY): Payer: Self-pay | Admitting: Certified Registered Nurse Anesthetist

## 2017-11-09 DIAGNOSIS — Z87891 Personal history of nicotine dependence: Secondary | ICD-10-CM | POA: Insufficient documentation

## 2017-11-09 DIAGNOSIS — Z79899 Other long term (current) drug therapy: Secondary | ICD-10-CM | POA: Diagnosis not present

## 2017-11-09 DIAGNOSIS — Z7901 Long term (current) use of anticoagulants: Secondary | ICD-10-CM | POA: Insufficient documentation

## 2017-11-09 DIAGNOSIS — K219 Gastro-esophageal reflux disease without esophagitis: Secondary | ICD-10-CM | POA: Insufficient documentation

## 2017-11-09 DIAGNOSIS — I10 Essential (primary) hypertension: Secondary | ICD-10-CM | POA: Diagnosis not present

## 2017-11-09 DIAGNOSIS — G4733 Obstructive sleep apnea (adult) (pediatric): Secondary | ICD-10-CM | POA: Diagnosis not present

## 2017-11-09 DIAGNOSIS — G4730 Sleep apnea, unspecified: Secondary | ICD-10-CM | POA: Insufficient documentation

## 2017-11-09 DIAGNOSIS — I4892 Unspecified atrial flutter: Secondary | ICD-10-CM | POA: Diagnosis not present

## 2017-11-09 DIAGNOSIS — Z9889 Other specified postprocedural states: Secondary | ICD-10-CM | POA: Insufficient documentation

## 2017-11-09 DIAGNOSIS — I4819 Other persistent atrial fibrillation: Secondary | ICD-10-CM | POA: Diagnosis present

## 2017-11-09 DIAGNOSIS — H905 Unspecified sensorineural hearing loss: Secondary | ICD-10-CM | POA: Diagnosis not present

## 2017-11-09 DIAGNOSIS — I483 Typical atrial flutter: Secondary | ICD-10-CM | POA: Diagnosis not present

## 2017-11-09 DIAGNOSIS — E079 Disorder of thyroid, unspecified: Secondary | ICD-10-CM | POA: Diagnosis not present

## 2017-11-09 DIAGNOSIS — Z6835 Body mass index (BMI) 35.0-35.9, adult: Secondary | ICD-10-CM | POA: Diagnosis not present

## 2017-11-09 DIAGNOSIS — I4891 Unspecified atrial fibrillation: Secondary | ICD-10-CM | POA: Diagnosis not present

## 2017-11-09 HISTORY — DX: Anxiety disorder, unspecified: F41.9

## 2017-11-09 HISTORY — DX: Unspecified atrial fibrillation: I48.91

## 2017-11-09 HISTORY — PX: ATRIAL FIBRILLATION ABLATION: EP1191

## 2017-11-09 HISTORY — DX: Other complications of anesthesia, initial encounter: T88.59XA

## 2017-11-09 HISTORY — DX: Personal history of other medical treatment: Z92.89

## 2017-11-09 HISTORY — DX: Dependence on other enabling machines and devices: Z99.89

## 2017-11-09 HISTORY — DX: Obstructive sleep apnea (adult) (pediatric): G47.33

## 2017-11-09 HISTORY — DX: Adverse effect of unspecified anesthetic, initial encounter: T41.45XA

## 2017-11-09 LAB — POCT ACTIVATED CLOTTING TIME
ACTIVATED CLOTTING TIME: 351 s
Activated Clotting Time: 313 seconds
Activated Clotting Time: 334 seconds
Activated Clotting Time: 142 seconds

## 2017-11-09 SURGERY — ATRIAL FIBRILLATION ABLATION
Anesthesia: General

## 2017-11-09 MED ORDER — MIDAZOLAM HCL 5 MG/5ML IJ SOLN
INTRAMUSCULAR | Status: DC | PRN
  Administered 2017-11-09: 2 mg via INTRAVENOUS

## 2017-11-09 MED ORDER — HEPARIN (PORCINE) IN NACL 1000-0.9 UT/500ML-% IV SOLN
INTRAVENOUS | Status: DC | PRN
  Administered 2017-11-09 (×5): 500 mL

## 2017-11-09 MED ORDER — HEPARIN (PORCINE) IN NACL 1000-0.9 UT/500ML-% IV SOLN
INTRAVENOUS | Status: AC
  Filled 2017-11-09: qty 500

## 2017-11-09 MED ORDER — DOBUTAMINE IN D5W 4-5 MG/ML-% IV SOLN
INTRAVENOUS | Status: AC
  Filled 2017-11-09: qty 250

## 2017-11-09 MED ORDER — SUGAMMADEX SODIUM 200 MG/2ML IV SOLN
INTRAVENOUS | Status: DC | PRN
  Administered 2017-11-09: 191.4 mg via INTRAVENOUS

## 2017-11-09 MED ORDER — HEPARIN SODIUM (PORCINE) 1000 UNIT/ML IJ SOLN
INTRAMUSCULAR | Status: DC | PRN
  Administered 2017-11-09: 1000 [IU] via INTRAVENOUS

## 2017-11-09 MED ORDER — HEPARIN SODIUM (PORCINE) 1000 UNIT/ML IJ SOLN
INTRAMUSCULAR | Status: AC
  Filled 2017-11-09: qty 1

## 2017-11-09 MED ORDER — AMIODARONE HCL 200 MG PO TABS
200.0000 mg | ORAL_TABLET | Freq: Every day | ORAL | Status: DC
  Filled 2017-11-09: qty 1

## 2017-11-09 MED ORDER — ONDANSETRON HCL 4 MG/2ML IJ SOLN
INTRAMUSCULAR | Status: DC | PRN
  Administered 2017-11-09: 4 mg via INTRAVENOUS

## 2017-11-09 MED ORDER — DOBUTAMINE IN D5W 4-5 MG/ML-% IV SOLN
INTRAVENOUS | Status: DC | PRN
  Administered 2017-11-09: 20 ug/kg/min via INTRAVENOUS

## 2017-11-09 MED ORDER — ONDANSETRON HCL 4 MG/2ML IJ SOLN: 4.0000 mg | Freq: Four times a day (QID) | INTRAMUSCULAR | Status: DC | PRN

## 2017-11-09 MED ORDER — BUPIVACAINE HCL (PF) 0.25 % IJ SOLN
INTRAMUSCULAR | Status: AC
  Filled 2017-11-09: qty 30

## 2017-11-09 MED ORDER — FENTANYL CITRATE (PF) 100 MCG/2ML IJ SOLN
INTRAMUSCULAR | Status: DC | PRN
  Administered 2017-11-09 (×2): 50 ug via INTRAVENOUS

## 2017-11-09 MED ORDER — SODIUM CHLORIDE 0.9 % IV SOLN: 250.0000 mL | INTRAVENOUS | Status: DC | PRN

## 2017-11-09 MED ORDER — RIVAROXABAN 20 MG PO TABS: 20.0000 mg | ORAL_TABLET | Freq: Every day | ORAL | Status: DC

## 2017-11-09 MED ORDER — SODIUM CHLORIDE 0.9 % IV SOLN
INTRAVENOUS | Status: DC
  Administered 2017-11-09 (×2): via INTRAVENOUS

## 2017-11-09 MED ORDER — OFF THE BEAT BOOK
Freq: Once | Status: AC
  Administered 2017-11-09: 17:00:00
  Filled 2017-11-09: qty 1

## 2017-11-09 MED ORDER — DEXAMETHASONE SODIUM PHOSPHATE 10 MG/ML IJ SOLN
INTRAMUSCULAR | Status: DC | PRN
  Administered 2017-11-09: 10 mg via INTRAVENOUS

## 2017-11-09 MED ORDER — PROPOFOL 10 MG/ML IV BOLUS
INTRAVENOUS | Status: DC | PRN
  Administered 2017-11-09: 100 mg via INTRAVENOUS

## 2017-11-09 MED ORDER — HEPARIN SODIUM (PORCINE) 1000 UNIT/ML IJ SOLN
INTRAMUSCULAR | Status: DC | PRN
  Administered 2017-11-09: 2000 [IU] via INTRAVENOUS
  Administered 2017-11-09: 14000 [IU] via INTRAVENOUS
  Administered 2017-11-09: 2000 [IU] via INTRAVENOUS

## 2017-11-09 MED ORDER — BUPIVACAINE HCL (PF) 0.25 % IJ SOLN
INTRAMUSCULAR | Status: DC | PRN
  Administered 2017-11-09: 30 mL

## 2017-11-09 MED ORDER — CARVEDILOL 25 MG PO TABS
25.0000 mg | ORAL_TABLET | Freq: Two times a day (BID) | ORAL | Status: DC
  Administered 2017-11-09: 14:00:00 25 mg via ORAL
  Filled 2017-11-09: qty 1
  Filled 2017-11-09: qty 2

## 2017-11-09 MED ORDER — DILTIAZEM HCL ER COATED BEADS 180 MG PO CP24
180.0000 mg | ORAL_CAPSULE | Freq: Every day | ORAL | Status: DC
  Administered 2017-11-09: 180 mg via ORAL
  Filled 2017-11-09: qty 1

## 2017-11-09 MED ORDER — LIDOCAINE 2% (20 MG/ML) 5 ML SYRINGE
INTRAMUSCULAR | Status: DC | PRN
  Administered 2017-11-09: 40 mg via INTRAVENOUS

## 2017-11-09 MED ORDER — ACETAMINOPHEN 325 MG PO TABS: 650.0000 mg | ORAL_TABLET | ORAL | Status: DC | PRN

## 2017-11-09 MED ORDER — PANTOPRAZOLE SODIUM 40 MG PO TBEC
40.0000 mg | DELAYED_RELEASE_TABLET | Freq: Every day | ORAL | Status: DC
  Administered 2017-11-09: 40 mg via ORAL
  Filled 2017-11-09: qty 1

## 2017-11-09 MED ORDER — ADULT MULTIVITAMIN LIQUID CH: 30.0000 mL | Freq: Every day | ORAL | Status: DC

## 2017-11-09 MED ORDER — PROTAMINE SULFATE 10 MG/ML IV SOLN
INTRAVENOUS | Status: DC | PRN
  Administered 2017-11-09: 50 mg via INTRAVENOUS

## 2017-11-09 MED ORDER — SODIUM CHLORIDE 0.9% FLUSH: 3.0000 mL | INTRAVENOUS | Status: DC | PRN

## 2017-11-09 MED ORDER — ROCURONIUM BROMIDE 10 MG/ML (PF) SYRINGE
PREFILLED_SYRINGE | INTRAVENOUS | Status: DC | PRN
  Administered 2017-11-09: 50 mg via INTRAVENOUS

## 2017-11-09 MED ORDER — SODIUM CHLORIDE 0.9% FLUSH
3.0000 mL | Freq: Two times a day (BID) | INTRAVENOUS | Status: DC
  Administered 2017-11-09: 3 mL via INTRAVENOUS

## 2017-11-09 SURGICAL SUPPLY — 18 items
BLANKET WARM UNDERBOD FULL ACC (MISCELLANEOUS) ×2 IMPLANT
CATH MAPPNG PENTARAY F 2-6-2MM (CATHETERS) IMPLANT
CATH SMTCH THERMOCOOL SF DF (CATHETERS) ×2 IMPLANT
CATH SOUNDSTAR 3D IMAGING (CATHETERS) ×2 IMPLANT
CATH WEBSTER BI DIR CS D-F CRV (CATHETERS) ×2 IMPLANT
COVER SWIFTLINK CONNECTOR (BAG) ×2 IMPLANT
PACK EP LATEX FREE (CUSTOM PROCEDURE TRAY) ×3
PACK EP LF (CUSTOM PROCEDURE TRAY) ×1 IMPLANT
PAD PRO RADIOLUCENT 2001M-C (PAD) ×3 IMPLANT
PATCH CARTO3 (PAD) ×2 IMPLANT
PENTARAY F 2-6-2MM (CATHETERS) ×3
SHEATH AVANTI 11F 11CM (SHEATH) ×2 IMPLANT
SHEATH BAYLIS SUREFLEX  M 8.5 (SHEATH) ×4
SHEATH BAYLIS SUREFLEX M 8.5 (SHEATH) IMPLANT
SHEATH BAYLIS TRANSSEPTAL 98CM (NEEDLE) ×2 IMPLANT
SHEATH PINNACLE 7F 10CM (SHEATH) ×2 IMPLANT
SHEATH PINNACLE 8F 10CM (SHEATH) ×4 IMPLANT
SHEATH PINNACLE 9F 10CM (SHEATH) ×4 IMPLANT

## 2017-11-09 NOTE — Anesthesia Procedure Notes (Signed)
Procedure Name: Intubation Date/Time: 11/09/2017 8:54 AM Performed by: Lowella Dell, CRNA Pre-anesthesia Checklist: Patient identified, Emergency Drugs available, Suction available and Patient being monitored Patient Re-evaluated:Patient Re-evaluated prior to induction Oxygen Delivery Method: Circle System Utilized Preoxygenation: Pre-oxygenation with 100% oxygen Induction Type: IV induction Ventilation: Mask ventilation without difficulty and Oral airway inserted - appropriate to patient size Laryngoscope Size: Mac and 3 Grade View: Grade II Tube type: Oral Tube size: 7.0 mm Number of attempts: 1 Airway Equipment and Method: Stylet Placement Confirmation: ETT inserted through vocal cords under direct vision,  positive ETCO2 and breath sounds checked- equal and bilateral Secured at: 22 cm Tube secured with: Tape Dental Injury: Teeth and Oropharynx as per pre-operative assessment

## 2017-11-09 NOTE — H&P (Signed)
Mary Poole has presented today for surgery, with the diagnosis of atrial fibrillation.  The various methods of treatment have been discussed with the patient and family. After consideration of risks, benefits and other options for treatment, the patient has consented to  Procedure(s): Catheter ablation as a surgical intervention .  Risks include but not limited to bleeding, tamponade, heart block, stroke, damage to surrounding organs, among others. The patient's history has been reviewed, patient examined, no change in status, stable for surgery.  I have reviewed the patient's chart and labs.  Questions were answered to the patient's satisfaction.    [name] [name], MD 11/09/2017 7:56 AM

## 2017-11-09 NOTE — Discharge Instructions (Signed)
°  Post procedure care instructions No driving for 4 days. No lifting over 5 lbs for 1 week. No vigorous or sexual activity for 1 week. You may return to work on 11/16/17. Keep procedure site clean & dry. If you notice increased pain, swelling, bleeding or pus, call/return!  You may shower, but no soaking baths/hot tubs/pools for 1 week.    You have an appointment set up with the Atrial Fibrillation Clinic.  Multiple studies have shown that being followed by a dedicated atrial fibrillation clinic in addition to the standard care you receive from your other physicians improves health. We believe that enrollment in the atrial fibrillation clinic will allow [place] to better care for you.   The phone number to the Atrial Fibrillation Clinic is [phone]. The clinic is staffed Monday through Friday from 8:30am to 5pm.  Parking Directions: The clinic is located in the Heart and Vascular Building connected to [facility] 1)From [address] turn on to [address] and go to the 3rd entrance  (Heart and Vascular entrance) on the right. 2)Look to the right for Heart &Vascular Parking Garage. 3)A code for the entrance is required please call the clinic to receive this.   4)Take the elevators to the 1st floor. Registration is in the room with the glass walls at the end of the hallway.  If you have any trouble parking or locating the clinic, please dont hesitate to call [phone].

## 2017-11-09 NOTE — Progress Notes (Signed)
patient is seen and examined by Dr. [name] B/l groins/procedure sites are stable VSS Activity restrictions/site care were reviewed. Post procedure f/u is in place Patient feels well, no site pain/discomfort, no CP or SOB  Planned for discharge tonight once bedrest is completed, after ambulation of the patient/sites remain stable  Francis Dowse, PA-C

## 2017-11-09 NOTE — Anesthesia Postprocedure Evaluation (Signed)
Anesthesia Post Note  Patient: Mary Poole  Procedure(s) Performed: ATRIAL FIBRILLATION ABLATION (N/A )     Patient location during evaluation: PACU Anesthesia Type: General Level of consciousness: awake and alert, oriented and patient cooperative Pain management: pain level controlled Vital Signs Assessment: post-procedure vital signs reviewed and stable Respiratory status: spontaneous breathing, nonlabored ventilation and respiratory function stable Cardiovascular status: blood pressure returned to baseline and stable Postop Assessment: no apparent nausea or vomiting Anesthetic complications: no    Last Vitals:  Vitals:   11/09/17 1230 11/09/17 1235  BP: (!) 172/89 (!) 162/74  Pulse: 62 62  Resp: 14 14  Temp:    SpO2: 98% 100%    Last Pain:  Vitals:   11/09/17 1200  TempSrc: Skin  PainSc: 0-No pain                 Ashia Dehner,E. Grey Schlauch

## 2017-11-09 NOTE — Transfer of Care (Signed)
Immediate Anesthesia Transfer of Care Note  Patient: Mary Poole  Procedure(s) Performed: ATRIAL FIBRILLATION ABLATION (N/A )  Patient Location: PACU  Anesthesia Type:General  Level of Consciousness: awake, alert , oriented and patient cooperative  Airway & Oxygen Therapy: Patient Spontanous Breathing and Patient connected to nasal cannula oxygen  Post-op Assessment: Report given to RN, Post -op Vital signs reviewed and stable and Patient moving all extremities X 4  Post vital signs: Reviewed and stable  Last Vitals:  Vitals Value Taken Time  BP 169/82 11/09/2017 12:06 PM  Temp 35.9 C 11/09/2017 12:00 PM  Pulse 63 11/09/2017 12:07 PM  Resp 15 11/09/2017 12:07 PM  SpO2 89 % 11/09/2017 12:07 PM  Vitals shown include unvalidated device data.  Last Pain:  Vitals:   11/09/17 1200  TempSrc: Skin  PainSc: 0-No pain         Complications: No apparent anesthesia complications

## 2017-11-16 ENCOUNTER — Telehealth: Payer: Self-pay | Admitting: Cardiology

## 2017-11-16 NOTE — Telephone Encounter (Signed)
Patient called to AF clinic stating she is having HR in the 120s BP 116/91. Can feel her heart fluttering but otherwise asymptomatic. She wants to go to work today which I told her was ok. Discussed with Mary Balsam, NP will increase cardizem to 180mg  BID and call back on Friday morning with HR/BP. If still elevated will bring in for appt. Pt in agreement.

## 2017-11-16 NOTE — Telephone Encounter (Signed)
  Had ablation on 11/09/17 and is experiencing elevated heart rate and still having flutters. Wants to know if that's normal after having an ablation.

## 2017-11-21 ENCOUNTER — Other Ambulatory Visit (HOSPITAL_COMMUNITY): Payer: Self-pay | Admitting: *Deleted

## 2017-11-21 MED ORDER — DILTIAZEM HCL ER COATED BEADS 180 MG PO CP24: 180.0000 mg | ORAL_CAPSULE | Freq: Two times a day (BID) | ORAL | 3 refills | Status: DC

## 2017-11-23 NOTE — Telephone Encounter (Signed)
Patient has a 10 week follow up appointment scheduled for 10/24 2019 2pm. Patient understands she needs to keep this appointment for insurance compliance. Patient was grateful for the call and thanked me.

## 2017-11-25 ENCOUNTER — Telehealth (HOSPITAL_COMMUNITY): Payer: Self-pay | Admitting: *Deleted

## 2017-11-25 MED ORDER — FUROSEMIDE 20 MG PO TABS: ORAL_TABLET | ORAL | 0 refills | Status: DC

## 2017-11-25 NOTE — Telephone Encounter (Signed)
Patient is having issues with ankle swelling since intake of an extremely salty meal. She does not weigh daily. She does not correlate swelling with the increase in cardizem only the salty meal b/c she had been watching her diet very carefully. Discussed with Rudi Coco NP - lasix 20mg  daily for 3 days then call in with report. Pt in agreement.

## 2017-11-28 NOTE — Telephone Encounter (Signed)
Patient states after 1 dose of lasix her swelling greatly decreased so she did not take sat/sun but went to salad bar last night and swelling returned she will take 1/2 tablet of lasix this morning. She will call back if further issues. Her HR was in the 50s this morning but she feels fine - she will continue to monitor. If HR consistently staying below 60 and shes staying in rhythm may have to reduce cardizem but pt will watch and let us know.

## 2017-12-01 ENCOUNTER — Encounter: Payer: Self-pay | Admitting: Cardiology

## 2017-12-01 ENCOUNTER — Telehealth: Payer: Self-pay | Admitting: *Deleted

## 2017-12-01 ENCOUNTER — Ambulatory Visit (INDEPENDENT_AMBULATORY_CARE_PROVIDER_SITE_OTHER): Payer: Medicare Other | Admitting: Cardiology

## 2017-12-01 VITALS — BP 150/102 | HR 101 | Ht 65.0 in | Wt 207.8 lb

## 2017-12-01 DIAGNOSIS — G4733 Obstructive sleep apnea (adult) (pediatric): Secondary | ICD-10-CM

## 2017-12-01 DIAGNOSIS — I10 Essential (primary) hypertension: Secondary | ICD-10-CM

## 2017-12-01 DIAGNOSIS — I483 Typical atrial flutter: Secondary | ICD-10-CM

## 2017-12-01 MED ORDER — DILTIAZEM HCL ER COATED BEADS 240 MG PO CP24: 240.0000 mg | ORAL_CAPSULE | Freq: Every day | ORAL | 11 refills | Status: DC

## 2017-12-01 MED ORDER — DILTIAZEM HCL ER COATED BEADS 240 MG PO CP24: 240.0000 mg | ORAL_CAPSULE | Freq: Every day | ORAL | 3 refills | Status: DC

## 2017-12-01 NOTE — Telephone Encounter (Signed)
-----   Message from Dustin Flock, RN sent at 12/01/2017  4:14 PM EDT ----- Hello, Dr. Mayford Knife ordered a change in CPAP pressures, auto at 4-14 cm H2O. The order was placed, please send. Thanks, [employer]

## 2017-12-01 NOTE — Telephone Encounter (Signed)
Order placed to [place] via community message to [place] today.

## 2017-12-01 NOTE — Progress Notes (Signed)
Cardiology Office Note:    Date:  12/01/2017   ID:  Mary Poole, DOB 06-16-1950, MRN [id number]  PCP:  Everrett Coombe, DO  Cardiologist:  No primary care provider on file.    Referring MD: Everrett Coombe, DO   Chief Complaint  Patient presents with  . Sleep Apnea  . Hypertension    History of Present Illness:    Mary Poole is a 67 y.o. female with a hx of atrial fibrillation, hypertension and GERD.  She was referred by Dr. Katrinka Blazing for sleep study.  She underwent home sleep study a year ago which showed an AHI of 15.3 with lowest oxygen and desaturation at 80%.  She underwent CPAP titration in May 2019 and was titrated to 10 cm H2O.  She is now here for follow-up. She is doing well with her CPAP device and thinks that she has gotten used to it.  She tolerates the mask and feels the pressure is adequate.  Since going on CPAP she feels rested in the am and has no significant daytime sleepiness.  She denies any significant mouth or nasal dryness or nasal congestion.  She does not think that he snores.     Past Medical History:  Diagnosis Date  . Anxiety   . Atrial fibrillation (HCC)   . Complication of anesthesia    "woke up during colonoscopy 02/2017" (11/09/2017)  . GERD (gastroesophageal reflux disease)   . H/O atrial flutter   . History of blood transfusion 02/2017   LGIB (11/09/2017)  . Hypertension   . Migraine    "a few/year" (11/09/2017)  . OSA on CPAP     Past Surgical History:  Procedure Laterality Date  . ATRIAL FIBRILLATION ABLATION N/A 11/09/2017   Procedure: ATRIAL FIBRILLATION ABLATION;  Surgeon: Regan Lemming, MD;  Location: [facility];  Service: Cardiovascular;  Laterality: N/A;  . COLONOSCOPY N/A 02/24/2017   Procedure: COLONOSCOPY;  Surgeon: Jeani Hawking, MD;  Location: [facility] ENDOSCOPY;  Service: Gastroenterology;  Laterality: N/A;  . ENTEROSCOPY N/A 02/24/2017   Procedure: ENTEROSCOPY;  Surgeon: Jeani Hawking, MD;  Location: [facility] ENDOSCOPY;   Service: Gastroenterology;  Laterality: N/A;  . HERNIA REPAIR  1960s  . WISDOM TOOTH EXTRACTION      Current Medications: Current Meds  Medication Sig  . carvedilol (COREG) 25 MG tablet Take 1 tablet (25 mg total) by mouth 2 (two) times daily.  Marland Kitchen diltiazem (CARDIZEM CD) 180 MG 24 hr capsule Take 1 capsule (180 mg total) by mouth 2 (two) times daily.  . furosemide (LASIX) 20 MG tablet Take 1 tablet by mouth daily for the next 3 days then as needed for swelling  . Multiple Vitamin (MULTIVITAMIN) LIQD Take 30 mLs by mouth daily.  Marland Kitchen omeprazole (PRILOSEC) 20 MG capsule Take 1 capsule (20 mg total) by mouth daily.  Marland Kitchen PRESCRIPTION MEDICATION Inhale into the lungs at bedtime. CPAP  . XARELTO 20 MG TABS tablet TAKE 1 TABLET BY MOUTH EVERY DAY (Patient taking differently: Take 20 mg by mouth at bedtime. )     Allergies:   Diphenhydramine hcl and Flecainide   Social History   Socioeconomic History  . Marital status: Married    Spouse name: Not on file  . Number of children: Not on file  . Years of education: Not on file  . Highest education level: Not on file  Occupational History  . Not on file  Social Needs  . Financial resource strain: Not hard at all  .  Food insecurity:    Worry: Never true    Inability: Never true  . Transportation needs:    Medical: No    Non-medical: No  Tobacco Use  . Smoking status: Former Smoker    Packs/day: 0.25    Years: 10.00    Pack years: 2.50    Types: Cigarettes  . Smokeless tobacco: Never Used  . Tobacco comment: 11/09/2017 "quit smoking in the 1980s"  Substance and Sexual Activity  . Alcohol use: Never    Frequency: Never  . Drug use: Never  . Sexual activity: Not Currently  Lifestyle  . Physical activity:    Days per week: Not on file    Minutes per session: Not on file  . Stress: Not on file  Relationships  . Social connections:    Talks on phone: Not on file    Gets together: Not on file    Attends religious service: Not on file      Active member of club or organization: Not on file    Attends meetings of clubs or organizations: Not on file    Relationship status: Not on file  Other Topics Concern  . Not on file  Social History Narrative  . Not on file     Family History: The patient's family history includes AAA (abdominal aortic aneurysm) in her sister; CAD in her father; Thyroid disease in her sister.  ROS:   Please see the history of present illness.    ROS  All other systems reviewed and negative.   EKGs/Labs/Other Studies Reviewed:    The following studies were reviewed today: PAP download  EKG:  EKG is ordered today and showed atrial flutter with 2 1 block and 127 bpm.  Recent Labs: 10/15/2017: Magnesium 1.9; TSH 0.452 10/30/2017: ALT 23 11/01/2017: BUN 17; Creatinine, Ser 1.13; Hemoglobin 12.7; Platelets 268; Potassium 3.9; Sodium 140   Recent Lipid Panel No results found for: CHOL, TRIG, HDL, CHOLHDL, VLDL, LDLCALC, LDLDIRECT  Physical Exam:    VS:  BP (!) 150/102   Pulse (!) 101   Ht 5\' 5"  (1.651 m)   Wt 207 lb 12.8 oz (94.3 kg)   SpO2 99%   BMI 34.58 kg/m     Wt Readings from Last 3 Encounters:  12/01/17 207 lb 12.8 oz (94.3 kg)  11/09/17 211 lb (95.7 kg)  10/31/17 210 lb 12.2 oz (95.6 kg)     GEN:  Well nourished, well developed in no acute distress HEENT: Normal NECK: No JVD; No carotid bruits LYMPHATICS: No lymphadenopathy CARDIAC: Irregularly irregular and tachycardic, no murmurs, rubs, gallops RESPIRATORY:  Clear to auscultation without rales, wheezing or rhonchi  ABDOMEN: Soft, non-tender, non-distended MUSCULOSKELETAL:  No edema; No deformity  SKIN: Warm and dry NEUROLOGIC:  Alert and oriented x 3 PSYCHIATRIC:  Normal affect   ASSESSMENT:    1. OSA (obstructive sleep apnea)   2. Essential hypertension   3. Morbidly obese (HCC)   4. Typical atrial flutter (HCC)    PLAN:    In order of problems listed above:  1.  OSA - the patient is tolerating PAP therapy  well without any problems. The PAP download was reviewed today and showed an AHI of 2.3/hr on 10 cm H2O with 100% compliance in using more than 4 hours nightly.  The patient has been using and benefiting from PAP use and will continue to benefit from therapy.   2.  HTN - BP is well controlled on exam today.  She will  continue on Cardizem CD 180 mg twice daily and carvedilol 25 mg twice daily.  3.  Morbid obesity -her BMI is 35 and she has multiple comorbidities including hypertension, OSA and atrial fibrillation.I have encouraged her to get into a routine exercise program and cut back on carbs and portions.   3.  Atrial flutter with RVR -she is status post recent A. fib ablation earlier in the month and now appears to be in atrial flutter with RVR at 127 bpm.  I am going to increase Cardizem to 240 mg daily.  She will continue on carvedilol 25 mg twice daily and Xarelto.  I am going to get her into A. fib clinic tomorrow to be evaluated.  I did explain to her that it is fairly common to have atrial arrhythmias in the first 3 months post ablation.  Medication Adjustments/Labs and Tests Ordered: Current medicines are reviewed at length with the patient today.  Concerns regarding medicines are outlined above.  Orders Placed This Encounter  Procedures  . EKG 12-Lead   No orders of the defined types were placed in this encounter.   Signed, Armanda Magic, MD  12/01/2017 2:46 PM    [facility]

## 2017-12-01 NOTE — Patient Instructions (Addendum)
Medication Instructions:  Increase: Cardizem to 240, daily, by mouth  If you need a refill on your cardiac medications before your next appointment, please call your pharmacy.   Lab work:  If you have labs (blood work) drawn today and your tests are completely normal, you will receive your results only by: Marland Kitchen MyChart Message (if you have MyChart) OR . A paper copy in the mail If you have any lab test that is abnormal or we need to change your treatment, we will call you to review the results.  Follow-Up: At [facility], you and your health needs are our priority.  As part of our continuing mission to provide you with exceptional heart care, we have created designated Provider Care Teams.  These Care Teams include your primary Cardiologist (physician) and Advanced Practice Providers (APPs -  Physician Assistants and Nurse Practitioners) who all work together to provide you with the care you need, when you need it. You will need a follow up appointment in 1 years.  Please call our office 2 months in advance to schedule this appointment.  You may see Dr. Mayford Knife or one of the following Advanced Practice Providers on your designated Care Team:   [place], PA-C Ronie Spies, PA-C . Jacolyn Reedy, PA-C

## 2017-12-01 NOTE — Addendum Note (Signed)
Addended by: Dustin Flock on: 12/01/2017 03:03 PM   Modules accepted: Orders

## 2017-12-02 ENCOUNTER — Encounter (HOSPITAL_COMMUNITY): Payer: Self-pay | Admitting: Nurse Practitioner

## 2017-12-02 ENCOUNTER — Ambulatory Visit (HOSPITAL_COMMUNITY)
Admission: RE | Admit: 2017-12-02 | Discharge: 2017-12-02 | Disposition: A | Discharge status: Home / Self Care | Payer: Medicare Other | Source: Ambulatory Visit | Attending: Nurse Practitioner | Admitting: Nurse Practitioner

## 2017-12-02 VITALS — BP 118/64 | HR 57 | Ht 65.0 in | Wt 204.0 lb

## 2017-12-02 DIAGNOSIS — I4891 Unspecified atrial fibrillation: Secondary | ICD-10-CM | POA: Diagnosis not present

## 2017-12-02 DIAGNOSIS — Z7901 Long term (current) use of anticoagulants: Secondary | ICD-10-CM | POA: Insufficient documentation

## 2017-12-02 DIAGNOSIS — Z87891 Personal history of nicotine dependence: Secondary | ICD-10-CM | POA: Insufficient documentation

## 2017-12-02 DIAGNOSIS — Z9889 Other specified postprocedural states: Secondary | ICD-10-CM | POA: Diagnosis not present

## 2017-12-02 DIAGNOSIS — Z8249 Family history of ischemic heart disease and other diseases of the circulatory system: Secondary | ICD-10-CM | POA: Insufficient documentation

## 2017-12-02 DIAGNOSIS — K219 Gastro-esophageal reflux disease without esophagitis: Secondary | ICD-10-CM | POA: Diagnosis not present

## 2017-12-02 DIAGNOSIS — G4733 Obstructive sleep apnea (adult) (pediatric): Secondary | ICD-10-CM | POA: Diagnosis not present

## 2017-12-02 DIAGNOSIS — I10 Essential (primary) hypertension: Secondary | ICD-10-CM | POA: Insufficient documentation

## 2017-12-02 DIAGNOSIS — Z79899 Other long term (current) drug therapy: Secondary | ICD-10-CM | POA: Insufficient documentation

## 2017-12-02 DIAGNOSIS — I4892 Unspecified atrial flutter: Secondary | ICD-10-CM | POA: Diagnosis not present

## 2017-12-02 DIAGNOSIS — F419 Anxiety disorder, unspecified: Secondary | ICD-10-CM | POA: Insufficient documentation

## 2017-12-02 DIAGNOSIS — R001 Bradycardia, unspecified: Secondary | ICD-10-CM | POA: Insufficient documentation

## 2017-12-02 NOTE — Progress Notes (Signed)
Primary Care Physician: Everrett Coombe, DO Referring Physician: Dr. Mayford Knife EP: Dr. Hassel Neth Mary Poole is a 67 y.o. female with a h/o afib s/p ablation 10/2, that was  seeing Dr.Turner yesterday for OSA and was noted in to be in rapid atrial flutter. EKG not in Epic yet for me to review. Her Cardizem was increased and she is in the afib clinic today for f/u. She has returned to SR. This is the first episode that she noticed not being in SR. She had a stressful day yesterday. No groin or swallowing issues.  Today, she denies symptoms of palpitations, chest pain, shortness of breath, orthopnea, PND, lower extremity edema, dizziness, presyncope, syncope, or neurologic sequela. The patient is tolerating medications without difficulties and is otherwise without complaint today.   Past Medical History:  Diagnosis Date  . Anxiety   . Atrial fibrillation (HCC)   . Complication of anesthesia    "woke up during colonoscopy 02/2017" (11/09/2017)  . GERD (gastroesophageal reflux disease)   . H/O atrial flutter   . History of blood transfusion 02/2017   LGIB (11/09/2017)  . Hypertension   . Migraine    "a few/year" (11/09/2017)  . OSA on CPAP    Past Surgical History:  Procedure Laterality Date  . ATRIAL FIBRILLATION ABLATION N/A 11/09/2017   Procedure: ATRIAL FIBRILLATION ABLATION;  Surgeon: Regan Lemming, MD;  Location: [facility] INVAS[facility] CV LAB;  Service: Cardiovascular;  Laterality: N/A;  . COLONOSCOPY N/A 02/24/2017   Procedure: COLONOSCOPY;  Surgeon: Jeani Hawking, MD;  Location: [facility] ENDOSCOPY;  Service: Gastroenterology;  Laterality: N/A;  . ENTEROSCOPY N/A 02/24/2017   Procedure: ENTEROSCOPY;  Surgeon: Jeani Hawking, MD;  Location: [facility] ENDOSCOPY;  Service: Gastroenterology;  Laterality: N/A;  . HERNIA REPAIR  1960s  . WISDOM TOOTH EXTRACTION      Current Outpatient Medications  Medication Sig Dispense Refill  . carvedilol (COREG) 25 MG tablet Take 1 tablet (25 mg total) by mouth 2  (two) times daily. 180 tablet 3  . diltiazem (CARDIZEM CD) 240 MG 24 hr capsule Take 1 capsule (240 mg total) by mouth daily. 30 capsule 11  . furosemide (LASIX) 20 MG tablet Take 1 tablet by mouth daily for the next 3 days then as needed for swelling 15 tablet 0  . Multiple Vitamin (MULTIVITAMIN) LIQD Take 30 mLs by mouth daily.    Marland Kitchen omeprazole (PRILOSEC) 20 MG capsule Take 1 capsule (20 mg total) by mouth daily. 90 capsule 2  . PRESCRIPTION MEDICATION Inhale into the lungs at bedtime. CPAP    . XARELTO 20 MG TABS tablet TAKE 1 TABLET BY MOUTH EVERY DAY (Patient taking differently: Take 20 mg by mouth at bedtime. ) 90 tablet 2   No current facility-administered medications for this encounter.     Allergies  Allergen Reactions  . Diphenhydramine Hcl Hives and Other (See Comments)    Erratic heartbeat  . Flecainide Rash    Social History   Socioeconomic History  . Marital status: Married    Spouse name: Not on file  . Number of children: Not on file  . Years of education: Not on file  . Highest education level: Not on file  Occupational History  . Not on file  Social Needs  . Financial resource strain: Not hard at all  . Food insecurity:    Worry: Never true    Inability: Never true  . Transportation needs:    Medical: No    Non-medical: No  Tobacco Use  . Smoking status: Former Smoker    Packs/day: 0.25    Years: 10.00    Pack years: 2.50    Types: Cigarettes  . Smokeless tobacco: Never Used  . Tobacco comment: 11/09/2017 "quit smoking in the 1980s"  Substance and Sexual Activity  . Alcohol use: Never    Frequency: Never  . Drug use: Never  . Sexual activity: Not Currently  Lifestyle  . Physical activity:    Days per week: Not on file    Minutes per session: Not on file  . Stress: Not on file  Relationships  . Social connections:    Talks on phone: Not on file    Gets together: Not on file    Attends religious service: Not on file    Active member of club or  organization: Not on file    Attends meetings of clubs or organizations: Not on file    Relationship status: Not on file  . Intimate partner violence:    Fear of current or ex partner: Not on file    Emotionally abused: Not on file    Physically abused: Not on file    Forced sexual activity: Not on file  Other Topics Concern  . Not on file  Social History Narrative  . Not on file    Family History  Problem Relation Age of Onset  . CAD Father   . AAA (abdominal aortic aneurysm) Sister   . Thyroid disease Sister     ROS- All systems are reviewed and negative except as per the HPI above  Physical Exam: Vitals:   12/02/17 0834  BP: 118/64  Pulse: (!) 57  Weight: 92.5 kg  Height: 5\' 5"  (1.651 m)   Wt Readings from Last 3 Encounters:  12/02/17 92.5 kg  12/01/17 94.3 kg  11/09/17 95.7 kg    Labs: Lab Results  Component Value Date   NA 140 11/01/2017   K 3.9 11/01/2017   CL 105 11/01/2017   CO2 24 11/01/2017   GLUCOSE 112 (H) 11/01/2017   BUN 17 11/01/2017   CREATININE 1.13 (H) 11/01/2017   CALCIUM 9.6 11/01/2017   PHOS 3.1 10/15/2017   MG 1.9 10/15/2017   Lab Results  Component Value Date   INR 1.14 10/30/2017   No results found for: CHOL, HDL, LDLCALC, TRIG   GEN- The patient is well appearing, alert and oriented x 3 today.   Head- normocephalic, atraumatic Eyes-  Sclera clear, conjunctiva pink Ears- hearing intact Oropharynx- clear Neck- supple, no JVP Lymph- no cervical lymphadenopathy Lungs- Clear to ausculation bilaterally, normal work of breathing Heart- Regular rate and rhythm, no murmurs, rubs or gallops, PMI not laterally displaced GI- soft, NT, ND, + BS Extremities- no clubbing, cyanosis, or edema MS- no significant deformity or atrophy Skin- no rash or lesion Psych- euthymic mood, full affect Neuro- strength and sensation are intact  EKG- Sinus brady at 58 bpm, with repolarization changes.     Assessment and Plan: 1. Afib/flutter S/p  ablation 10/2 In flutter yesterday when seen by Dr. Mayford Knife but is now back in SR Continue  Cardizem at 240 mg daily Continue xarelto 20 mg a day, understands not to interrupt  therapy  F/u here 11/7  Lupita Leash [facility] [address] [place], [place] [address] [phone]

## 2017-12-05 ENCOUNTER — Telehealth: Payer: Self-pay | Admitting: Cardiology

## 2017-12-05 MED ORDER — DILTIAZEM HCL ER COATED BEADS 120 MG PO CP24: ORAL_CAPSULE | ORAL | 12 refills | Status: DC

## 2017-12-05 NOTE — Telephone Encounter (Signed)
Follow up  Patient called back she states that she will follow the medication that is stated per the previous note.

## 2017-12-05 NOTE — Telephone Encounter (Signed)
Spoke with the patient, she accepted and understood the medication dose change.

## 2017-12-05 NOTE — Telephone Encounter (Signed)
New Message:     Pt was seen last week by Dr Mayford Knife. She said her Diltiazem wastsupposed to be increased. The new prescription was for only 240 mg, she was already taking 360 mg.

## 2017-12-05 NOTE — Telephone Encounter (Signed)
Take Cardizem CD 240 mg every morning and 180 mg qpm

## 2017-12-05 NOTE — Telephone Encounter (Signed)
The patient was taking Diltiazem 180 BID, we changed to Diltiazem 240, daily. Do we need to increase to BID, since she was originally taking more?

## 2017-12-15 ENCOUNTER — Ambulatory Visit (HOSPITAL_COMMUNITY): Payer: Medicare Other | Admitting: Nurse Practitioner

## 2017-12-16 ENCOUNTER — Ambulatory Visit: Payer: Medicare Other | Admitting: Family Medicine

## 2017-12-19 ENCOUNTER — Ambulatory Visit (INDEPENDENT_AMBULATORY_CARE_PROVIDER_SITE_OTHER): Payer: Medicare Other | Admitting: Family Medicine

## 2017-12-19 ENCOUNTER — Encounter: Payer: Self-pay | Admitting: Family Medicine

## 2017-12-19 DIAGNOSIS — J3089 Other allergic rhinitis: Secondary | ICD-10-CM | POA: Diagnosis not present

## 2017-12-19 DIAGNOSIS — K219 Gastro-esophageal reflux disease without esophagitis: Secondary | ICD-10-CM

## 2017-12-19 MED ORDER — PANTOPRAZOLE SODIUM 40 MG PO TBEC: 40.0000 mg | DELAYED_RELEASE_TABLET | Freq: Every day | ORAL | 1 refills | Status: DC

## 2017-12-19 NOTE — Assessment & Plan Note (Signed)
-  Having frequent throat clearing, likely from reflux and AR.   -Recommend trial of flonase daily -Change PPI -She'll let me know if not improving.

## 2017-12-19 NOTE — Patient Instructions (Signed)
-  Start pantoprazole in place of omeprazole -Start flonase daily -Let me know if symptoms are not improving.

## 2017-12-19 NOTE — Progress Notes (Signed)
Mary Poole - 67 y.o. female MRN [id number]  Date of birth: 02-13-1950  Subjective Chief Complaint  Patient presents with  . Gastroesophageal Reflux    HPI Mary Poole is a 67 y.o. female with history of A. Fib/A. Flutter, GERD and HTN.  Since I last saw her she has been in and out of the hospital due to A. Fib recently had ablation and has been doing well.  She reports that she has had more frequent symptoms of clearing her throat as well as sour taste in the back of her throat.  This has improved with using nasal saline.  Still has some sinus congestion even with saline use.  She denies chest pain, shortness of breath, nausea, sinus pain, fever, or chills.   ROS:  A comprehensive ROS was completed and negative except as noted per HPI  Allergies  Allergen Reactions  . Diphenhydramine Hcl Hives and Other (See Comments)    Erratic heartbeat  . Flecainide Rash    Past Medical History:  Diagnosis Date  . Anxiety   . Atrial fibrillation (HCC)   . Complication of anesthesia    "woke up during colonoscopy 02/2017" (11/09/2017)  . GERD (gastroesophageal reflux disease)   . H/O atrial flutter   . History of blood transfusion 02/2017   LGIB (11/09/2017)  . Hypertension   . Migraine    "a few/year" (11/09/2017)  . OSA on CPAP     Past Surgical History:  Procedure Laterality Date  . ATRIAL FIBRILLATION ABLATION N/A 11/09/2017   Procedure: ATRIAL FIBRILLATION ABLATION;  Surgeon: Regan Lemming, MD;  Location: [facility] INVASIVE CV LAB;  Service: Cardiovascular;  Laterality: N/A;  . COLONOSCOPY N/A 02/24/2017   Procedure: COLONOSCOPY;  Surgeon: Jeani Hawking, MD;  Location: [facility] ENDOSCOPY;  Service: Gastroenterology;  Laterality: N/A;  . ENTEROSCOPY N/A 02/24/2017   Procedure: ENTEROSCOPY;  Surgeon: Jeani Hawking, MD;  Location: [facility] ENDOSCOPY;  Service: Gastroenterology;  Laterality: N/A;  . HERNIA REPAIR  1960s  . WISDOM TOOTH EXTRACTION      Social History   Socioeconomic History  .  Marital status: Married    Spouse name: Not on file  . Number of children: Not on file  . Years of education: Not on file  . Highest education level: Not on file  Occupational History  . Not on file  Social Needs  . Financial resource strain: Not hard at all  . Food insecurity:    Worry: Never true    Inability: Never true  . Transportation needs:    Medical: No    Non-medical: No  Tobacco Use  . Smoking status: Former Smoker    Packs/day: 0.25    Years: 10.00    Pack years: 2.50    Types: Cigarettes  . Smokeless tobacco: Never Used  . Tobacco comment: 11/09/2017 "quit smoking in the 1980s"  Substance and Sexual Activity  . Alcohol use: Never    Frequency: Never  . Drug use: Never  . Sexual activity: Not Currently  Lifestyle  . Physical activity:    Days per week: Not on file    Minutes per session: Not on file  . Stress: Not on file  Relationships  . Social connections:    Talks on phone: Not on file    Gets together: Not on file    Attends religious service: Not on file    Active member of club or organization: Not on file    Attends meetings of clubs or organizations:  Not on file    Relationship status: Not on file  Other Topics Concern  . Not on file  Social History Narrative  . Not on file    Family History  Problem Relation Age of Onset  . CAD Father   . AAA (abdominal aortic aneurysm) Sister   . Thyroid disease Sister     Health Maintenance  Topic Date Due  . Hepatitis C Screening  [birth year]  . TETANUS/TDAP  10/10/1969  . MAMMOGRAM  10/10/2000  . DEXA SCAN  10/11/2015  . PNA vac Low Risk Adult (1 of 2 - PCV13) 10/11/2015  . INFLUENZA VACCINE  09/08/2017  . COLONOSCOPY  02/25/2027    ----------------------------------------------------------------------------------------------------------------------------------------------------------------------------------------------------------------- Physical Exam BP 130/80   Pulse (!) 59   Temp 97.8 F  (36.6 C) (Oral)   Wt 210 lb (95.3 kg)   SpO2 95%   BMI 34.95 kg/m   Physical Exam  Constitutional: She is oriented to person, place, and time. She appears well-nourished. No distress.  HENT:  Head: Normocephalic and atraumatic.  Mouth/Throat: Oropharynx is clear and moist.  Turbinates edematous  Eyes: No scleral icterus.  Neck: Neck supple. No thyromegaly present.  Cardiovascular: Normal rate, regular rhythm and normal heart sounds.  Pulmonary/Chest: Effort normal and breath sounds normal.  Lymphadenopathy:    She has no cervical adenopathy.  Neurological: She is alert and oriented to person, place, and time.  Skin: Skin is warm.  Psychiatric: She has a normal mood and affect. Her behavior is normal.    ------------------------------------------------------------------------------------------------------------------------------------------------------------------------------------------------------------------- Assessment and Plan  GERD (gastroesophageal reflux disease) -Start pantoprazole in place of omeprazole  Perennial allergic rhinitis -Having frequent throat clearing, likely from reflux and AR.   -Recommend trial of flonase daily -Change PPI -She'll let me know if not improving.

## 2017-12-19 NOTE — Assessment & Plan Note (Signed)
-  Start pantoprazole in place of omeprazole

## 2017-12-28 ENCOUNTER — Ambulatory Visit (HOSPITAL_COMMUNITY)
Admission: RE | Admit: 2017-12-28 | Discharge: 2017-12-28 | Disposition: A | Discharge status: Home / Self Care | Payer: Medicare Other | Source: Ambulatory Visit | Attending: Nurse Practitioner | Admitting: Nurse Practitioner

## 2017-12-28 ENCOUNTER — Encounter (HOSPITAL_COMMUNITY): Payer: Self-pay | Admitting: Nurse Practitioner

## 2017-12-28 VITALS — BP 118/82 | HR 65 | Ht 65.0 in | Wt 210.0 lb

## 2017-12-28 DIAGNOSIS — K219 Gastro-esophageal reflux disease without esophagitis: Secondary | ICD-10-CM | POA: Insufficient documentation

## 2017-12-28 DIAGNOSIS — Z79899 Other long term (current) drug therapy: Secondary | ICD-10-CM | POA: Diagnosis not present

## 2017-12-28 DIAGNOSIS — I4892 Unspecified atrial flutter: Secondary | ICD-10-CM

## 2017-12-28 DIAGNOSIS — Z87891 Personal history of nicotine dependence: Secondary | ICD-10-CM | POA: Insufficient documentation

## 2017-12-28 DIAGNOSIS — G4733 Obstructive sleep apnea (adult) (pediatric): Secondary | ICD-10-CM | POA: Insufficient documentation

## 2017-12-28 DIAGNOSIS — Z7901 Long term (current) use of anticoagulants: Secondary | ICD-10-CM | POA: Diagnosis not present

## 2017-12-28 DIAGNOSIS — Z8249 Family history of ischemic heart disease and other diseases of the circulatory system: Secondary | ICD-10-CM | POA: Diagnosis not present

## 2017-12-28 DIAGNOSIS — Z9889 Other specified postprocedural states: Secondary | ICD-10-CM | POA: Insufficient documentation

## 2017-12-28 DIAGNOSIS — I10 Essential (primary) hypertension: Secondary | ICD-10-CM | POA: Diagnosis not present

## 2017-12-28 DIAGNOSIS — I4891 Unspecified atrial fibrillation: Secondary | ICD-10-CM | POA: Insufficient documentation

## 2017-12-28 DIAGNOSIS — R9431 Abnormal electrocardiogram [ECG] [EKG]: Secondary | ICD-10-CM | POA: Insufficient documentation

## 2017-12-28 NOTE — Patient Instructions (Signed)
Cardizem 180mg  twice a day

## 2017-12-28 NOTE — Progress Notes (Signed)
Primary Care Physician: Everrett Coombe, DO Referring Physician: Dr. Mayford Knife EP: Dr. Hassel Neth Mary Poole is a 67 y.o. female with a h/o afib s/p ablation 10/2, that was  seeing Dr.Turner 10/24 for OSA and was noted in to be in rapid atrial flutter.  Her Cardizem was increased to 240 mg am and continued at 180 mg pm. She is in the afib clinic today for f/u. She is in SR and has not noted afib  SR. She has noted some LLE since increase in Cardizem. No groin or swallowing issues.  Today, she denies symptoms of palpitations, chest pain, shortness of breath, orthopnea, PND, lower extremity edema, dizziness, presyncope, syncope, or neurologic sequela. The patient is tolerating medications without difficulties and is otherwise without complaint today.   Past Medical History:  Diagnosis Date  . Anxiety   . Atrial fibrillation (HCC)   . Complication of anesthesia    "woke up during colonoscopy 02/2017" (11/09/2017)  . GERD (gastroesophageal reflux disease)   . H/O atrial flutter   . History of blood transfusion 02/2017   LGIB (11/09/2017)  . Hypertension   . Migraine    "a few/year" (11/09/2017)  . OSA on CPAP    Past Surgical History:  Procedure Laterality Date  . ATRIAL FIBRILLATION ABLATION N/A 11/09/2017   Procedure: ATRIAL FIBRILLATION ABLATION;  Surgeon: Regan Lemming, MD;  [facility]: [facility];  Service: Cardiovascular;  Laterality: N/A;  . COLONOSCOPY N/A 02/24/2017   Procedure: COLONOSCOPY;  Surgeon: Jeani Hawking, MD;  Location: [facility] ENDOSCOPY;  Service: Gastroenterology;  Laterality: N/A;  . ENTEROSCOPY N/A 02/24/2017   Procedure: ENTEROSCOPY;  Surgeon: Jeani Hawking, MD;  Location: [facility] ENDOSCOPY;  Service: Gastroenterology;  Laterality: N/A;  . HERNIA REPAIR  1960s  . WISDOM TOOTH EXTRACTION      Current Outpatient Medications  Medication Sig Dispense Refill  . carvedilol (COREG) 25 MG tablet Take 1 tablet (25 mg total) by mouth 2 (two) times daily. 180 tablet 3    . diltiazem (DILTIAZEM CD) 180 MG 24 hr capsule Take 180 mg by mouth 2 (two) times daily.    . Multiple Vitamin (MULTIVITAMIN) LIQD Take 30 mLs by mouth daily.    . pantoprazole (PROTONIX) 40 MG tablet Take 1 tablet (40 mg total) by mouth daily. 90 tablet 1  . PRESCRIPTION MEDICATION Inhale into the lungs at bedtime. CPAP    . XARELTO 20 MG TABS tablet TAKE 1 TABLET BY MOUTH EVERY DAY (Patient taking differently: Take 20 mg by mouth at bedtime. ) 90 tablet 2  . furosemide (LASIX) 20 MG tablet Take 1 tablet by mouth daily for the next 3 days then as needed for swelling (Patient not taking: Reported on 12/28/2017) 15 tablet 0   No current facility-administered medications for this encounter.     Allergies  Allergen Reactions  . Diphenhydramine Hcl Hives and Other (See Comments)    Erratic heartbeat  . Flecainide Rash    Social History   Socioeconomic History  . Marital status: Married    Spouse name: Not on file  . Number of children: Not on file  . Years of education: Not on file  . Highest education level: Not on file  Occupational History  . Not on file  Social Needs  . Financial resource strain: Not hard at all  . Food insecurity:    Worry: Never true    Inability: Never true  . Transportation needs:    Medical: No  Non-medical: No  Tobacco Use  . Smoking status: Former Smoker    Packs/day: 0.25    Years: 10.00    Pack years: 2.50    Types: Cigarettes  . Smokeless tobacco: Never Used  . Tobacco comment: 11/09/2017 "quit smoking in the 1980s"  Substance and Sexual Activity  . Alcohol use: Never    Frequency: Never  . Drug use: Never  . Sexual activity: Not Currently  Lifestyle  . Physical activity:    Days per week: Not on file    Minutes per session: Not on file  . Stress: Not on file  Relationships  . Social connections:    Talks on phone: Not on file    Gets together: Not on file    Attends religious service: Not on file    Active member of club or  organization: Not on file    Attends meetings of clubs or organizations: Not on file    Relationship status: Not on file  . Intimate partner violence:    Fear of current or ex partner: Not on file    Emotionally abused: Not on file    Physically abused: Not on file    Forced sexual activity: Not on file  Other Topics Concern  . Not on file  Social History Narrative  . Not on file    Family History  Problem Relation Age of Onset  . CAD Father   . AAA (abdominal aortic aneurysm) Sister   . Thyroid disease Sister     ROS- All systems are reviewed and negative except as per the HPI above  Physical Exam: Vitals:   12/28/17 1516  BP: 118/82  Pulse: 65  Weight: 95.3 kg  Height: 5\' 5"  (1.651 m)   Wt Readings from Last 3 Encounters:  12/28/17 95.3 kg  12/19/17 95.3 kg  12/02/17 92.5 kg    Labs: Lab Results  Component Value Date   NA 140 11/01/2017   K 3.9 11/01/2017   CL 105 11/01/2017   CO2 24 11/01/2017   GLUCOSE 112 (H) 11/01/2017   BUN 17 11/01/2017   CREATININE 1.13 (H) 11/01/2017   CALCIUM 9.6 11/01/2017   PHOS 3.1 10/15/2017   MG 1.9 10/15/2017   Lab Results  Component Value Date   INR 1.14 10/30/2017   No results found for: CHOL, HDL, LDLCALC, TRIG   GEN- The patient is well appearing, alert and oriented x 3 today.   Head- normocephalic, atraumatic Eyes-  Sclera clear, conjunctiva pink Ears- hearing intact Oropharynx- clear Neck- supple, no JVP Lymph- no cervical lymphadenopathy Lungs- Clear to ausculation bilaterally, normal work of breathing Heart- Regular rate and rhythm, no murmurs, rubs or gallops, PMI not laterally displaced GI- soft, NT, ND, + BS Extremities- no clubbing, cyanosis, or edema MS- no significant deformity or atrophy Skin- no rash or lesion Psych- euthymic mood, full affect Neuro- strength and sensation are intact  EKG- Sinus rhythm with short PR, pr int 102 ms, qrs int 90 ms, qtc 438 ms    Assessment and Plan: 1.  Afib/flutter S/p ablation 10/2 In flutter when seen by Dr. Mayford Knifeurner 10/24 and cardizem was increased  but is now back in SR and is having some LLE  Decrease Cardizem back  to 180 mg bid Continue xarelto 20 mg a day, understands not to interrupt  therapy  F/u Dr. Elberta Fortisamnitz 1/3  Elvina Sidleonna C. Matthew Folksarroll, ANP-C [facility] [address] [place], [place] [address] [phone]

## 2017-12-29 ENCOUNTER — Other Ambulatory Visit (HOSPITAL_COMMUNITY): Payer: Self-pay | Admitting: *Deleted

## 2017-12-29 MED ORDER — FUROSEMIDE 20 MG PO TABS: ORAL_TABLET | ORAL | 2 refills | Status: DC

## 2018-01-20 ENCOUNTER — Ambulatory Visit: Payer: Medicare Other | Admitting: Cardiology

## 2018-02-10 ENCOUNTER — Ambulatory Visit: Payer: Medicare Other | Admitting: Cardiology

## 2018-02-16 ENCOUNTER — Other Ambulatory Visit (HOSPITAL_COMMUNITY): Payer: Self-pay | Admitting: Nurse Practitioner

## 2018-02-28 ENCOUNTER — Telehealth: Payer: Self-pay | Admitting: Cardiology

## 2018-02-28 NOTE — Telephone Encounter (Signed)
   Patient calling the office for samples of medication:   1.  What medication and dosage are you requesting samples for? XARELTO 20 MG TABS tablet  2.  Are you currently out of this medication? One dose left  Patient cannot afford copay and sees Dr [name] on 03/10/18. She would like enough samples to make it to her appt day.

## 2018-02-28 NOTE — Telephone Encounter (Signed)
Samples left at front desk per request. Pt aware

## 2018-03-01 ENCOUNTER — Encounter: Payer: Self-pay | Admitting: *Deleted

## 2018-03-10 ENCOUNTER — Ambulatory Visit (INDEPENDENT_AMBULATORY_CARE_PROVIDER_SITE_OTHER): Payer: Medicare Other | Admitting: Cardiology

## 2018-03-10 ENCOUNTER — Encounter: Payer: Self-pay | Admitting: Cardiology

## 2018-03-10 VITALS — BP 130/80 | HR 69 | Ht 65.0 in | Wt 212.0 lb

## 2018-03-10 DIAGNOSIS — I480 Paroxysmal atrial fibrillation: Secondary | ICD-10-CM

## 2018-03-10 MED ORDER — DILTIAZEM HCL ER COATED BEADS 240 MG PO CP24: 240.0000 mg | ORAL_CAPSULE | Freq: Every day | ORAL | 2 refills | Status: DC

## 2018-03-10 NOTE — Progress Notes (Signed)
Electrophysiology Office Note   Date:  03/10/2018   ID:  Mary Poole, DOB 09/01/1950, MRN [id number]  PCP:  Everrett CoombeMatthews, Cody, DO  Cardiologist:  Katrinka BlazingSmith Primary Electrophysiologist:  Dr Elberta Fortisamnitz    No chief complaint on file.    History of Present Illness: Mary Poole is a 68 y.o. female who is being seen today for the evaluation of atrial flutter at the request of Everrett CoombeMatthews, Cody, DO. Presenting today for electrophysiology evaluation. She has a history of hypertension and palpitations. She was diagnosed with atrial fibrillation on 07/18/16. She had 2 episodes of chest pressure with radiation to her arm in late May or early June. It lasted 20 minutes. EMS was called but she felt better. 10 days later, she had similar symptoms.  Went to the emergency room where she had a wide-complex tachycardia.  While in the emergency room, her rate slowed and showed a likely atrial flutter.  It was thought that this was the same rhythm with the wide-complex tachycardia being apparent 1-1 conduction.  She reverted to sinus rhythm with IV medications.  She is now s/p afib/flutter ablation 11/09/17. She did have one episode of heart racing about one week post op and her diltiazem was increased to 240 mg daily. She was in SR on follow up 12/02/17 and has not had any further episodes. She denies chest pain or difficulty swallowing.   Today, denies symptoms of palpitations, chest pain, shortness of breath, orthopnea, PND, claudication, dizziness, presyncope, syncope, bleeding, or neurologic sequela. The patient is tolerating medications without difficulties.    Past Medical History:  Diagnosis Date  . Anxiety   . Atrial fibrillation (HCC)   . Complication of anesthesia    "woke up during colonoscopy 02/2017" (11/09/2017)  . GERD (gastroesophageal reflux disease)   . H/O atrial flutter   . History of blood transfusion 02/2017   LGIB (11/09/2017)  . Hypertension   . Migraine    "a few/year" (11/09/2017)  . OSA  on CPAP    Past Surgical History:  Procedure Laterality Date  . ATRIAL FIBRILLATION ABLATION N/A 11/09/2017   Procedure: ATRIAL FIBRILLATION ABLATION;  Surgeon: Regan Lemmingamnitz, Will Martin, MD;  Location: [facility];  Service: Cardiovascular;  Laterality: N/A;  . COLONOSCOPY N/A 02/24/2017   Procedure: COLONOSCOPY;  Surgeon: Jeani HawkingHung, Patrick, MD;  [facility];  Service: Gastroenterology;  Laterality: N/A;  . ENTEROSCOPY N/A 02/24/2017   Procedure: ENTEROSCOPY;  Surgeon: Jeani HawkingHung, Patrick, MD;  [facility];  Service: Gastroenterology;  Laterality: N/A;  . HERNIA REPAIR  1960s  . WISDOM TOOTH EXTRACTION       Current Outpatient Medications  Medication Sig Dispense Refill  . carvedilol (COREG) 25 MG tablet Take 1 tablet (25 mg total) by mouth 2 (two) times daily. 180 tablet 3  . diltiazem (CARDIZEM CD) 180 MG 24 hr capsule TAKE 1 CAPSULE (180 MG TOTAL) BY MOUTH 2 (TWO) TIMES DAILY. 180 capsule 1  . furosemide (LASIX) 20 MG tablet Take 1 tablet by mouth daily for the next 3 days then as needed for swelling 20 tablet 2  . Multiple Vitamin (MULTIVITAMIN) LIQD Take 30 mLs by mouth daily.    . pantoprazole (PROTONIX) 40 MG tablet Take 1 tablet (40 mg total) by mouth daily. 90 tablet 1  . PRESCRIPTION MEDICATION Inhale into the lungs at bedtime. CPAP    . XARELTO 20 MG TABS tablet TAKE 1 TABLET BY MOUTH EVERY DAY (Patient taking differently: Take 20 mg by mouth at  bedtime. ) 90 tablet 2   No current facility-administered medications for this visit.     Allergies:   Diphenhydramine hcl and Flecainide   Social History:  The patient  reports that she has quit smoking. Her smoking use included cigarettes. She has a 2.50 pack-year smoking history. She has never used smokeless tobacco. She reports that she does not drink alcohol or use drugs.   Family History:  The patient's family history includes AAA (abdominal aortic aneurysm) in her sister; Allergies in an other family member; CAD  in her father; Clotting disorder in an other family member; Heart disease in an other family member; Hypertension in an other family member; Migraines in an other family member; Rheum arthritis in an other family member; Thyroid disease in her sister.   ROS:  Please see the history of present illness.   Otherwise, review of systems is positive for snoring, skipped beats, edema.   All other systems are reviewed and negative.   PHYSICAL EXAM: VS:  There were no vitals taken for this visit. , BMI There is no height or weight on file to calculate BMI. GEN: Well nourished, well developed, in no acute distress  HEENT: normal  Neck: no JVD, carotid bruits, or masses Cardiac: RRR; no murmurs, rubs, or gallops, trace edema  Respiratory:  clear to auscultation bilaterally, normal work of breathing GI: soft, nontender, nondistended, + BS MS: no deformity or atrophy  Skin: warm and dry Neuro:  Strength and sensation are intact Psych: euthymic mood, full affect  EKG:  EKG is ordered today. Personal review of the ekg ordered shows SR HR 69, NST   Recent Labs: 10/15/2017: Magnesium 1.9; TSH 0.452 10/30/2017: ALT 23 11/01/2017: BUN 17; Creatinine, Ser 1.13; Hemoglobin 12.7; Platelets 268; Potassium 3.9; Sodium 140    Lipid Panel  No results found for: CHOL, TRIG, HDL, CHOLHDL, VLDL, LDLCALC, LDLDIRECT   Wt Readings from Last 3 Encounters:  12/28/17 210 lb (95.3 kg)  12/19/17 210 lb (95.3 kg)  12/02/17 204 lb (92.5 kg)      Other studies Reviewed: Additional studies/ records that were reviewed today include: Myoview 10/06/16  Review of the above records today demonstrates:   Nuclear stress EF: 58%. The left ventricular ejection fraction is normal (55-65%).  The study is normal. There is breast attenuation. No ischemia . no evidence of infarction  This is a low risk study.  Holter 10/09/16 - personally reviewed  Basic rhythm is NSR rare PAC's and PVC's.  Brief SVT < 10 beats.  Heart rate  range 43-113 bpm with average 63 bpm.   NSR Normal study  TTE 09/18/17 - Left ventricle: The cavity size was normal. Wall thickness was   increased in a pattern of mild LVH. Systolic function was normal.   The estimated ejection fraction was in the range of 55% to 60%.   Wall motion was normal; there were no regional wall motion   abnormalities. Doppler parameters are consistent with restrictive   physiology, indicative of decreased left ventricular diastolic   compliance and/or increased left atrial pressure. - Mitral valve: Calcified annulus. There was moderate   regurgitation. - Left atrium: The atrium was severely dilated. - Atrial septum: The septum bowed from left to right, consistent   with increased left atrial pressure. - Pulmonary arteries: Systolic pressure was moderately increased.   PA peak pressure: 52 mm Hg (S).    ASSESSMENT AND PLAN:  1.  Paroxysmal atrial fibrillation/typical atrial flutter: S/p atrial flutter ablation  11/09/17. She on Xarelto, carvedilol, and diltiazem. She has done well post ablation with only one episode. She is in SR today. She is happy with the resutls of her procedure. She feels she is having more swelling and "leg heaviness" on higher dose of CCB. Will decrease diltiazem to 240 mg daily. Continue Xarelto 20 mg daily.  This patients CHA2DS2-VASc Score and unadjusted Ischemic Stroke Rate (% per year) is equal to 3.2 % stroke rate/year from a score of 3  Above score calculated as 1 point each if present [CHF, HTN, DM, Vascular=MI/PAD/Aortic Plaque, Age if 65-74, or Female] Above score calculated as 2 points each if present [Age > 75, or Stroke/TIA/TE]  2. Short PR interval: No accessory pathways on EP study. No delta waves.  3. Hypertension: Stable, changes as above  4. OSA: Compliant with CPAP therapy.    Current medicines are reviewed at length with the patient today.   The patient does not have concerns regarding her medicines.  The  following changes were made today: decrease diltiazem  Labs/ tests ordered today include:  Orders Placed This Encounter  Procedures  . EKG 12-Lead     Disposition:   FU with Will Camnitz 3 months  Sherrie MustacheSigned, Mak Bonny R Melony Tenpas, [place]  03/10/2018 9:11 AM     [facility] [address] [place] [address] [phone] (office) [phone] (fax)   I have seen and examined this patient with Jorja Loaicky Lavaughn Haberle.  Agree with above, note added to reflect my findings.  On exam, RRR, no murmurs, lungs clear.  Status post atrial fibrillation ablation.  She has had minimal atrial fibrillation over the last 3 months.  She is feeling well today.  She is feeling somewhat weak and fatigued with high doses of diltiazem.  We will stop her evening dose.  Will M. Camnitz MD 03/11/2018 7:56 AM

## 2018-03-10 NOTE — Patient Instructions (Addendum)
Medication Instructions:  Your physician has recommended you make the following change in your medication:  1. CHANGE THE WAY YOU TAKE DILTIAZEM -- take 240 mg once daily   * If you need a refill on your cardiac medications before your next appointment, please call your pharmacy.   Labwork: None ordered  Testing/Procedures: None ordered  Follow-Up: Your physician recommends that you schedule a follow-up appointment in: 3 months with Dr. Elberta Fortisamnitz.   Thank you for choosing [facility]!!   Dory HornSherri Price, RN [phone]

## 2018-03-11 ENCOUNTER — Encounter: Payer: Self-pay | Admitting: Cardiology

## 2018-03-28 DIAGNOSIS — G4733 Obstructive sleep apnea (adult) (pediatric): Secondary | ICD-10-CM | POA: Diagnosis not present

## 2018-04-04 DIAGNOSIS — I10 Essential (primary) hypertension: Secondary | ICD-10-CM | POA: Diagnosis not present

## 2018-04-04 DIAGNOSIS — G4733 Obstructive sleep apnea (adult) (pediatric): Secondary | ICD-10-CM | POA: Diagnosis not present

## 2018-04-26 DIAGNOSIS — G4733 Obstructive sleep apnea (adult) (pediatric): Secondary | ICD-10-CM | POA: Diagnosis not present

## 2018-05-01 ENCOUNTER — Telehealth: Payer: Self-pay | Admitting: Family Medicine

## 2018-05-01 ENCOUNTER — Other Ambulatory Visit: Payer: Self-pay | Admitting: Family Medicine

## 2018-05-01 MED ORDER — OMEPRAZOLE 40 MG PO CPDR: 40.0000 mg | DELAYED_RELEASE_CAPSULE | Freq: Every day | ORAL | 3 refills | Status: DC

## 2018-05-01 NOTE — Telephone Encounter (Signed)
rx changed to prilosec

## 2018-05-01 NOTE — Telephone Encounter (Signed)
Patient called and stated that the pantoprazole is not working for her and she would like to be switched to another medication. She said that Prilosec worked for her when receiving that prescription. Please advise patient.

## 2018-05-17 ENCOUNTER — Other Ambulatory Visit: Payer: Self-pay | Admitting: Interventional Cardiology

## 2018-05-17 NOTE — Telephone Encounter (Signed)
Pt last saw Dr [name] on 03/10/18, last labs 11/01/17 Creat 1.13, age 68, weight 96.2kg, CrCl 73.37, based on CrCl pt is on appropriate dosage of Xarelto 20mg  QD.  Will refill rx.

## 2018-05-23 ENCOUNTER — Telehealth (HOSPITAL_COMMUNITY): Payer: Self-pay | Admitting: *Deleted

## 2018-05-23 NOTE — Telephone Encounter (Signed)
Patient called in stating she had an episode of AF that last a few minutes and then she converted back into NSR. Since then for the last 2 days she has noticed her hands/feet are more warm and more of a normal color. She has also noticed her HR has been in the 60s instead of the 50s and she actually feels better. Discussed with Rudi Coco NP - stated sounds to be characteristic of increased perfusion. Keep track of HRs if notices trend of HR back in the 50s and coolness of extremities returns to discuss possible decrease in cardizem at appt with Dr. [name] in end of April. Pt in agreement and will continue to monitor her HRs.

## 2018-05-27 DIAGNOSIS — G4733 Obstructive sleep apnea (adult) (pediatric): Secondary | ICD-10-CM | POA: Diagnosis not present

## 2018-05-31 ENCOUNTER — Telehealth: Payer: Self-pay | Admitting: *Deleted

## 2018-05-31 NOTE — Telephone Encounter (Signed)
Calling patient today to discuss upcoming appointment.  We are currently trying to limit exposure to the virus that causes COVID-19 by seeing patients at home rather than in the office. We would like to schedule this appointment as a Virtual Appointment VIA Smartphone or Laptop. Unable to reach patient.  Phone rings then hangs up  

## 2018-06-05 NOTE — Telephone Encounter (Signed)
Virtual Visit Pre-Appointment Phone Call  "(Name), I am calling you today to discuss your upcoming appointment. We are currently trying to limit exposure to the virus that causes COVID-19 by seeing patients at home rather than in the office."  1. "What is the BEST phone number to call the day of the visit?" - include this in appointment notes  2. "Do you have or have access to (through a family member/friend) a smartphone with video capability that we can use for your visit?" a. If yes - list this number in appt notes as "cell" (if different from BEST phone #) and list the appointment type as a VIDEO visit in appointment notes b. If no - list the appointment type as a PHONE visit in appointment notes  3. Confirm consent - "In the setting of the current Covid19 crisis, you are scheduled for a (phone or video) visit with your provider on (date) at (time).  Just as we do with many in-office visits, in order for you to participate in this visit, we must obtain consent.  If you'd like, I can send this to your mychart (if signed up) or email for you to review.  Otherwise, I can obtain your verbal consent now.  All virtual visits are billed to your insurance company just like a normal visit would be.  By agreeing to a virtual visit, we'd like you to understand that the technology does not allow for your provider to perform an examination, and thus may limit your provider's ability to fully assess your condition. If your provider identifies any concerns that need to be evaluated in person, we will make arrangements to do so.  Finally, though the technology is pretty good, we cannot assure that it will always work on either your or our end, and in the setting of a video visit, we may have to convert it to a phone-only visit.  In either situation, we cannot ensure that we have a secure connection.  Are you willing to proceed?" STAFF: Did the patient verbally acknowledge consent to telehealth visit? Document  YES/NO here: YES  4. Advise patient to be prepared - "Two hours prior to your appointment, go ahead and check your blood pressure, pulse, oxygen saturation, and your weight (if you have the equipment to check those) and write them all down. When your visit starts, your provider will ask you for this information. If you have an Apple Watch or Kardia device, please plan to have heart rate information ready on the day of your appointment. Please have a pen and paper handy nearby the day of the visit as well."  5. Give patient instructions for MyChart download to smartphone OR Doximity/Doxy.me as below if video visit (depending on what platform provider is using)  6. Inform patient they will receive a phone call 15 minutes prior to their appointment time (may be from unknown caller ID) so they should be prepared to answer    TELEPHONE CALL NOTE  Mary Poole has been deemed a candidate for a follow-up tele-health visit to limit community exposure during the Covid-19 pandemic. I spoke with the patient via phone to ensure availability of phone/video source, confirm preferred email & phone number, and discuss instructions and expectations.  I reminded Mary Poole to be prepared with any vital sign and/or heart rhythm information that could potentially be obtained via home monitoring, at the time of her visit. I reminded Mary Poole to expect a phone call prior to  her visit.  Baird LyonsPrice, Azaryah Heathcock L, RN 06/05/2018 12:41 PM   INSTRUCTIONS FOR DOWNLOADING THE MYCHART APP TO SMARTPHONE  - The patient must first make sure to have activated MyChart and know their login information - If Apple, go to [employer] Store and type in MyChart in the search bar and download the app. If Android, ask patient to go to [employer] Play Store and type in [place] in the search bar and download the app. The app is free but as with any other app downloads, their phone may require them to verify saved payment information or Apple/Android  password.  - The patient will need to then log into the app with their MyChart username and password, and select [facility] as their healthcare provider to link the account. When it is time for your visit, go to the MyChart app, find appointments, and click Begin Video Visit. Be sure to Select Allow for your device to access the Microphone and Camera for your visit. You will then be connected, and your provider will be with you shortly.  **If they have any issues connecting, or need assistance please contact MyChart service desk [phone])**  **If using a computer, in order to ensure the best quality for their visit they will need to use either of the following Internet Browsers: [employer] Edge, or Google Chrome**  IF USING DOXIMITY or DOXY.ME - The patient will receive a link just prior to their visit by text.     FULL LENGTH CONSENT FOR TELE-HEALTH VISIT   I hereby voluntarily request, consent and authorize [facility] and its employed or contracted physicians, physician assistants, nurse practitioners or other licensed health care professionals (the Practitioner), to provide me with telemedicine health care services (the "Services") as deemed necessary by the treating Practitioner. I acknowledge and consent to receive the Services by the Practitioner via telemedicine. I understand that the telemedicine visit will involve communicating with the Practitioner through live audiovisual communication technology and the disclosure of certain medical information by electronic transmission. I acknowledge that I have been given the opportunity to request an in-person assessment or other available alternative prior to the telemedicine visit and am voluntarily participating in the telemedicine visit.  I understand that I have the right to withhold or withdraw my consent to the use of telemedicine in the course of my care at any time, without affecting my right to future care or treatment,  and that the Practitioner or I may terminate the telemedicine visit at any time. I understand that I have the right to inspect all information obtained and/or recorded in the course of the telemedicine visit and may receive copies of available information for a reasonable fee.  I understand that some of the potential risks of receiving the Services via telemedicine include:  Marland Kitchen. Delay or interruption in medical evaluation due to technological equipment failure or disruption; . Information transmitted may not be sufficient (e.g. poor resolution of images) to allow for appropriate medical decision making by the Practitioner; and/or  . In rare instances, security protocols could fail, causing a breach of personal health information.  Furthermore, I acknowledge that it is my responsibility to provide information about my medical history, conditions and care that is complete and accurate to the best of my ability. I acknowledge that Practitioner's advice, recommendations, and/or decision may be based on factors not within their control, such as incomplete or inaccurate data provided by me or distortions of diagnostic images or specimens that may result from electronic transmissions.  I understand that the practice of medicine is not an exact science and that Practitioner makes no warranties or guarantees regarding treatment outcomes. I acknowledge that I will receive a copy of this consent concurrently upon execution via email to the email address I last provided but may also request a printed copy by calling the office of [place].    I understand that my insurance will be billed for this visit.   I have read or had this consent read to me. . I understand the contents of this consent, which adequately explains the benefits and risks of the Services being provided via telemedicine.  . I have been provided ample opportunity to ask questions regarding this consent and the Services and have had my questions  answered to my satisfaction. . I give my informed consent for the services to be provided through the use of telemedicine in my medical care  By participating in this telemedicine visit I agree to the above.

## 2018-06-06 ENCOUNTER — Telehealth (INDEPENDENT_AMBULATORY_CARE_PROVIDER_SITE_OTHER): Payer: Medicare Other | Admitting: Cardiology

## 2018-06-06 ENCOUNTER — Other Ambulatory Visit: Payer: Self-pay

## 2018-06-06 ENCOUNTER — Encounter: Payer: Self-pay | Admitting: Cardiology

## 2018-06-06 DIAGNOSIS — I480 Paroxysmal atrial fibrillation: Secondary | ICD-10-CM

## 2018-06-06 NOTE — Progress Notes (Signed)
Electrophysiology TeleHealth Note   Due to national recommendations of social distancing due to COVID 19, an audio/video telehealth visit is felt to be most appropriate for this patient at this time.  See Epic message for the patient's consent to telehealth for [facility].   Date:  06/06/2018   ID:  Mary Poole, DOB [birth year], MRN [id number]  Location: patient's home  Provider location: [address], [place]  Evaluation Performed: Follow-up visit  PCP:  Everrett Coombe, DO  Cardiologist:  No primary care provider on file.  Electrophysiologist:  Dr [name]  Chief Complaint:  Atrial fibrillation  History of Present Illness:    Mary Poole is a 68 y.o. female who presents via audio/video conferencing for a telehealth visit today.  Since last being seen in our clinic, the patient reports doing very well.  Today, she denies symptoms of palpitations, chest pain, shortness of breath,  lower extremity edema, dizziness, presyncope, or syncope.  The patient is otherwise without complaint today.  The patient denies symptoms of fevers, chills, cough, or new SOB worrisome for COVID 19.  She has a history significant for hypertension, OSA on CPAP, and atrial fibrillation/flutter status post ablation 11/10/2018.  Today, denies symptoms of palpitations, chest pain, shortness of breath, orthopnea, PND, lower extremity edema, claudication, dizziness, presyncope, syncope, bleeding, or neurologic sequela. The patient is tolerating medications without difficulties. She is overall feeling well today.  She has no chest pain or shortness of breath.  A few weeks ago she did have an episode of atrial fibrillation that lasted for approximately 6 hours.  She got warm and flushed, but converted on her own.  Aside from that she is done well without major complaint.   Past Medical History:  Diagnosis Date  . Anxiety   . Atrial fibrillation (HCC)   . Complication of anesthesia    "woke up  during colonoscopy 02/2017" (11/09/2017)  . GERD (gastroesophageal reflux disease)   . H/O atrial flutter   . History of blood transfusion 02/2017   LGIB (11/09/2017)  . Hypertension   . Migraine    "a few/year" (11/09/2017)  . OSA on CPAP     Past Surgical History:  Procedure Laterality Date  . ATRIAL FIBRILLATION ABLATION N/A 11/09/2017   Procedure: ATRIAL FIBRILLATION ABLATION;  Surgeon: Regan Lemming, MD;  Location: [facility] INVASIVE CV LAB;  Service: Cardiovascular;  Laterality: N/A;  . COLONOSCOPY N/A 02/24/2017   Procedure: COLONOSCOPY;  Surgeon: Jeani Hawking, MD;  Location[facility] ENDOSCOPY;  Service: Gastroenterology;  Laterality: N/A;  . ENTEROSCOPY N/A 02/24/2017   Procedure: ENTEROSCOPY;  Surgeon: Jeani Hawking, MD;  Location: [facility] ENDOSCOPY;  Service: Gastroenterology;  Laterality: N/A;  . HERNIA REPAIR  1960s  . WISDOM TOOTH EXTRACTION      Current Outpatient Medications  Medication Sig Dispense Refill  . carvedilol (COREG) 25 MG tablet Take 1 tablet (25 mg total) by mouth 2 (two) times daily. 180 tablet 3  . diltiazem (CARDIZEM CD) 240 MG 24 hr capsule Take 1 capsule (240 mg total) by mouth daily. 90 capsule 2  . furosemide (LASIX) 20 MG tablet Take 1 tablet by mouth daily for the next 3 days then as needed for swelling 20 tablet 2  . Multiple Vitamin (MULTIVITAMIN) LIQD Take 30 mLs by mouth daily.    Marland Kitchen omeprazole (PRILOSEC) 40 MG capsule Take 1 capsule (40 mg total) by mouth daily. 30 capsule 3  . PRESCRIPTION MEDICATION Inhale into the lungs at bedtime.  CPAP    . XARELTO 20 MG TABS tablet TAKE 1 TABLET BY MOUTH EVERY DAY 90 tablet 1   No current facility-administered medications for this visit.     Allergies:   Diphenhydramine hcl and Flecainide   Social History:  The patient  reports that she has quit smoking. Her smoking use included cigarettes. She has a 2.50 pack-year smoking history. She has never used smokeless tobacco. She reports that she does not drink alcohol or  use drugs.   Family History:  The patient's  family history includes AAA (abdominal aortic aneurysm) in her sister; Allergies in an other family member; CAD in her father; Clotting disorder in an other family member; Heart disease in an other family member; Hypertension in an other family member; Migraines in an other family member; Rheum arthritis in an other family member; Thyroid disease in her sister.   ROS:  Please see the history of present illness.   All other systems are personally reviewed and negative.    Exam:    Vital Signs:  There were no vitals taken for this visit.  Well appearing, alert and conversant, regular work of breathing,  good skin color Eyes- anicteric, neuro- grossly intact, skin- no apparent rash or lesions or cyanosis, mouth- oral mucosa is pink   Labs/Other Tests and Data Reviewed:    Recent Labs: 10/15/2017: Magnesium 1.9; TSH 0.452 10/30/2017: ALT 23 11/01/2017: BUN 17; Creatinine, Ser 1.13; Hemoglobin 12.7; Platelets 268; Potassium 3.9; Sodium 140   Wt Readings from Last 3 Encounters:  03/10/18 212 lb (96.2 kg)  12/28/17 210 lb (95.3 kg)  12/19/17 210 lb (95.3 kg)     Other studies personally reviewed: Additional studies/ records that were reviewed today include: ECG 03/10/2018 personally reviewed  Review of the above records today demonstrates:  Sinus rhythm   ASSESSMENT & PLAN:    1.  Paroxysmal atrial fibrillation/flutter: Status post ablation 11/09/2017.  Currently on Xarelto, Coreg, diltiazem. She has had one further episode of atrial fibrillation, fortunately she converted on her own.  I have told her that if she has further episodes, she may benefit from antiarrhythmic medications.  She did not have coronary disease and her ejection fraction is normal.  Would likely benefit from flecainide.  This patients CHA2DS2-VASc Score and unadjusted Ischemic Stroke Rate (% per year) is equal to 3.2 % stroke rate/year from a score of 3  Above score  calculated as 1 point each if present [CHF, HTN, DM, Vascular=MI/PAD/Aortic Plaque, Age if 65-74, or Female] Above score calculated as 2 points each if present [Age > 75, or Stroke/TIA/TE]  2.  Hypertension: Mildly elevated today but has been well controlled in the past.  No changes.  3.  OSA: CPAP compliance encouraged   COVID 19 screen The patient denies symptoms of COVID 19 at this time.  The importance of social distancing was discussed today.  Follow-up: 6 months   Current medicines are reviewed at length with the patient today.   The patient does not have concerns regarding her medicines.  The following changes were made today:  none  Labs/ tests ordered today include:  No orders of the defined types were placed in this encounter.    Patient Risk:  after full review of this patients clinical status, I feel that they are at moderate risk at this time.  Today, I have spent 12 minutes with the patient with telehealth technology discussing atrial fibrillation.    Signed, Charley Miske Jorja Loa, MD  06/06/2018  1:44 PM     [facility] [address] [place] [id number] [phone] (office) [phone] (fax)

## 2018-06-12 ENCOUNTER — Telehealth: Payer: Self-pay | Admitting: Cardiology

## 2018-06-12 MED ORDER — DILTIAZEM HCL ER COATED BEADS 120 MG PO CP24: 120.0000 mg | ORAL_CAPSULE | Freq: Every day | ORAL | 6 refills | Status: DC

## 2018-06-12 NOTE — Telephone Encounter (Signed)
Advised to decrease Diltiazem to 120 mg once daily, per Dr. [name] Pt will continue to monitor hair loss after decreasing Diltiazem.  If hair loss does not improve in a week or so pt will call the office back to discuss  If stopping Carvedilol is needed.

## 2018-06-12 NOTE — Telephone Encounter (Signed)
Pt c/o medication issue:  1. Name of Medication: diltiazem (CARDIZEM CD) 240 MG 24 hr capsule  2. How are you currently taking this medication (dosage and times per day)?   3. Are you having a reaction (difficulty breathing--STAT)?   4. What is your medication issue? Lightheaded, tired all the time, weak legs, hair loss.

## 2018-06-14 ENCOUNTER — Telehealth: Payer: Self-pay | Admitting: Cardiology

## 2018-06-14 NOTE — Telephone Encounter (Signed)
New message     Virtual video visit scheduled for 06-19-18.  Pt recently had virtual visit with EP doctor; consent still good.  Patient was told to have recent bp reading available for nurse.

## 2018-06-19 ENCOUNTER — Other Ambulatory Visit: Payer: Self-pay

## 2018-06-19 ENCOUNTER — Telehealth (INDEPENDENT_AMBULATORY_CARE_PROVIDER_SITE_OTHER): Payer: Medicare Other | Admitting: Cardiology

## 2018-06-19 ENCOUNTER — Encounter: Payer: Self-pay | Admitting: Cardiology

## 2018-06-19 VITALS — BP 151/73 | HR 57 | Ht 65.0 in | Wt 202.0 lb

## 2018-06-19 DIAGNOSIS — I480 Paroxysmal atrial fibrillation: Secondary | ICD-10-CM | POA: Diagnosis not present

## 2018-06-19 DIAGNOSIS — I10 Essential (primary) hypertension: Secondary | ICD-10-CM

## 2018-06-19 DIAGNOSIS — L658 Other specified nonscarring hair loss: Secondary | ICD-10-CM

## 2018-06-19 DIAGNOSIS — Z7901 Long term (current) use of anticoagulants: Secondary | ICD-10-CM

## 2018-06-19 NOTE — Progress Notes (Signed)
Virtual Visit via Video Note   This visit type was conducted due to national recommendations for restrictions regarding the COVID-19 Pandemic (e.g. social distancing) in an effort to limit this patient's exposure and mitigate transmission in our community.  Due to her co-morbid illnesses, this patient is at least at moderate risk for complications without adequate follow up.  This format is felt to be most appropriate for this patient at this time.  All issues noted in this document were discussed and addressed.  A limited physical exam was performed with this format.  Please refer to the patient's chart for her consent to telehealth for [facility].   Date:  06/19/2018   ID:  Mary Poole, DOB 02/24/1950, MRN [id number]  Patient Location: Home Provider location:  OFFICE  PCP:  Everrett CoombeMatthews, Cody, DO  Cardiologist:  No primary care provider on file. N/A Electrophysiologist:  Will Jorja LoaMartin Camnitz, MD   Evaluation Performed:  Follow-Up Visit  Chief Complaint:  Hair loss  History of Present Illness:    Mary Poole is a 68 y.o. female with history significant for hypertension, OSA on CPAP, and atrial fibrillation/flutter status post ablation 11/10/2018.  CHA2DS2Vasc of 3   On last visit if she were to have further a fib then flecainide would be choice. since then she called complaining of lightheadedness and fatigue and hair loss. Her dilt was decreased to 120 mg  She is on coreg 25 BID.  Last TSH sept was normal.    Today no chest pain or SOB.  No a fib that she is aware of.  But her hair is falling out a lot and she is now wearing a wig.  She has FH of hair loss but not to this severity.  She has some mild tremors in addition which reminds her of when she had acute Grave's disease.  She contacted her PCP and will see tomorrow for complete thyroid panel.  She is on her xarelto without problems.    The patient does not have symptoms concerning for COVID-19 infection (fever, chills, cough,  or new shortness of breath).    Past Medical History:  Diagnosis Date  . Anxiety   . Atrial fibrillation (HCC)   . Complication of anesthesia    "woke up during colonoscopy 02/2017" (11/09/2017)  . GERD (gastroesophageal reflux disease)   . H/O atrial flutter   . History of blood transfusion 02/2017   LGIB (11/09/2017)  . Hypertension   . Migraine    "a few/year" (11/09/2017)  . OSA on CPAP    Past Surgical History:  Procedure Laterality Date  . ATRIAL FIBRILLATION ABLATION N/A 11/09/2017   Procedure: ATRIAL FIBRILLATION ABLATION;  Surgeon: Regan Lemmingamnitz, Will Martin, MD;  [facility];  Service: Cardiovascular;  Laterality: N/A;  . COLONOSCOPY N/A 02/24/2017   Procedure: COLONOSCOPY;  Surgeon: Jeani HawkingHung, Patrick, MD;  [facility];  Service: Gastroenterology;  Laterality: N/A;  . ENTEROSCOPY N/A 02/24/2017   Procedure: ENTEROSCOPY;  Surgeon: Jeani HawkingHung, Patrick, MD;  [facility];  Service: Gastroenterology;  Laterality: N/A;  . HERNIA REPAIR  1960s  . WISDOM TOOTH EXTRACTION       Current Meds  Medication Sig  . carvedilol (COREG) 25 MG tablet Take 1 tablet (25 mg total) by mouth 2 (two) times daily.  Marland Kitchen. diltiazem (CARDIZEM CD) 120 MG 24 hr capsule Take 1 capsule (120 mg total) by mouth daily.  . furosemide (LASIX) 20 MG tablet Take 1 tablet by mouth daily for  the next 3 days then as needed for swelling  . Multiple Vitamin (MULTIVITAMIN) LIQD Take 30 mLs by mouth daily.  Marland Kitchen omeprazole (PRILOSEC) 40 MG capsule Take 1 capsule (40 mg total) by mouth daily.  Marland Kitchen PRESCRIPTION MEDICATION Inhale into the lungs at bedtime. CPAP  . XARELTO 20 MG TABS tablet TAKE 1 TABLET BY MOUTH EVERY DAY     Allergies:   Diphenhydramine hcl and Flecainide   Social History   Tobacco Use  . Smoking status: Former Smoker    Packs/day: 0.25    Years: 10.00    Pack years: 2.50    Types: Cigarettes  . Smokeless tobacco: Never Used  . Tobacco comment: 11/09/2017 "quit smoking in the  1980s"  Substance Use Topics  . Alcohol use: Never    Frequency: Never  . Drug use: Never     Family Hx: The patient's family history includes AAA (abdominal aortic aneurysm) in her sister; Allergies in an other family member; CAD in her father; Clotting disorder in an other family member; Heart disease in an other family member; Hypertension in an other family member; Migraines in an other family member; Rheum arthritis in an other family member; Thyroid disease in her sister.  ROS:   Please see the history of present illness.    General:no colds or fevers, no weight changes Skin:no rashes or ulcers HEENT:no blurred vision, no congestion CV:see HPI PUL:see HPI GI:no diarrhea constipation or melena, no indigestion GU:no hematuria, no dysuria MS:no joint pain, no claudication Neuro:no syncope, + lightheadedness, occ tremor Endo:no diabetes, + thyroid disease  All other systems reviewed and are negative.   Prior CV studies:   The following studies were reviewed today:  Echo 09/18/17 Study Conclusions  - Left ventricle: The cavity size was normal. Wall thickness was   increased in a pattern of mild LVH. Systolic function was normal.   The estimated ejection fraction was in the range of 55% to 60%.   Wall motion was normal; there were no regional wall motion   abnormalities. Doppler parameters are consistent with restrictive   physiology, indicative of decreased left ventricular diastolic   compliance and/or increased left atrial pressure. - Mitral valve: Calcified annulus. There was moderate   regurgitation. - Left atrium: The atrium was severely dilated. - Atrial septum: The septum bowed from left to right, consistent   with increased left atrial pressure. - Pulmonary arteries: Systolic pressure was moderately increased.   PA peak pressure: 52 mm Hg (S).  Impressions:  - Normal LV function; mild LVH; restrictive filling; moderate MR;   severe LAE; mild TR with  moderate pulmonary hypertension.   Labs/Other Tests and Data Reviewed:    EKG:  An ECG dated 03/10/18 was personally reviewed today and demonstrated:  SR wiht ant infarction, but no acute changes.  Recent Labs: 10/15/2017: Magnesium 1.9; TSH 0.452 10/30/2017: ALT 23 11/01/2017: BUN 17; Creatinine, Ser 1.13; Hemoglobin 12.7; Platelets 268; Potassium 3.9; Sodium 140   Recent Lipid Panel No results found for: CHOL, TRIG, HDL, CHOLHDL, LDLCALC, LDLDIRECT  Wt Readings from Last 3 Encounters:  06/19/18 202 lb (91.6 kg)  03/10/18 212 lb (96.2 kg)  12/28/17 210 lb (95.3 kg)     Objective:    Vital Signs:  BP (!) 151/73   Pulse (!) 57   Ht  (1.651 m)   Wt 202 lb (91.6 kg)   BMI 33.61 kg/m    VITAL SIGNS:  reviewed  General female in no acute distress  Neuro A&O X 3 follows commands.  Walked to car.  Lungs no SOB, no audible wheezes Psych:  Pleasant affect.   ASSESSMENT & PLAN:    1. Lightheadedness dilt decreased --still with hair loss.  Will stop dilt she is aware to monitor for a fib and elevated BP.   2. HTN elevated today but 112/73 last pm will monitor 3. Hair loss, significant.will stop dilt if no change resume and stop BB, she has tried flecainide in the past and did not do well.  COVID-19 Education: The signs and symptoms of COVID-19 were discussed with the patient and how to seek care for testing (follow up with PCP or arrange E-visit).  The importance of social distancing was discussed today.  Time:   Today, I have spent 10 minutes with the patient with telehealth technology discussing the above problems.     Medication Adjustments/Labs and Tests Ordered: Current medicines are reviewed at length with the patient today.  Concerns regarding medicines are outlined above.   Tests Ordered: No orders of the defined types were placed in this encounter.   Medication Changes: No orders of the defined types were placed in this encounter.   Disposition:  Follow up in  2 week(s)  Signed, Nada Boozer, NP  06/19/2018 1:28 PM    [facility]

## 2018-06-19 NOTE — Patient Instructions (Addendum)
Medication Instructions:  Your physician has recommended you make the following change in your medication:  1.  STOP Diltiazem   If you need a refill on your cardiac medications before your next appointment, please call your pharmacy.   Lab work: None ordered  If you have labs (blood work) drawn today and your tests are completely normal, you will receive your results only by: Marland Kitchen MyChart Message (if you have MyChart) OR . A paper copy in the mail If you have any lab test that is abnormal or we need to change your treatment, we will call you to review the results.  Testing/Procedures: None ordered  Follow-Up: You are scheduled for another virtual video visit 07/05/2018 at 11:00 with Nada Boozer, NP  Any Other Special Instructions Will Be Listed Below (If Applicable).

## 2018-06-20 ENCOUNTER — Encounter: Payer: Self-pay | Admitting: Family Medicine

## 2018-06-20 ENCOUNTER — Telehealth (INDEPENDENT_AMBULATORY_CARE_PROVIDER_SITE_OTHER): Payer: Medicare Other | Admitting: Family Medicine

## 2018-06-20 ENCOUNTER — Other Ambulatory Visit: Payer: Medicare Other

## 2018-06-20 VITALS — BP 144/86 | HR 74 | Ht 65.0 in | Wt 202.0 lb

## 2018-06-20 DIAGNOSIS — E079 Disorder of thyroid, unspecified: Secondary | ICD-10-CM

## 2018-06-20 NOTE — Addendum Note (Signed)
Addended by: Arva Chafe on: 06/20/2018 02:03 PM   Modules accepted: Orders

## 2018-06-20 NOTE — Assessment & Plan Note (Signed)
-  Update thyroid function tests today.  -Symptoms likely related to medication given timing of onset.  -Hopefully she'll have improvement with d/c of diltiazem.

## 2018-06-20 NOTE — Progress Notes (Signed)
Mary ServeSandra J Cranford - 68 y.o. female MRN [id number]  Date of birth: 02/09/1950   This visit type was conducted due to national recommendations for restrictions regarding the COVID-19 Pandemic (e.g. social distancing).  This format is felt to be most appropriate for this patient at this time.  All issues noted in this document were discussed and addressed.  No physical exam was performed (except for noted visual exam findings with Video Visits).  I discussed the limitations of evaluation and management by telemedicine and the availability of in person appointments. The patient expressed understanding and agreed to proceed.  I connected with@ on 06/20/18 at  1:30 PM EDT by a video enabled telemedicine application and verified that I am speaking with the correct person using two identifiers.   [place] [facility] [address] [place] [id number]   Provider location:   Home office  Chief Complaint  Patient presents with  . Alopecia    Onset : Dec/Jan, weakness in legs,tremors in hand,New Meds-    HPI  Mary Poole is a 68 y.o. female who presents via audio/video conferencing for a telehealth visit today.  She has concerns about possible worsening of her Grave's disease again.  Reports she has had worsening hair loss, weakness and tremors in legs for that past few months.  Reports that she was started on new medication (diltiazem) for a. Fib about the time this all started as well. She has already had a visit with her cardiologist earlier today and diltiazem has been d/c'ed.  She denies increased palpitations, fatigue, leg swelling, weight loss or gain.      ROS:  A comprehensive ROS was completed and negative except as noted per HPI  Past Medical History:  Diagnosis Date  . Anxiety   . Atrial fibrillation (HCC)   . Complication of anesthesia    "woke up during colonoscopy 02/2017" (11/09/2017)  . GERD (gastroesophageal reflux disease)   . H/O atrial flutter   . History of blood  transfusion 02/2017   LGIB (11/09/2017)  . Hypertension   . Migraine    "a few/year" (11/09/2017)  . OSA on CPAP     Past Surgical History:  Procedure Laterality Date  . ATRIAL FIBRILLATION ABLATION N/A 11/09/2017   Procedure: ATRIAL FIBRILLATION ABLATION;  Surgeon: Regan Lemmingamnitz, Will Martin, MD;  Location: [facility];  Service: Cardiovascular;  Laterality: N/A;  . COLONOSCOPY N/A 02/24/2017   Procedure: COLONOSCOPY;  Surgeon: Jeani HawkingHung, Patrick, MD;  [facility];  Service: Gastroenterology;  Laterality: N/A;  . ENTEROSCOPY N/A 02/24/2017   Procedure: ENTEROSCOPY;  Surgeon: Jeani HawkingHung, Patrick, MD;  [facility];  Service: Gastroenterology;  Laterality: N/A;  . HERNIA REPAIR  1960s  . WISDOM TOOTH EXTRACTION      Family History  Problem Relation Age of Onset  . CAD Father   . AAA (abdominal aortic aneurysm) Sister   . Thyroid disease Sister   . Allergies Other   . Clotting disorder Other   . Hypertension Other   . Heart disease Other   . Rheum arthritis Other   . Migraines Other     Social History   Socioeconomic History  . Marital status: Married    Spouse name: Not on file  . Number of children: Not on file  . Years of education: Not on file  . Highest education level: Not on file  Occupational History  . Not on file  Social Needs  . Financial resource strain: Not hard at all  .  Food insecurity:    Worry: Never true    Inability: Never true  . Transportation needs:    Medical: No    Non-medical: No  Tobacco Use  . Smoking status: Former Smoker    Packs/day: 0.25    Years: 10.00    Pack years: 2.50    Types: Cigarettes  . Smokeless tobacco: Never Used  . Tobacco comment: 11/09/2017 "quit smoking in the 1980s"  Substance and Sexual Activity  . Alcohol use: Never    Frequency: Never  . Drug use: Never  . Sexual activity: Not Currently  Lifestyle  . Physical activity:    Days per week: Not on file    Minutes per session: Not on file  . Stress:  Not on file  Relationships  . Social connections:    Talks on phone: Not on file    Gets together: Not on file    Attends religious service: Not on file    Active member of club or organization: Not on file    Attends meetings of clubs or organizations: Not on file    Relationship status: Not on file  . Intimate partner violence:    Fear of current or ex partner: Not on file    Emotionally abused: Not on file    Physically abused: Not on file    Forced sexual activity: Not on file  Other Topics Concern  . Not on file  Social History Narrative  . Not on file     Current Outpatient Medications:  .  carvedilol (COREG) 25 MG tablet, Take 1 tablet (25 mg total) by mouth 2 (two) times daily., Disp: 180 tablet, Rfl: 3 .  dilTIAZem HCl Coated Beads (DILTIAZEM CD PO), Take 240 mg by mouth daily. Take 1 tab daily every morning ., Disp: , Rfl:  .  furosemide (LASIX) 20 MG tablet, Take 1 tablet by mouth daily for the next 3 days then as needed for swelling, Disp: 20 tablet, Rfl: 2 .  Multiple Vitamin (MULTIVITAMIN) LIQD, Take 30 mLs by mouth daily., Disp: , Rfl:  .  omeprazole (PRILOSEC) 40 MG capsule, Take 1 capsule (40 mg total) by mouth daily., Disp: 30 capsule, Rfl: 3 .  PRESCRIPTION MEDICATION, Inhale into the lungs at bedtime. CPAP, Disp: , Rfl:  .  XARELTO 20 MG TABS tablet, TAKE 1 TABLET BY MOUTH EVERY DAY, Disp: 90 tablet, Rfl: 1  EXAM:  VITALS per patient if applicable: BP (!) 144/86   Pulse 74   Ht 5\' 5"  (1.651 m)   Wt 202 lb (91.6 kg)   BMI 33.61 kg/m   GENERAL: alert, oriented, appears well and in no acute distress  HEENT: atraumatic, conjunttiva clear, no obvious abnormalities on inspection of external nose and ears  NECK: normal movements of the head and neck  LUNGS: on inspection no signs of respiratory distress, breathing rate appears normal, no obvious gross SOB, gasping or wheezing  CV: no obvious cyanosis  MS: moves all visible extremities without noticeable  abnormality  PSYCH/NEURO: pleasant and cooperative, no obvious depression or anxiety, speech and thought processing grossly intact  ASSESSMENT AND PLAN:  Discussed the following assessment and plan:  Thyroid disease -Update thyroid function tests today.  -Symptoms likely related to medication given timing of onset.  -Hopefully she'll have improvement with d/c of diltiazem.        I discussed the assessment and treatment plan with the patient. The patient was provided an opportunity to ask questions and all were  answered. The patient agreed with the plan and demonstrated an understanding of the instructions.   The patient was advised to call back or seek an in-person evaluation if the symptoms worsen or if the condition fails to improve as anticipated.    Luetta Nutting, DO

## 2018-06-21 LAB — T3: T3, Total: 120 ng/dL (ref 76–181)

## 2018-06-21 LAB — TSH: TSH: 0.18 u[IU]/mL — ABNORMAL LOW (ref 0.35–4.50)

## 2018-06-21 LAB — T4, FREE: Free T4: 0.91 ng/dL (ref 0.60–1.60)

## 2018-06-26 DIAGNOSIS — G4733 Obstructive sleep apnea (adult) (pediatric): Secondary | ICD-10-CM | POA: Diagnosis not present

## 2018-06-30 ENCOUNTER — Other Ambulatory Visit: Payer: Self-pay | Admitting: Family Medicine

## 2018-06-30 ENCOUNTER — Telehealth: Payer: Self-pay

## 2018-06-30 DIAGNOSIS — E079 Disorder of thyroid, unspecified: Secondary | ICD-10-CM

## 2018-06-30 DIAGNOSIS — E0500 Thyrotoxicosis with diffuse goiter without thyrotoxic crisis or storm: Secondary | ICD-10-CM

## 2018-06-30 NOTE — Telephone Encounter (Signed)
Thanks

## 2018-06-30 NOTE — Telephone Encounter (Signed)
Copied from CRM [id number]. Topic: General - Other >> Jun 27, 2018 11:35 AM Jaquita Rector A wrote: Reason for CRM: Patient called to say that she would like for Dr Ashley Royalty to schedule her a visit with the Endocrinologist as discussed last visit Ph# [phone]

## 2018-06-30 NOTE — Telephone Encounter (Signed)
Orders entered

## 2018-07-04 NOTE — Progress Notes (Signed)
Virtual Visit via Video Note   This visit type was conducted due to national recommendations for restrictions regarding the COVID-19 Pandemic (e.g. social distancing) in an effort to limit this patient's exposure and mitigate transmission in our community.  Due to her co-morbid illnesses, this patient is at least at moderate risk for complications without adequate follow up.  This format is felt to be most appropriate for this patient at this time.  All issues noted in this document were discussed and addressed.  A limited physical exam was performed with this format.  Please refer to the patient's chart for her consent to telehealth for [facility].   Date:  07/05/2018   ID:  Mary ServeSandra J Poole, DOB 11/22/1950, MRN [id number]  Patient Location: Home Provider Location: Office  PCP:  Everrett CoombeMatthews, Cody, DO  Cardiologist:  No primary care provider on file.  Electrophysiologist:  Will Jorja LoaMartin Camnitz, MD   Evaluation Performed:  Follow-Up Visit  Chief Complaint:  Hair loss and a fib.   History of Present Illness:    Mary Poole is a 68 y.o. female with history significant for hypertension, OSA on CPAP, and atrial fibrillation/flutter status post ablation10/03/2018.  CHA2DS2Vasc of 3   On last visit if she were to have further a fib then flecainide would be choice. since then she called complaining of lightheadedness and fatigue and hair loss. Her dilt was decreased to 120 mg  She is on coreg 25 BID.  Last TSH sept was normal.    On last 06/19/18 visit  no chest pain or SOB.  No a fib that she is aware of.  But her hair is falling out a lot and she is now wearing a wig.  She has FH of hair loss but not to this severity.  She has some mild tremors in addition which reminds her of when she had acute Grave's disease.  She contacted her PCP and will see tomorrow for complete thyroid panel.  She is on her xarelto without problems.    With hair loss I stopped dilt to see how she did. Unfortunately  she had increased palpitations and actually went back up on dilt to 240 mg.  Her hair is still coming out in clumps.  She did have TSH evaluated and was 0.18 but normal free T4 at 0.91 and T3 of 120.  She is to see endocrinologist.  Remote hx of Grave's disease.  She is still wearing mask when she is out.  And practices social distancing.  No chest pain or SOB.  No bleeding with Xarelto.    The patient does not have symptoms concerning for COVID-19 infection (fever, chills, cough, or new shortness of breath).    Past Medical History:  Diagnosis Date  . Anxiety   . Atrial fibrillation (HCC)   . Complication of anesthesia    "woke up during colonoscopy 02/2017" (11/09/2017)  . GERD (gastroesophageal reflux disease)   . H/O atrial flutter   . History of blood transfusion 02/2017   LGIB (11/09/2017)  . Hypertension   . Migraine    "a few/year" (11/09/2017)  . OSA on CPAP    Past Surgical History:  Procedure Laterality Date  . ATRIAL FIBRILLATION ABLATION N/A 11/09/2017   Procedure: ATRIAL FIBRILLATION ABLATION;  Surgeon: Regan Lemmingamnitz, Will Martin, MD;  Location: [facility];  Service: Cardiovascular;  Laterality: N/A;  . COLONOSCOPY N/A 02/24/2017   Procedure: COLONOSCOPY;  Surgeon: Jeani HawkingHung, Patrick, MD;  [facility];  Service: Gastroenterology;  Laterality: N/A;  . ENTEROSCOPY N/A 02/24/2017   Procedure: ENTEROSCOPY;  Surgeon: Jeani Hawking, MD;  Location: [facility];  Service: Gastroenterology;  Laterality: N/A;  . HERNIA REPAIR  1960s  . WISDOM TOOTH EXTRACTION       Current Meds  Medication Sig  . carvedilol (COREG) 25 MG tablet Take 1 tablet (25 mg total) by mouth 2 (two) times daily.  . dilTIAZem HCl Coated Beads (DILTIAZEM CD PO) Take 240 mg by mouth daily. Take 1 tab daily every morning .  . furosemide (LASIX) 20 MG tablet Take 1 tablet by mouth daily for the next 3 days then as needed for swelling  . Multiple Vitamin (MULTIVITAMIN) LIQD Take 30 mLs by mouth daily.  Marland Kitchen  omeprazole (PRILOSEC) 40 MG capsule Take 1 capsule (40 mg total) by mouth daily.  Marland Kitchen PRESCRIPTION MEDICATION Inhale into the lungs at bedtime. CPAP  . XARELTO 20 MG TABS tablet TAKE 1 TABLET BY MOUTH EVERY DAY     Allergies:   Diphenhydramine hcl and Flecainide   Social History   Tobacco Use  . Smoking status: Former Smoker    Packs/day: 0.25    Years: 10.00    Pack years: 2.50    Types: Cigarettes  . Smokeless tobacco: Never Used  . Tobacco comment: 11/09/2017 "quit smoking in the 1980s"  Substance Use Topics  . Alcohol use: Never    Frequency: Never  . Drug use: Never     Family Hx: The patient's family history includes AAA (abdominal aortic aneurysm) in her sister; Allergies in an other family member; CAD in her father; Clotting disorder in an other family member; Heart disease in an other family member; Hypertension in an other family member; Migraines in an other family member; Rheum arthritis in an other family member; Thyroid disease in her sister.  ROS:   Please see the history of present illness.    General:no colds or fevers, no weight changes Skin:no rashes or ulcers HEENT:no blurred vision, no congestion CV:see HPI PUL:see HPI Neuro:no syncope, + lightheadedness + weakness in legs with meds and some lightheadedness but not in any pattern. Endo:no diabetes, + thyroid disease had hx of thyroid issues  All other systems reviewed and are negative.   Prior CV studies:   The following studies were reviewed today:  Echo 09/18/17 Study Conclusions  - Left ventricle: The cavity size was normal. Wall thickness was increased in a pattern of mild LVH. Systolic function was normal. The estimated ejection fraction was in the range of 55% to 60%. Wall motion was normal; there were no regional wall motion abnormalities. Doppler parameters are consistent with restrictive physiology, indicative of decreased left ventricular diastolic compliance and/or increased  left atrial pressure. - Mitral valve: Calcified annulus. There was moderate regurgitation. - Left atrium: The atrium was severely dilated. - Atrial septum: The septum bowed from left to right, consistent with increased left atrial pressure. - Pulmonary arteries: Systolic pressure was moderately increased. PA peak pressure: 52 mm Hg (S).  Impressions:  - Normal LV function; mild LVH; restrictive filling; moderate MR; severe LAE; mild TR with moderate pulmonary hypertension.   Labs/Other Tests and Data Reviewed:    EKG:  An ECG dated 03/10/18 was personally reviewed today and demonstrated:  SR and no acute changes  Recent Labs: 10/15/2017: Magnesium 1.9 10/30/2017: ALT 23 11/01/2017: BUN 17; Creatinine, Ser 1.13; Hemoglobin 12.7; Platelets 268; Potassium 3.9; Sodium 140 06/20/2018: TSH 0.18   Recent Lipid Panel No results found for: CHOL,  TRIG, HDL, CHOLHDL, LDLCALC, LDLDIRECT  Wt Readings from Last 3 Encounters:  07/05/18 202 lb (91.6 kg)  06/20/18 202 lb (91.6 kg)  06/19/18 202 lb (91.6 kg)     Objective:    Vital Signs:  Ht  (1.651 m)   Wt 202 lb (91.6 kg)   BMI 33.61 kg/m    VITAL SIGNS:  reviewed unable to do BP General:  Female in NAD Neuro: A&O X 3 follows commands Lungs can speak in complete sentences without SOB Psych:  Pleasant affect . ASSESSMENT & PLAN:    1. Increased palpitations with decreased dose of dilt and then we stopped for hair loss,  Now back on dilt 240 mg.  Also with leg weakness.  With multiple issues and when I discussed flecainide she felt she had side effects with this.  I will refer back to Dr. [name] for management.    2. Hair loss - may be due to dilt or BB and her TSH is elevated but normal free T4 and T3, to see endo with history of Graves disease.   3. Weakness episodically she attributes to dilt.  4.  PAFlutter and fib.  Ablation 11/2017.  On xarelto without bleeding. Will defer to Dr. [name].   COVID-19 Education:  The signs and symptoms of COVID-19 were discussed with the patient and how to seek care for testing (follow up with PCP or arrange E-visit).  The importance of social distancing was discussed today.  Time:   Today, I have spent 5 minutes with the patient with telehealth technology discussing the above problems.     Medication Adjustments/Labs and Tests Ordered: Current medicines are reviewed at length with the patient today.  Concerns regarding medicines are outlined above.   Tests Ordered: No orders of the defined types were placed in this encounter.   Medication Changes: No orders of the defined types were placed in this encounter.   Disposition:  Follow up in 2 week(s)  Signed, Nada Boozer, NP  07/05/2018 5:49 PM    [facility]

## 2018-07-05 ENCOUNTER — Encounter: Payer: Self-pay | Admitting: Cardiology

## 2018-07-05 ENCOUNTER — Telehealth (INDEPENDENT_AMBULATORY_CARE_PROVIDER_SITE_OTHER): Payer: Medicare Other | Admitting: Cardiology

## 2018-07-05 ENCOUNTER — Telehealth: Payer: Self-pay | Admitting: *Deleted

## 2018-07-05 ENCOUNTER — Other Ambulatory Visit: Payer: Self-pay

## 2018-07-05 VITALS — Ht 65.0 in | Wt 202.0 lb

## 2018-07-05 DIAGNOSIS — R002 Palpitations: Secondary | ICD-10-CM | POA: Diagnosis not present

## 2018-07-05 DIAGNOSIS — Z7901 Long term (current) use of anticoagulants: Secondary | ICD-10-CM

## 2018-07-05 DIAGNOSIS — R531 Weakness: Secondary | ICD-10-CM

## 2018-07-05 DIAGNOSIS — I480 Paroxysmal atrial fibrillation: Secondary | ICD-10-CM

## 2018-07-05 DIAGNOSIS — T50905A Adverse effect of unspecified drugs, medicaments and biological substances, initial encounter: Secondary | ICD-10-CM

## 2018-07-05 DIAGNOSIS — L658 Other specified nonscarring hair loss: Secondary | ICD-10-CM

## 2018-07-05 NOTE — Telephone Encounter (Signed)
Virtual Visit Pre-Appointment Phone Call  "(Name), I am calling you today to discuss your upcoming appointment. We are currently trying to limit exposure to the virus that causes COVID-19 by seeing patients at home rather than in the office."  1. "What is the BEST phone number to call the day of the visit?" - include this in appointment notes  2. "Do you have or have access to (through a family member/friend) a smartphone with video capability that we can use for your visit?" a. If yes - list this number in appt notes as "cell" (if different from BEST phone #) and list the appointment type as a VIDEO visit in appointment notes b. If no - list the appointment type as a PHONE visit in appointment notes  3. Confirm consent - "In the setting of the current Covid19 crisis, you are scheduled for a (phone or video) visit with your provider on (date) at (time).  Just as we do with many in-office visits, in order for you to participate in this visit, we must obtain consent.  If you'd like, I can send this to your mychart (if signed up) or email for you to review.  Otherwise, I can obtain your verbal consent now.  All virtual visits are billed to your insurance company just like a normal visit would be.  By agreeing to a virtual visit, we'd like you to understand that the technology does not allow for your provider to perform an examination, and thus may limit your provider's ability to fully assess your condition. If your provider identifies any concerns that need to be evaluated in person, we will make arrangements to do so.  Finally, though the technology is pretty good, we cannot assure that it will always work on either your or our end, and in the setting of a video visit, we may have to convert it to a phone-only visit.  In either situation, we cannot ensure that we have a secure connection.  Are you willing to proceed?" STAFF: Did the patient verbally acknowledge consent to telehealth visit? Document  YES/NO here: yes  4. Advise patient to be prepared - "Two hours prior to your appointment, go ahead and check your blood pressure, pulse, oxygen saturation, and your weight (if you have the equipment to check those) and write them all down. When your visit starts, your provider will ask you for this information. If you have an Apple Watch or Kardia device, please plan to have heart rate information ready on the day of your appointment. Please have a pen and paper handy nearby the day of the visit as well."  5. Give patient instructions for MyChart download to smartphone OR [email or web address] as below if video visit (depending on what platform provider is using)  6. Inform patient they will receive a phone call 15 minutes prior to their appointment time (may be from unknown caller ID) so they should be prepared to answer    TELEPHONE CALL NOTE  Mary Poole has been deemed a candidate for a follow-up tele-health visit to limit community exposure during the Covid-19 pandemic. I spoke with the patient via phone to ensure availability of phone/video source, confirm preferred email & phone number, and discuss instructions and expectations.  I reminded Mary Poole to be prepared with any vital sign and/or heart rhythm information that could potentially be obtained via home monitoring, at the time of her visit. I reminded Mary Poole to expect a phone call prior to  her visit.  Shadiamond Koska 07/05/2018 4:53 PM   INSTRUCTIONS FOR DOWNLOADING THE MYCHART APP TO SMARTPHONE  - The patient must first make sure to have activated MyChart and know their login information - If Apple, go to [employer] and type in MyChart in the search bar and download the app. If Android, ask patient to go to [employer] and type in [place] in the search bar and download the app. The app is free but as with any other app downloads, their phone may require them to verify saved payment information or Apple/Android  password.  - The patient will need to then log into the app with their MyChart username and password, and select [facility] as their healthcare provider to link the account. When it is time for your visit, go to the MyChart app, find appointments, and click Begin Video Visit. Be sure to Select Allow for your device to access the Microphone and Camera for your visit. You will then be connected, and your provider will be with you shortly.  **If they have any issues connecting, or need assistance please contact MyChart service desk [phone])**  **If using a computer, in order to ensure the best quality for their visit they will need to use either of the following Internet Browsers: [employer] or Google Chrome**  IF USING DOXIMITY or DOXY.ME - The patient will receive a link just prior to their visit by text.     FULL LENGTH CONSENT FOR TELE-HEALTH VISIT   I hereby voluntarily request, consent and authorize [facility] and its employed or contracted physicians, physician assistants, nurse practitioners or other licensed health care professionals (the Practitioner), to provide me with telemedicine health care services (the "Services") as deemed necessary by the treating Practitioner. I acknowledge and consent to receive the Services by the Practitioner via telemedicine. I understand that the telemedicine visit will involve communicating with the Practitioner through live audiovisual communication technology and the disclosure of certain medical information by electronic transmission. I acknowledge that I have been given the opportunity to request an in-person assessment or other available alternative prior to the telemedicine visit and am voluntarily participating in the telemedicine visit.  I understand that I have the right to withhold or withdraw my consent to the use of telemedicine in the course of my care at any time, without affecting my right to future care or treatment,  and that the Practitioner or I may terminate the telemedicine visit at any time. I understand that I have the right to inspect all information obtained and/or recorded in the course of the telemedicine visit and may receive copies of available information for a reasonable fee.  I understand that some of the potential risks of receiving the Services via telemedicine include:  Marland Kitchen Delay or interruption in medical evaluation due to technological equipment failure or disruption; . Information transmitted may not be sufficient (e.g. poor resolution of images) to allow for appropriate medical decision making by the Practitioner; and/or  . In rare instances, security protocols could fail, causing a breach of personal health information.  Furthermore, I acknowledge that it is my responsibility to provide information about my medical history, conditions and care that is complete and accurate to the best of my ability. I acknowledge that Practitioner's advice, recommendations, and/or decision may be based on factors not within their control, such as incomplete or inaccurate data provided by me or distortions of diagnostic images or specimens that may result from electronic transmissions. I understand  that the practice of medicine is not an exact science and that Practitioner makes no warranties or guarantees regarding treatment outcomes. I acknowledge that I will receive a copy of this consent concurrently upon execution via email to the email address I last provided but may also request a printed copy by calling the office of [facility].    I understand that my insurance will be billed for this visit.   I have read or had this consent read to me. . I understand the contents of this consent, which adequately explains the benefits and risks of the Services being provided via telemedicine.  . I have been provided ample opportunity to ask questions regarding this consent and the Services and have had my questions  answered to my satisfaction. . I give my informed consent for the services to be provided through the use of telemedicine in my medical care  By participating in this telemedicine visit I agree to the above.

## 2018-07-05 NOTE — Patient Instructions (Signed)
Medication Instructions:  Your physician recommends that you continue on your current medications as directed. Please refer to the Current Medication list given to you today. 2 If you need a refill on your cardiac medications before your next appointment, please call your pharmacy.   Lab work: None ordered  If you have labs (blood work) drawn today and your tests are completely normal, you will receive your results only by: Marland Kitchen MyChart Message (if you have MyChart) OR . A paper copy in the mail If you have any lab test that is abnormal or we need to change your treatment, we will call you to review the results.  Testing/Procedures: None ordered   Follow-Up: WE WILL HAVE SOMEONE CALL YOU TO MAKE AN APPOINTMENT WITH DR. [name]

## 2018-07-07 ENCOUNTER — Telehealth (INDEPENDENT_AMBULATORY_CARE_PROVIDER_SITE_OTHER): Payer: Medicare Other | Admitting: Cardiology

## 2018-07-07 ENCOUNTER — Other Ambulatory Visit: Payer: Self-pay

## 2018-07-07 DIAGNOSIS — I4891 Unspecified atrial fibrillation: Secondary | ICD-10-CM

## 2018-07-07 MED ORDER — CARVEDILOL 25 MG PO TABS: 12.5000 mg | ORAL_TABLET | Freq: Two times a day (BID) | ORAL | 1 refills | Status: DC

## 2018-07-07 NOTE — Progress Notes (Signed)
Electrophysiology TeleHealth Note   Due to national recommendations of social distancing due to COVID 19, an audio/video telehealth visit is felt to be most appropriate for this patient at this time.  See Epic message for the patient's consent to telehealth for [facility].   Date:  07/07/2018   ID:  Mary Poole, DOB 1950/02/20, MRN [id number]  Location: patient's home  Provider location: [address] [place]  Evaluation Performed: Follow-up visit  PCP:  Everrett Coombe, DO  Cardiologist:  No primary care provider on file.  Electrophysiologist:  Dr [name]  Chief Complaint:  AF  History of Present Illness:    Mary Poole is a 68 y.o. female who presents via audio/video conferencing for a telehealth visit today.  Since last being seen in our clinic, the patient reports doing very well.  Today, she denies symptoms of palpitations, chest pain, shortness of breath,  lower extremity edema, dizziness, presyncope, or syncope.  The patient is otherwise without complaint today.  The patient denies symptoms of fevers, chills, cough, or new SOB worrisome for COVID 19.  She has a history significant for hypertension, OSA on CPAP, and atrial fibrillation/flutter status post ablation 11/09/2017.  Unfortunately she has continued to have episodes of atrial fibrillation.She is having some issues with hair loss.  Today, denies symptoms of palpitations, chest pain, shortness of breath, orthopnea, PND, lower extremity edema, claudication, dizziness, presyncope, syncope, bleeding, or neurologic sequela. The patient is tolerating medications without difficulties.  She is overall doing well.  She has noted some hair loss, but she is being worked up by endocrinology for that.  Her thyroid function studies were abnormal.  She is also having some weakness in her legs.  She says that she can go out and walk and after that, she does get leg weakness.  After walking and eating healthy over the  next few days, though, she feels better.  She tried to decrease diltiazem to see if this would help with her leg weakness but it made no difference.  Past Medical History:  Diagnosis Date   Anxiety    Atrial fibrillation (HCC)    Complication of anesthesia    "woke up during colonoscopy 02/2017" (11/09/2017)   GERD (gastroesophageal reflux disease)    H/O atrial flutter    History of blood transfusion 02/2017   LGIB (11/09/2017)   Hypertension    Migraine    "a few/year" (11/09/2017)   OSA on CPAP     Past Surgical History:  Procedure Laterality Date   ATRIAL FIBRILLATION ABLATION N/A 11/09/2017   Procedure: ATRIAL FIBRILLATION ABLATION;  Surgeon: Regan Lemming, MD;  [facility];  Service: Cardiovascular;  Laterality: N/A;   COLONOSCOPY N/A 02/24/2017   Procedure: COLONOSCOPY;  Surgeon: Jeani Hawking, MD;  Location: [facility] ENDOSCOPY;  Service: Gastroenterology;  Laterality: N/A;   ENTEROSCOPY N/A 02/24/2017   Procedure: ENTEROSCOPY;  Surgeon: Jeani Hawking, MD;  Location: [facility] ENDOSCOPY;  Service: Gastroenterology;  Laterality: N/A;   HERNIA REPAIR  1960s   WISDOM TOOTH EXTRACTION      Current Outpatient Medications  Medication Sig Dispense Refill   carvedilol (COREG) 25 MG tablet Take 1 tablet (25 mg total) by mouth 2 (two) times daily. 180 tablet 3   dilTIAZem HCl Coated Beads (DILTIAZEM CD PO) Take 240 mg by mouth daily. Take 1 tab daily every morning .     furosemide (LASIX) 20 MG tablet Take 1 tablet by mouth daily for  the next 3 days then as needed for swelling 20 tablet 2   Multiple Vitamin (MULTIVITAMIN) LIQD Take 30 mLs by mouth daily.     omeprazole (PRILOSEC) 40 MG capsule Take 1 capsule (40 mg total) by mouth daily. 30 capsule 3   PRESCRIPTION MEDICATION Inhale into the lungs at bedtime. CPAP     XARELTO 20 MG TABS tablet TAKE 1 TABLET BY MOUTH EVERY DAY 90 tablet 1   No current facility-administered medications for this visit.      Allergies:   Diphenhydramine hcl and Flecainide   Social History:  The patient  reports that she has quit smoking. Her smoking use included cigarettes. She has a 2.50 pack-year smoking history. She has never used smokeless tobacco. She reports that she does not drink alcohol or use drugs.   Family History:  The patient's  family history includes AAA (abdominal aortic aneurysm) in her sister; Allergies in an other family member; CAD in her father; Clotting disorder in an other family member; Heart disease in an other family member; Hypertension in an other family member; Migraines in an other family member; Rheum arthritis in an other family member; Thyroid disease in her sister.   ROS:  Please see the history of present illness.   All other systems are personally reviewed and negative.    Exam:    Vital Signs:  BP (!) 161/73    Pulse (!) 59   Well appearing, alert and conversant, regular work of breathing,  good skin color Eyes- anicteric, neuro- grossly intact, skin- no apparent rash or lesions or cyanosis, mouth- oral mucosa is pink   Labs/Other Tests and Data Reviewed:    Recent Labs: 10/15/2017: Magnesium 1.9 10/30/2017: ALT 23 11/01/2017: BUN 17; Creatinine, Ser 1.13; Hemoglobin 12.7; Platelets 268; Potassium 3.9; Sodium 140 06/20/2018: TSH 0.18   Wt Readings from Last 3 Encounters:  07/05/18 202 lb (91.6 kg)  06/20/18 202 lb (91.6 kg)  06/19/18 202 lb (91.6 kg)     Other studies personally reviewed: Additional studies/ records that were reviewed today include: ECG 03/10/2018 personally reviewed Review of the above records today demonstrates: Sinus rhythm    ASSESSMENT & PLAN:    1.  Paroxysmal atrial fibrillation/flutter: Status post ablation October 2019.  Currently on Xarelto, diltiazem, carvedilol.  She is doing well on her current medication.  She has had minimal episodes of atrial fibrillation.  She did have some leg weakness and try to decrease her diltiazem.  This  did not make a difference.  I have asked her to decrease her carvedilol but by cutting it in half to see if this Teal Bontrager make a difference.  This patients CHA2DS2-VASc Score and unadjusted Ischemic Stroke Rate (% per year) is equal to 3.2 % stroke rate/year from a score of 3  Above score calculated as 1 point each if present [CHF, HTN, DM, Vascular=MI/PAD/Aortic Plaque, Age if 65-74, or Female] Above score calculated as 2 points each if present [Age > 75, or Stroke/TIA/TE]    2.  Hypertension: Blood pressure is elevated today, but has been normal in the past.  I have asked her to continue to check her blood pressures and if it starts to rise above 130 consistently to call us back.  3.  OSA: CPAP compliance encouraged   COVID 19 screen The patient denies symptoms of COVID 19 at this time.  The importance of social distancing was discussed today.  Follow-up: 6 months  Current medicines are reviewed at length with  the patient today.   The patient does not have concerns regarding her medicines.  The following changes were made today: Decrease carvedilol  Labs/ tests ordered today include:  No orders of the defined types were placed in this encounter.    Patient Risk:  after full review of this patients clinical status, I feel that they are at moderate risk at this time.  Today, I have spent 9 minutes with the patient with telehealth technology discussing atrial fibrillation.    Signed, Cedric Mcclaine Jorja LoaMartin Therma Lasure, MD  07/07/2018 12:01 PM     [facility] [address] [place] [address] [phone] (office) [phone] (fax)

## 2018-07-07 NOTE — Addendum Note (Signed)
Addended by: Baird Lyons on: 07/07/2018 12:15 PM   Modules accepted: Orders

## 2018-07-27 DIAGNOSIS — G4733 Obstructive sleep apnea (adult) (pediatric): Secondary | ICD-10-CM | POA: Diagnosis not present

## 2018-08-01 ENCOUNTER — Encounter: Payer: Self-pay | Admitting: Internal Medicine

## 2018-08-01 ENCOUNTER — Other Ambulatory Visit: Payer: Self-pay

## 2018-08-01 ENCOUNTER — Ambulatory Visit (INDEPENDENT_AMBULATORY_CARE_PROVIDER_SITE_OTHER): Payer: Medicare Other | Admitting: Internal Medicine

## 2018-08-01 VITALS — BP 138/82 | HR 81 | Ht 65.0 in | Wt 204.8 lb

## 2018-08-01 DIAGNOSIS — E0590 Thyrotoxicosis, unspecified without thyrotoxic crisis or storm: Secondary | ICD-10-CM | POA: Diagnosis not present

## 2018-08-01 NOTE — Progress Notes (Signed)
Name: Mary Poole  MRN/ DOB: [id number], 01/19/1951    Age/ Sex: 68 y.o., female    PCP: Everrett CoombeMatthews, Cody, DO   Reason for Endocrinology Evaluation: Graves' Disease     Date of Initial Endocrinology Evaluation: 08/02/2018     HPI: Mary Poole is a 68 y.o. female with a past medical history of Graves' Disease, A.Fib, OSA on CPAP,  Migraine and HTN. The patient presented for initial endocrinology clinic visit on 08/02/2018 for consultative assistance with her Graves' Disease.   Pt was diagnosed with A.Fib in 11/2017. She is S/P cardiac ablation . She was on Amiodarone for just a few weeks in 10/2017 but couldn't;t tolerate it.   Pt was diagnosed with Graves' Disease in her 6030's. She was on Atenolol and Methimazole for ~ 6 weeks and no issues since then.  She was evaluated by PCP in 06/2018 due to c/o hair loss , weakness,and tremors. Repeat TSH was low at 0.18 uIU/mL with normal FT4 and T3.    She is not on Biotin   She denies any local neck symptoms.   No prior exposure to radiation  Denies recent viral infections.     Sister with hypothyroidism   HISTORY:  Past Medical History:  Past Medical History:  Diagnosis Date  . Anxiety   . Atrial fibrillation (HCC)   . Complication of anesthesia    "woke up during colonoscopy 02/2017" (11/09/2017)  . GERD (gastroesophageal reflux disease)   . H/O atrial flutter   . History of blood transfusion 02/2017   LGIB (11/09/2017)  . Hypertension   . Migraine    "a few/year" (11/09/2017)  . OSA on CPAP    Past Surgical History:  Past Surgical History:  Procedure Laterality Date  . ATRIAL FIBRILLATION ABLATION N/A 11/09/2017   Procedure: ATRIAL FIBRILLATION ABLATION;  Surgeon: Regan Lemmingamnitz, Will Martin, MD;  [facility] LAB;  Service: Cardiovascular;  Laterality: N/A;  . COLONOSCOPY N/A 02/24/2017   Procedure: COLONOSCOPY;  Surgeon: Jeani HawkingHung, Patrick, MD;  [facility];  Service: Gastroenterology;  Laterality: N/A;  .  ENTEROSCOPY N/A 02/24/2017   Procedure: ENTEROSCOPY;  Surgeon: Jeani HawkingHung, Patrick, MD;  [facility];  Service: Gastroenterology;  Laterality: N/A;  . HERNIA REPAIR  1960s  . WISDOM TOOTH EXTRACTION        Social History:  reports that she has quit smoking. Her smoking use included cigarettes. She has a 2.50 pack-year smoking history. She has never used smokeless tobacco. She reports that she does not drink alcohol or use drugs.  Family History: family history includes AAA (abdominal aortic aneurysm) in her sister; Allergies in an other family member; CAD in her father; Clotting disorder in an other family member; Heart disease in an other family member; Hypertension in an other family member; Migraines in an other family member; Rheum arthritis in an other family member; Thyroid disease in her sister.   HOME MEDICATIONS: Allergies as of 08/01/2018      Reactions   Diphenhydramine Hcl Hives, Other (See Comments)   Erratic heartbeat   Flecainide Rash      Medication List       Accurate as of August 01, 2018 11:59 PM. If you have any questions, ask your nurse or doctor.        carvedilol 25 MG tablet Commonly known as: COREG Take 0.5 tablets (12.5 mg total) by mouth 2 (two) times daily.   DILTIAZEM CD PO Take 240 mg by mouth daily.  Take 1 tab daily every morning .   furosemide 20 MG tablet Commonly known as: LASIX Take 1 tablet by mouth daily for the next 3 days then as needed for swelling   multivitamin Liqd Take 30 mLs by mouth daily.   omeprazole 40 MG capsule Commonly known as: PRILOSEC Take 1 capsule (40 mg total) by mouth daily.   PRESCRIPTION MEDICATION Inhale into the lungs at bedtime. CPAP   Xarelto 20 MG Tabs tablet Generic drug: rivaroxaban TAKE 1 TABLET BY MOUTH EVERY DAY         REVIEW OF SYSTEMS: A comprehensive ROS was conducted with the patient and is negative except as per HPI and below:  Review of Systems  Constitutional: Positive for  malaise/fatigue. Negative for weight loss.  HENT: Negative for congestion and sore throat.   Eyes: Positive for pain. Negative for discharge.       Dry eyes  Respiratory: Negative for cough and shortness of breath.   Cardiovascular: Positive for palpitations. Negative for chest pain.  Gastrointestinal: Negative for diarrhea and nausea.  Genitourinary: Positive for frequency.  Skin: Negative.   Neurological: Positive for tremors. Negative for tingling.  Endo/Heme/Allergies: Negative for polydipsia.  Psychiatric/Behavioral: Negative for depression. The patient is nervous/anxious.        OBJECTIVE:  VS: BP 138/82 (BP Location: Left Arm, Patient Position: Sitting, Cuff Size: Large)   Pulse 81   Ht 5\' 5"  (1.651 m)   Wt 204 lb 12.8 oz (92.9 kg)   SpO2 97%   BMI 34.08 kg/m    Wt Readings from Last 3 Encounters:  08/01/18 204 lb 12.8 oz (92.9 kg)  07/05/18 202 lb (91.6 kg)  06/20/18 202 lb (91.6 kg)     EXAM: General: Pt appears well and is in NAD  Hydration: Well-hydrated with moist mucous membranes and good skin turgor  Eyes: External eye exam normal without stare, lid lag or exophthalmos.  EOM intact.   Ears, Nose, Throat: Hearing: Grossly intact bilaterally Dental: Good dentition  Throat: Clear without mass, erythema or exudate  Neck: General: Supple without adenopathy. Thyroid: Thyroid size normal.  No goiter or nodules appreciated. No thyroid bruit.  Lungs: Clear with good BS bilat with no rales, rhonchi, or wheezes  Heart: Auscultation: RRR.  Abdomen: Normoactive bowel sounds, soft, nontender, without masses or organomegaly palpable  Extremities: BL LE: No pretibial edema normal ROM and strength.  Skin: Hair: Texture and amount normal with gender appropriate distribution Skin Inspection: No rashes. Skin Palpation: Skin temperature, texture, and thickness normal to palpation  Neuro: Cranial nerves: II - XII grossly intact  Motor: Normal strength throughout DTRs: 2+ and  symmetric in UE without delay in relaxation phase  Mental Status: Judgment, insight: Intact Orientation: Oriented to time, place, and person Mood and affect: No depression, anxiety, or agitation     DATA REVIEWED:   Results for Mary Poole, Mary Poole (MRN [id number]) as of 08/01/2018 12:18  Ref. Range 06/20/2018 13:48  TSH Latest Ref Range: 0.35 - 4.50 uIU/mL 0.18 (L)  Triiodothyronine (T3) Latest Ref Range: 76 - 181 ng/dL 782120  N5,AOZH(YQMVHQT4,Free(Direct) Latest Ref Range: 0.60 - 1.60 ng/dL 4.690.91    ASSESSMENT/PLAN/RECOMMENDATIONS:   1. Subclinical Hyperthyroidism:  - Pt with prior diagnosis of Graves' in her 6030's - Today she has some non-specific symptoms that could be attributed to her thyroid.  - We discussed D/D of graves' disease vs autonomous nodule (s) - We discussed that most pt with subclinical hyperthyroidism have no clinical manifestations of  hyperthyroidism, and those symptoms that are present (eg, tachycardia, tremor, dyspnea on exertion, weight loss) are mild and nonspecific." However, subclinical hyperthyroidism is associated with an increased risk of atrial fibrillation and, primarily in postmenopausal women, a decrease in bone mineral density. - Pt is at high risk for cardiac arrhythmia    Addendum: Pt was supposed to have repeat labs today but left without labs, she will be contacted .   Signed electronically by: Mack Guise, MD  [facility] Endocrinology  [facility] [address] [place], [place] [address] Phone: [phone] FAX: [phone]   CC: [address], [place] [address] Phone: [phone] Fax: [phone]   Return to Endocrinology clinic as below: Future Appointments  Date Time Provider [place]  08/28/2018  2:45 PM [facility] None  11/01/2018  1:00 PM Devine Klingel, Melanie Crazier, MD [facility] None

## 2018-08-01 NOTE — Patient Instructions (Addendum)
-   Stop by the lab today, we will contact you with the results

## 2018-08-02 ENCOUNTER — Other Ambulatory Visit: Payer: Self-pay | Admitting: Cardiology

## 2018-08-02 MED ORDER — CARVEDILOL 25 MG PO TABS: 12.5000 mg | ORAL_TABLET | Freq: Two times a day (BID) | ORAL | 3 refills | Status: DC

## 2018-08-18 ENCOUNTER — Other Ambulatory Visit: Payer: Self-pay | Admitting: Family Medicine

## 2018-08-28 ENCOUNTER — Other Ambulatory Visit: Payer: Medicare Other

## 2018-08-28 DIAGNOSIS — G4733 Obstructive sleep apnea (adult) (pediatric): Secondary | ICD-10-CM | POA: Diagnosis not present

## 2018-08-28 DIAGNOSIS — I10 Essential (primary) hypertension: Secondary | ICD-10-CM | POA: Diagnosis not present

## 2018-09-12 ENCOUNTER — Other Ambulatory Visit: Payer: Self-pay

## 2018-09-12 ENCOUNTER — Other Ambulatory Visit (INDEPENDENT_AMBULATORY_CARE_PROVIDER_SITE_OTHER): Payer: Medicare Other

## 2018-09-12 DIAGNOSIS — E0590 Thyrotoxicosis, unspecified without thyrotoxic crisis or storm: Secondary | ICD-10-CM | POA: Diagnosis not present

## 2018-09-12 LAB — T4, FREE: Free T4: 0.94 ng/dL (ref 0.60–1.60)

## 2018-09-12 LAB — TSH: TSH: 0.37 u[IU]/mL (ref 0.35–4.50)

## 2018-09-15 LAB — TRAB (TSH RECEPTOR BINDING ANTIBODY): TRAB: 1.01 IU/L (ref <=2.00)

## 2018-09-15 LAB — T3: T3, Total: 143 ng/dL (ref 76–181)

## 2018-09-15 LAB — THYROID STIMULATING IMMUNOGLOBULIN: TSI: 129 % baseline (ref <140)

## 2018-09-23 ENCOUNTER — Other Ambulatory Visit: Payer: Self-pay | Admitting: Cardiology

## 2018-09-25 NOTE — Telephone Encounter (Signed)
42 female 92.9kg Scr 0.8 08/07/18  lovw/camnitz 07/07/18

## 2018-09-25 NOTE — Telephone Encounter (Signed)
35 female 92.9kg Scr 1.13 11/01/17  lovw/camnitz 07/07/18

## 2018-09-29 ENCOUNTER — Telehealth: Payer: Self-pay | Admitting: Family Medicine

## 2018-09-29 NOTE — Telephone Encounter (Signed)
I called and spoke to patient about scheduling a follow up appointment with Dr. Zigmund Daniel.  Patient stated that she will call back at a later date to schedule when she is ready.

## 2018-10-09 ENCOUNTER — Telehealth: Payer: Self-pay | Admitting: Cardiology

## 2018-10-09 DIAGNOSIS — Z79899 Other long term (current) drug therapy: Secondary | ICD-10-CM

## 2018-10-09 DIAGNOSIS — I483 Typical atrial flutter: Secondary | ICD-10-CM

## 2018-10-09 NOTE — Telephone Encounter (Signed)
New Message:    Pt says she needs Dr Carlis Stable to give her something naturall for anxiety. She is about to jump out of her skin.

## 2018-10-09 NOTE — Telephone Encounter (Signed)
Pt c/o medication issue:  1. Name of Medication:  Xarelto  2. How are you currently taking this medication (dosage and times per day)?  1 time a day  3. Are you having a reaction (difficulty breathing--STAT)? no  4. What is your medication issue? Having extreme back ache, muscles twitching in her body

## 2018-10-10 NOTE — Telephone Encounter (Signed)
° ° °  Please return call to patient °

## 2018-10-10 NOTE — Addendum Note (Signed)
Addended by: Stanton Kidney on: 10/10/2018 05:51 PM   Modules accepted: Orders

## 2018-10-10 NOTE — Telephone Encounter (Signed)
See other telephone from 8/31 for documentation.

## 2018-10-10 NOTE — Telephone Encounter (Signed)
Pt reporting a lot of personal stressors recently (friend passed away, brother-in-law left sister w/o notice, etc).  She has also noticed increased palpitations/flutters when stressed.  Discussed this correlation w/ pt.  Pt would like to try a different blood thinner to see if reported issues (back ache/skin crawling, patient informed we do not feel this is r/t Xarelto)  subsides/stops. Discussed w/ Dr. Curt Bears who is agreeable to switch.  Pt will stop by office tomorrow for blood work to determine proper dosing.  Aware it will be next week before I call with advisement.  Pt also reports anxiety.  She would like to know something natural she might be able to take.  Pt advised to discuss further with PCP.  Pt agreeable.

## 2018-10-11 ENCOUNTER — Other Ambulatory Visit: Payer: Self-pay

## 2018-10-11 ENCOUNTER — Telehealth: Payer: Self-pay | Admitting: Cardiology

## 2018-10-11 ENCOUNTER — Other Ambulatory Visit: Payer: Medicare Other | Admitting: *Deleted

## 2018-10-11 DIAGNOSIS — Z79899 Other long term (current) drug therapy: Secondary | ICD-10-CM | POA: Diagnosis not present

## 2018-10-11 DIAGNOSIS — I483 Typical atrial flutter: Secondary | ICD-10-CM

## 2018-10-11 MED ORDER — DILTIAZEM HCL ER COATED BEADS 240 MG PO CP24: 240.0000 mg | ORAL_CAPSULE | Freq: Every day | ORAL | 1 refills | Status: DC

## 2018-10-11 NOTE — Telephone Encounter (Signed)
Refill sent.

## 2018-10-11 NOTE — Telephone Encounter (Signed)
Pt calling requesting a refill on diltiazem 240 mg tablet. This medication is listed as coated beads and it will not let me reorder this type of medication. Pt would like for the medication to be sent to her pharmacy [facility] on [address]. Please address

## 2018-10-12 LAB — CBC
Hematocrit: 37.9 % (ref 34.0–46.6)
Hemoglobin: 12.2 g/dL (ref 11.1–15.9)
MCH: 27.1 pg (ref 26.6–33.0)
MCHC: 32.2 g/dL (ref 31.5–35.7)
MCV: 84 fL (ref 79–97)
Platelets: 263 10*3/uL (ref 150–450)
RBC: 4.51 x10E6/uL (ref 3.77–5.28)
RDW: 15.6 % — ABNORMAL HIGH (ref 11.7–15.4)
WBC: 10 10*3/uL (ref 3.4–10.8)

## 2018-10-12 LAB — BASIC METABOLIC PANEL
BUN/Creatinine Ratio: 16 (ref 12–28)
BUN: 16 mg/dL (ref 8–27)
CO2: 27 mmol/L (ref 20–29)
Calcium: 9.4 mg/dL (ref 8.7–10.3)
Chloride: 102 mmol/L (ref 96–106)
Creatinine, Ser: 1 mg/dL (ref 0.57–1.00)
GFR calc Af Amer: 67 mL/min/{1.73_m2} (ref >59)
GFR calc non Af Amer: 58 mL/min/{1.73_m2} — ABNORMAL LOW (ref >59)
Glucose: 98 mg/dL (ref 65–99)
Potassium: 4.4 mmol/L (ref 3.5–5.2)
Sodium: 142 mmol/L (ref 134–144)

## 2018-10-18 ENCOUNTER — Telehealth: Payer: Self-pay | Admitting: *Deleted

## 2018-10-18 NOTE — Telephone Encounter (Signed)
Pt has been notified of lab results by phone with verbal understanding. Pt states she was having lab work done to see if she could switch from Xarelto to possibly Eliquis and that she has been discussing this with Venida Jarvis, RN for Dr. Curt Bears. I assured the pt that I will send Sherri my notes from today and she will call back with recommendations from Dr. Curt Bears. Pt thanked me for the call and my help. The patient has been notified of the result and verbalized understanding.  All questions (if any) were answered. Julaine Hua, [employer] 10/18/2018 12:05 PM

## 2018-10-18 NOTE — Telephone Encounter (Signed)
lmtcb  (pt can switch to Eliquis 5 mg BID)

## 2018-10-18 NOTE — Telephone Encounter (Signed)
-----   Message from Will Meredith Leeds, MD sent at 10/12/2018 11:39 AM EDT ----- Stable labs

## 2018-10-25 MED ORDER — METOPROLOL SUCCINATE ER 50 MG PO TB24: 50.0000 mg | ORAL_TABLET | Freq: Every day | ORAL | 1 refills | Status: DC

## 2018-10-25 NOTE — Telephone Encounter (Signed)
Patient returned your call.

## 2018-10-25 NOTE — Telephone Encounter (Signed)
Pt advised ok to switch back to Toprol. She will stop Carvedilol today, take her last dose tonight and start Toprol tomorrow. Pt will call if no improvement in symptoms after change. Advised to call if issues w/ BP after medication change. Patient verbalized understanding and agreeable to plan.

## 2018-10-25 NOTE — Telephone Encounter (Signed)
Returned call to pt.   She begins by discussing the "heaviness in her legs" and "nausea" since she started Carvedilol.  She thought maybe it was her blood thinner and wanted to switch (reason we got lab work) but after thinking about it she feels that it started when she was switched to Carvedilol.  States she never felt like this when she was taking Xarelto and Toprol.  She would like to go back to Toprol to see if improvement in symptoms. She previously was taking Toprol 50 mg daily, this was d/c 10/20/2016, at [place] w/ Camnitz, and switched to Carvedilol Pt aware I will forward to Dr. Curt Bears for advisement and call her w/ recommendation.

## 2018-10-25 NOTE — Telephone Encounter (Signed)
Follow up ° ° °Patient is returning your call. Please call. ° ° ° °

## 2018-10-26 ENCOUNTER — Ambulatory Visit (INDEPENDENT_AMBULATORY_CARE_PROVIDER_SITE_OTHER): Payer: Medicare Other | Admitting: Family Medicine

## 2018-10-26 ENCOUNTER — Other Ambulatory Visit: Payer: Self-pay

## 2018-10-26 ENCOUNTER — Encounter: Payer: Self-pay | Admitting: Family Medicine

## 2018-10-26 VITALS — BP 160/90 | HR 71 | Temp 98.0°F | Ht 65.0 in | Wt 207.6 lb

## 2018-10-26 DIAGNOSIS — F4322 Adjustment disorder with anxiety: Secondary | ICD-10-CM | POA: Diagnosis not present

## 2018-10-26 MED ORDER — ESCITALOPRAM OXALATE 10 MG PO TABS: 10.0000 mg | ORAL_TABLET | Freq: Every day | ORAL | 3 refills | Status: DC

## 2018-10-26 MED ORDER — LORAZEPAM 0.5 MG PO TABS: 0.2500 mg | ORAL_TABLET | Freq: Three times a day (TID) | ORAL | 0 refills | Status: DC | PRN

## 2018-10-26 NOTE — Progress Notes (Signed)
Mary ServeSandra J Poole - 68 y.o. female MRN [id number]  Date of birth: 06/02/1950  Subjective Chief Complaint  Patient presents with  . Anxiety    pt states having some anxiety attacks/ trembling/ shortnessof breath/ comes and goes/ few friends passing/ pain in right arm comes and goes        Mary ServeSandra J Poole is a 68 y.o. female with history of A. Fib, subclinical hyperthyroidism and HTN here today to discuss anxiety.  She reports increased anxiety with several symptoms including worry, stomach cramps, hand shaking, chest tightness and intermittent heart fluttering.  She did discuss this with her cardiologist who recommended that she f/u with PCP to discuss anxiety.  She reports several events over the past year including multiple friends and family passing away, brother-in-law leaving her sister and providing support for her and dissatisfaction at work.  She has never taken anything for anxiety in the past.  She is active in church and prays and meditates frequently.  She would be open to trying medication for a short period of time and will look to see if she can fit therapy into her schedule.   Dep[facility] 2[facility]9 10/26/2018 07/20/2017  Decreased Interest 0 0  Down, Depressed, Hopeless 1 0  PHQ - 2 Score 1 0  Altered sleeping 3 -  Tired, decreased energy 3 -  Change in appetite 0 -  Feeling bad or failure about yourself  3 -  Trouble concentrating 0 -  Moving slowly or fidgety/restless 1 -  Suicidal thoughts 0 -  PHQ-9 Score 11 -  Difficult doing work/chores Somewhat difficult -   GAD 7 : Generalized Anxiety Score 10/26/2018  Nervous, Anxious, on Edge 3  Control/stop worrying 2  Worry too much - different things 3  Trouble relaxing 3  Restless 3  Easily annoyed or irritable 3  Afraid - awful might happen 3  Total GAD 7 Score 20  Anxiety Difficulty Somewhat difficult    ROS:  A comprehensive ROS was completed and negative except as noted per HPI   Allergies  Allergen Reactions   . Diphenhydramine Hcl Hives and Other (See Comments)    Erratic heartbeat  . Flecainide Rash    Past Medical History:  Diagnosis Date  . Anxiety   . Atrial fibrillation (HCC)   . Complication of anesthesia    "woke up during colonoscopy 02/2017" (11/09/2017)  . GERD (gastroesophageal reflux disease)   . H/O atrial flutter   . History of blood transfusion 02/2017   LGIB (11/09/2017)  . Hypertension   . Migraine    "a few/year" (11/09/2017)  . OSA on CPAP     Past Surgical History:  Procedure Laterality Date  . ATRIAL FIBRILLATION ABLATION N/A 11/09/2017   Procedure: ATRIAL FIBRILLATION ABLATION;  Surgeon: Regan Lemmingamnitz, Will Martin, MD;  Location: [facility];  Service: Cardiovascular;  Laterality: N/A;  . COLONOSCOPY N/A 02/24/2017   Procedure: COLONOSCOPY;  Surgeon: Jeani HawkingHung, Patrick, MD;  [facility];  Service: Gastroenterology;  Laterality: N/A;  . ENTEROSCOPY N/A 02/24/2017   Procedure: ENTEROSCOPY;  Surgeon: Jeani HawkingHung, Patrick, MD;  [facility];  Service: Gastroenterology;  Laterality: N/A;  . HERNIA REPAIR  1960s  . WISDOM TOOTH EXTRACTION      Social History   Socioeconomic History  . Marital status: Married    Spouse name: Not on file  . Number of children: Not on file  . Years of education: Not on file  . Highest education level: Not on  file  Occupational History  . Not on file  Social Needs  . Financial resource strain: Not hard at all  . Food insecurity    Worry: Never true    Inability: Never true  . Transportation needs    Medical: No    Non-medical: No  Tobacco Use  . Smoking status: Former Smoker    Packs/day: 0.25    Years: 10.00    Pack years: 2.50    Types: Cigarettes  . Smokeless tobacco: Never Used  . Tobacco comment: 11/09/2017 "quit smoking in the 1980s"  Substance and Sexual Activity  . Alcohol use: Never    Frequency: Never  . Drug use: Never  . Sexual activity: Not Currently  Lifestyle  . Physical activity    Days per  week: Not on file    Minutes per session: Not on file  . Stress: Not on file  Relationships  . Social Herbalist on phone: Not on file    Gets together: Not on file    Attends religious service: Not on file    Active member of club or organization: Not on file    Attends meetings of clubs or organizations: Not on file    Relationship status: Not on file  Other Topics Concern  . Not on file  Social History Narrative  . Not on file    Family History  Problem Relation Age of Onset  . CAD Father   . AAA (abdominal aortic aneurysm) Sister   . Thyroid disease Sister   . Allergies Other   . Clotting disorder Other   . Hypertension Other   . Heart disease Other   . Rheum arthritis Other   . Migraines Other     Health Maintenance  Topic Date Due  . Hepatitis C Screening  12-28-1950  . TETANUS/TDAP  10/10/1969  . MAMMOGRAM  10/10/2000  . DEXA SCAN  10/11/2015  . PNA vac Low Risk Adult (1 of 2 - PCV13) 10/11/2015  . INFLUENZA VACCINE  09/09/2018  . COLONOSCOPY  02/25/2027    ----------------------------------------------------------------------------------------------------------------------------------------------------------------------------------------------------------------- Physical Exam BP (!) 160/90   Pulse 71   Temp 98 F (36.7 C) (Oral)   Ht 5\' 5"  (1.651 m)   Wt 207 lb 9.6 oz (94.2 kg)   SpO2 98%   BMI 34.55 kg/m   Physical Exam Constitutional:      Appearance: Normal appearance.  HENT:     Head: Normocephalic and atraumatic.     Mouth/Throat:     Mouth: Mucous membranes are moist.  Eyes:     General: No scleral icterus. Cardiovascular:     Rate and Rhythm: Normal rate and regular rhythm.  Pulmonary:     Effort: Pulmonary effort is normal.     Breath sounds: Normal breath sounds.  Skin:    General: Skin is warm and dry.  Neurological:     General: No focal deficit present.  Psychiatric:        Behavior: Behavior normal.      ------------------------------------------------------------------------------------------------------------------------------------------------------------------------------------------------------------------- Assessment and Plan  Adjustment disorder with anxiety -GAD7 scores are quite high with symptoms of panic disorder as well.  -We had a long discussion regarding management of her anxiety today including medication options and counseling.  -She has good support from church, family and friends.  Recommend continued prayer and meditation.  -She agrees to start lexapro and will add on ativan as needed for panic/severe anxiety until lexapro is working well for her.   -Given  information of counseling -F/u in 4-6 weeks.   >25 minutes spent with patient with 50% of time spent providing counseling as documented above.

## 2018-10-26 NOTE — Assessment & Plan Note (Signed)
-  GAD7 scores are quite high with symptoms of panic disorder as well.  -We had a long discussion regarding management of her anxiety today including medication options and counseling.  -She has good support from church, family and friends.  Recommend continued prayer and meditation.  -She agrees to start lexapro and will add on ativan as needed for panic/severe anxiety until lexapro is working well for her.   -Given information of counseling -F/u in 4-6 weeks.

## 2018-10-26 NOTE — Patient Instructions (Signed)

## 2018-10-27 ENCOUNTER — Telehealth: Payer: Self-pay | Admitting: Cardiology

## 2018-10-27 ENCOUNTER — Ambulatory Visit: Payer: Medicare Other | Admitting: Family Medicine

## 2018-10-27 NOTE — Telephone Encounter (Signed)
°  ° ° °*  STAT* If patient is at the pharmacy, call can be transferred to refill team.   1. Which medications need to be refilled? (please list name of each medication and dose if known) Eliquis  2. Which pharmacy/location (including street and city if local pharmacy) is medication to be sent to?  3. Do they need a 30 day or 90 day supply? 90    Patient is having a hard time getting xarelto filled at her pharmacy. She wanted to know if Sherri or Dr. Curt Bears would send a prescription for eliquis to her pharmacy. Patient states that she already talked with Dr. Curt Bears about Eliquis.  She has about 3 days worth of Xarelto left, and will be out of her medicine on Monday.

## 2018-10-30 MED ORDER — APIXABAN 5 MG PO TABS: 5.0000 mg | ORAL_TABLET | Freq: Two times a day (BID) | ORAL | 3 refills | Status: DC

## 2018-10-30 NOTE — Telephone Encounter (Signed)
Informed ok to switch to Eliquis.  She states it is cheaper for this than Xarelto Pt aware 30 day free card left downstairs and she understands to let me know if not affordable so that we can work on cost while she had the free medication. Patient verbalized understanding and agreeable to plan.  Pt will take her last pill of Xarelto tonight and start the Eliquis tomorrow.

## 2018-10-30 NOTE — Addendum Note (Signed)
Addended by: Stanton Kidney on: 10/30/2018 01:46 PM   Modules accepted: Orders

## 2018-11-01 ENCOUNTER — Ambulatory Visit: Payer: Medicare Other | Admitting: Internal Medicine

## 2018-11-02 NOTE — Telephone Encounter (Signed)
Follow Up   Patient states that she has some questions about the medication changes. Please give patient a call back.

## 2018-11-02 NOTE — Telephone Encounter (Signed)
Pt reporting HR increasing into the 60s since stopping Carvedilol and restarting Toprol.  This concerns her.  Informed pt that HRs reported are normal, educated normal HR range.  Explained that Carvedilol may have been helping keep HR in the 50s, which she was before Toprol, but that 60s is ok and not to worry. She appreciates better understanding of this.

## 2018-11-28 DIAGNOSIS — G4733 Obstructive sleep apnea (adult) (pediatric): Secondary | ICD-10-CM | POA: Diagnosis not present

## 2018-11-28 DIAGNOSIS — I10 Essential (primary) hypertension: Secondary | ICD-10-CM | POA: Diagnosis not present

## 2018-11-29 ENCOUNTER — Telehealth: Payer: Self-pay | Admitting: Cardiology

## 2018-11-29 NOTE — Telephone Encounter (Signed)
New message:     Patient would like for you to call her.

## 2018-11-30 NOTE — Telephone Encounter (Signed)
Pt calling cause she is in the doughnut hole and her Eliquis is "expensive the rest of this year". Several weeks of samples left downstairs for pt to pick up at her convenience. Pt appreciative of the help with this.

## 2018-12-05 ENCOUNTER — Telehealth: Payer: Self-pay

## 2018-12-05 NOTE — Telephone Encounter (Signed)
Questions for Screening COVID-19  Symptom onset: N/A  Travel or Contacts: No  During this illness, did/does the patient experience any of the following symptoms? Fever >100.4F []  Yes [x]  No []  Unknown Subjective fever (felt feverish) []  Yes [x]  No []  Unknown Chills []  Yes [x]  No []  Unknown Muscle aches (myalgia) []  Yes [x]  No []  Unknown Runny nose (rhinorrhea) []  Yes [x]  No []  Unknown Sore throat []  Yes [x]  No []  Unknown Cough (new onset or worsening of chronic cough) []  Yes [x]  No []  Unknown Shortness of breath (dyspnea) []  Yes [x]  No []  Unknown Nausea or vomiting []  Yes [x]  No []  Unknown Headache []  Yes [x]  No []  Unknown Abdominal pain  []  Yes [x]  No []  Unknown Diarrhea (?3 loose/looser than normal stools/24hr period) []  Yes [x]  No []  Unknown     

## 2018-12-06 ENCOUNTER — Encounter: Payer: Self-pay | Admitting: Family Medicine

## 2018-12-06 ENCOUNTER — Telehealth (INDEPENDENT_AMBULATORY_CARE_PROVIDER_SITE_OTHER): Payer: Medicare Other | Admitting: Family Medicine

## 2018-12-06 ENCOUNTER — Other Ambulatory Visit: Payer: Self-pay

## 2018-12-06 DIAGNOSIS — F4322 Adjustment disorder with anxiety: Secondary | ICD-10-CM

## 2018-12-06 NOTE — Assessment & Plan Note (Signed)
-  Doing well at this time, will continue current medications.  Using lorazepam sparingly.  -F/u in 3 months.

## 2018-12-06 NOTE — Progress Notes (Signed)
Mary Poole - 68 y.o. female MRN [id number]  Date of birth: 11-01-1950   This visit type was conducted due to national recommendations for restrictions regarding the COVID-19 Pandemic (e.g. social distancing).  This format is felt to be most appropriate for this patient at this time.  All issues noted in this document were discussed and addressed.  No physical exam was performed (except for noted visual exam findings with Video Visits).  I discussed the limitations of evaluation and management by telemedicine and the availability of in person appointments. The patient expressed understanding and agreed to proceed.  I connected with@ on 12/06/18 at  1:00 PM EDT by a video enabled telemedicine application and verified that I am speaking with the correct person using two identifiers.  Present for visit: Mary Poole   Patient Location: [place] [address]   Provider location:   [facility]   Chief Complaint  Patient presents with  . Follow-up    HPI  Mary Poole is a 68 y.o. female who presents via audio/video conferencing for a telehealth visit today.  She is following up today for anxiety.  Started on lexapro with lorazepam prn as previous visit.  Reports she is doing well at this time.  She did have quite a bit of nausea when first starting but this has improved.  She has only used lorazepam twice.  She reports that some of the events in her life have settled down some so that has helped.   ROS:  A comprehensive ROS was completed and negative except as noted per HPI  Past Medical History:  Diagnosis Date  . Anxiety   . Atrial fibrillation ([place])   . Complication of anesthesia    "woke up during colonoscopy 02/2017" (11/09/2017)  . GERD (gastroesophageal reflux disease)   . H/O atrial flutter   . History of blood transfusion 02/2017   LGIB (11/09/2017)  . Hypertension   . Migraine    "a few/year" (11/09/2017)  . OSA on CPAP      Past Surgical History:  Procedure Laterality Date  . ATRIAL FIBRILLATION ABLATION N/A 11/09/2017   Procedure: ATRIAL FIBRILLATION ABLATION;  Surgeon: Constance Haw, MD;  Location: [place] [facility];  Service: Cardiovascular;  Laterality: N/A;  . COLONOSCOPY N/A 02/24/2017   Procedure: COLONOSCOPY;  Surgeon: Carol Ada, MD;  Location: [place];  Service: Gastroenterology;  Laterality: N/A;  . ENTEROSCOPY N/A 02/24/2017   Procedure: ENTEROSCOPY;  Surgeon: Carol Ada, MD;  Location: [place];  Service: Gastroenterology;  Laterality: N/A;  . HERNIA REPAIR  1960s  . WISDOM TOOTH EXTRACTION      Family History  Problem Relation Age of Onset  . CAD Father   . AAA (abdominal aortic aneurysm) Sister   . Thyroid disease Sister   . Allergies Other   . Clotting disorder Other   . Hypertension Other   . Heart disease Other   . Rheum arthritis Other   . Migraines Other     Social History   Socioeconomic History  . Marital status: Married    Spouse name: Not on file  . Number of children: Not on file  . Years of education: Not on file  . Highest education level: Not on file  Occupational History  . Not on file  Social Needs  . Financial resource strain: Not hard at all  . Food insecurity    Worry: Never true    Inability: Never true  .  Transportation needs    Medical: No    Non-medical: No  Tobacco Use  . Smoking status: Former Smoker    Packs/day: 0.25    Years: 10.00    Pack years: 2.50    Types: Cigarettes  . Smokeless tobacco: Never Used  . Tobacco comment: 11/09/2017 "quit smoking in the 1980s"  Substance and Sexual Activity  . Alcohol use: Never    Frequency: Never  . Drug use: Never  . Sexual activity: Not Currently  Lifestyle  . Physical activity    Days per week: Not on file    Minutes per session: Not on file  . Stress: Not on file  Relationships  . Social Musician on phone: Not on file    Gets together: Not on file    Attends  religious service: Not on file    Active member of club or organization: Not on file    Attends meetings of clubs or organizations: Not on file    Relationship status: Not on file  . Intimate partner violence    Fear of current or ex partner: Not on file    Emotionally abused: Not on file    Physically abused: Not on file    Forced sexual activity: Not on file  Other Topics Concern  . Not on file  Social History Narrative  . Not on file     Current Outpatient Medications:  .  apixaban (ELIQUIS) 5 MG TABS tablet, Take 1 tablet (5 mg total) by mouth 2 (two) times daily., Disp: 60 tablet, Rfl: 3 .  diltiazem (CARDIZEM CD) 240 MG 24 hr capsule, Take 1 capsule (240 mg total) by mouth daily., Disp: 90 capsule, Rfl: 1 .  escitalopram (LEXAPRO) 10 MG tablet, Take 1 tablet (10 mg total) by mouth daily. Take 1/2 tab for first 7 days then increase to whole tab, Disp: 30 tablet, Rfl: 3 .  furosemide (LASIX) 20 MG tablet, Take 1 tablet by mouth daily for the next 3 days then as needed for swelling, Disp: 20 tablet, Rfl: 2 .  LORazepam (ATIVAN) 0.5 MG tablet, Take 0.5-1 tablets (0.25-0.5 mg total) by mouth every 8 (eight) hours as needed for anxiety (or panic)., Disp: 30 tablet, Rfl: 0 .  Multiple Vitamin (MULTIVITAMIN) LIQD, Take 30 mLs by mouth daily., Disp: , Rfl:  .  omeprazole (PRILOSEC) 40 MG capsule, TAKE 1 CAPSULE BY MOUTH EVERY DAY, Disp: 30 capsule, Rfl: 3 .  PRESCRIPTION MEDICATION, Inhale into the lungs at bedtime. CPAP, Disp: , Rfl:  .  metoprolol succinate (TOPROL-XL) 50 MG 24 hr tablet, Take 1 tablet (50 mg total) by mouth daily. Take with or immediately following a meal. (Patient not taking: Reported on 10/26/2018), Disp: 90 tablet, Rfl: 1  EXAM:  VITALS per patient if applicable: BP (!) 158/78 Comment: per pt  Pulse 61 Comment: per pt.  Temp (!) 97.3 F (36.3 C) (Tympanic) Comment: per pt. Comment (Src): per pt.  Ht 5\' 5"  (1.651 m)   Wt 209 lb (94.8 kg) Comment: per pt.  BMI  34.78 kg/m   GENERAL: alert, oriented, appears well and in no acute distress  HEENT: atraumatic, conjunttiva clear, no obvious abnormalities on inspection of external nose and ears  NECK: normal movements of the head and neck  LUNGS: on inspection no signs of respiratory distress, breathing rate appears normal, no obvious gross SOB, gasping or wheezing  CV: no obvious cyanosis  MS: moves all visible extremities without noticeable abnormality  PSYCH/NEURO: pleasant and cooperative, no obvious depression or anxiety, speech and thought processing grossly intact  ASSESSMENT AND PLAN:  Discussed the following assessment and plan:  Adjustment disorder with anxiety -Doing well at this time, will continue current medications.  Using lorazepam sparingly.  -F/u in 3 months.         I discussed the assessment and treatment plan with the patient. The patient was provided an opportunity to ask questions and all were answered. The patient agreed with the plan and demonstrated an understanding of the instructions.   The patient was advised to call back or seek an in-person evaluation if the symptoms worsen or if the condition fails to improve as anticipated.    Everrett Coombeody Nahomi Hegner, DO

## 2018-12-12 ENCOUNTER — Other Ambulatory Visit: Payer: Self-pay

## 2018-12-12 ENCOUNTER — Encounter: Payer: Self-pay | Admitting: Cardiology

## 2018-12-12 ENCOUNTER — Ambulatory Visit: Payer: Medicare Other | Admitting: Cardiology

## 2018-12-12 VITALS — BP 136/82 | HR 67 | Ht 65.0 in | Wt 210.6 lb

## 2018-12-12 DIAGNOSIS — I480 Paroxysmal atrial fibrillation: Secondary | ICD-10-CM | POA: Diagnosis not present

## 2018-12-12 MED ORDER — LOSARTAN POTASSIUM 50 MG PO TABS: 50.0000 mg | ORAL_TABLET | Freq: Every day | ORAL | 6 refills | Status: DC

## 2018-12-12 NOTE — Progress Notes (Signed)
Electrophysiology Office Note   Date:  12/12/2018   ID:  Mary Poole, DOB [birth year], MRN [id number]  PCP:  Everrett Coombe, DO  Cardiologist:  Katrinka Blazing Primary Electrophysiologist:  Lerone Onder Jorja Loa, MD    No chief complaint on file.    History of Present Illness: Mary Poole is a 68 y.o. female who is being seen today for the evaluation of atrial flutter at the request of Everrett Coombe, DO. Presenting today for electrophysiology evaluation. She has a history of hypertension and palpitations. She was diagnosed with atrial fibrillation on 07/18/16. She had 2 episodes of chest pressure with radiation to her arm in late May or early June. It lasted 20 minutes. EMS was called but she felt better. 10 days later, she had similar symptoms.  Went to the emergency room where she had a wide-complex tachycardia.  While in the emergency room, her rate slowed and showed a likely atrial flutter.  It was thought that this was the same rhythm with the wide-complex tachycardia being apparent 1-1 conduction.  She reverted to sinus rhythm with IV medications.    Today, denies symptoms of palpitations, chest pain, shortness of breath, orthopnea, PND, lower extremity edema, claudication, dizziness, presyncope, syncope, bleeding, or neurologic sequela. The patient is tolerating medications without difficulties.  She is overall feeling well.  She has no chest pain or shortness of breath.  She is able to do all her daily activities.  She does note that her blood pressure has been consistently in the high 130s to 140s at home.  She has gained some weight back since she has been working at home.  She was previously down approximately 15 pounds.   Past Medical History:  Diagnosis Date  . Anxiety   . Atrial fibrillation (HCC)   . Complication of anesthesia    "woke up during colonoscopy 02/2017" (11/09/2017)  . GERD (gastroesophageal reflux disease)   . H/O atrial flutter   . History of blood transfusion  02/2017   LGIB (11/09/2017)  . Hypertension   . Migraine    "a few/year" (11/09/2017)  . OSA on CPAP    Past Surgical History:  Procedure Laterality Date  . ATRIAL FIBRILLATION ABLATION N/A 11/09/2017   Procedure: ATRIAL FIBRILLATION ABLATION;  Surgeon: Regan Lemming, MD;  [facility];  Service: Cardiovascular;  Laterality: N/A;  . COLONOSCOPY N/A 02/24/2017   Procedure: COLONOSCOPY;  Surgeon: Jeani Hawking, MD;  Location: [facility] ENDOSCOPY;  Service: Gastroenterology;  Laterality: N/A;  . ENTEROSCOPY N/A 02/24/2017   Procedure: ENTEROSCOPY;  Surgeon: Jeani Hawking, MD;  Location: [facility] ENDOSCOPY;  Service: Gastroenterology;  Laterality: N/A;  . HERNIA REPAIR  1960s  . WISDOM TOOTH EXTRACTION       Current Outpatient Medications  Medication Sig Dispense Refill  . apixaban (ELIQUIS) 5 MG TABS tablet Take 1 tablet (5 mg total) by mouth 2 (two) times daily. 60 tablet 3  . diltiazem (CARDIZEM CD) 240 MG 24 hr capsule Take 1 capsule (240 mg total) by mouth daily. 90 capsule 1  . escitalopram (LEXAPRO) 10 MG tablet Take 10 mg by mouth daily.    . furosemide (LASIX) 20 MG tablet Take 1 tablet by mouth daily for the next 3 days then as needed for swelling 20 tablet 2  . LORazepam (ATIVAN) 0.5 MG tablet Take 0.5-1 tablets (0.25-0.5 mg total) by mouth every 8 (eight) hours as needed for anxiety (or panic). 30 tablet 0  . metoprolol succinate (TOPROL-XL) 50 MG 24  hr tablet Take 1 tablet (50 mg total) by mouth daily. Take with or immediately following a meal. 90 tablet 1  . Multiple Vitamin (MULTIVITAMIN) LIQD Take 30 mLs by mouth daily.    Marland Kitchen omeprazole (PRILOSEC) 40 MG capsule TAKE 1 CAPSULE BY MOUTH EVERY DAY 30 capsule 3  . PRESCRIPTION MEDICATION Inhale into the lungs at bedtime. CPAP     No current facility-administered medications for this visit.     Allergies:   Diphenhydramine hcl and Flecainide   Social History:  The patient  reports that she has quit smoking. Her smoking  use included cigarettes. She has a 2.50 pack-year smoking history. She has never used smokeless tobacco. She reports that she does not drink alcohol or use drugs.   Family History:  The patient's family history includes AAA (abdominal aortic aneurysm) in her sister; Allergies in an other family member; CAD in her father; Clotting disorder in an other family member; Heart disease in an other family member; Hypertension in an other family member; Migraines in an other family member; Rheum arthritis in an other family member; Thyroid disease in her sister.   ROS:  Please see the history of present illness.   Otherwise, review of systems is positive for none.   All other systems are reviewed and negative.   PHYSICAL EXAM: VS:  BP 136/82   Pulse 67   Ht 5\' 5"  (1.651 m)   Wt 210 lb 9.6 oz (95.5 kg)   SpO2 99%   BMI 35.05 kg/m  , BMI Body mass index is 35.05 kg/m. GEN: Well nourished, well developed, in no acute distress  HEENT: normal  Neck: no JVD, carotid bruits, or masses Cardiac: RRR; no murmurs, rubs, or gallops,no edema  Respiratory:  clear to auscultation bilaterally, normal work of breathing GI: soft, nontender, nondistended, + BS MS: no deformity or atrophy  Skin: warm and dry Neuro:  Strength and sensation are intact Psych: euthymic mood, full affect  EKG:  EKG is ordered today. Personal review of the ekg ordered shows sinus rhythm, LVH, rate 67  Recent Labs: 09/12/2018: TSH 0.37 10/11/2018: BUN 16; Creatinine, Ser 1.00; Hemoglobin 12.2; Platelets 263; Potassium 4.4; Sodium 142    Lipid Panel  No results found for: CHOL, TRIG, HDL, CHOLHDL, VLDL, LDLCALC, LDLDIRECT   Wt Readings from Last 3 Encounters:  12/12/18 210 lb 9.6 oz (95.5 kg)  12/06/18 209 lb (94.8 kg)  10/26/18 207 lb 9.6 oz (94.2 kg)      Other studies Reviewed: Additional studies/ records that were reviewed today include: Myoview 10/06/16  Review of the above records today demonstrates:   Nuclear stress  EF: 58%. The left ventricular ejection fraction is normal (55-65%).  The study is normal. There is breast attenuation. No ischemia . no evidence of infarction  This is a low risk study.  Holter 10/09/16 - personally reviewed  Basic rhythm is NSR rare PAC's and PVC's.  Brief SVT < 10 beats.  Heart rate range 43-113 bpm with average 63 bpm.   NSR Normal study  TTE 07/19/16 LEFT VENTRICLE The left ventricle is borderline dilated. There is normal left ventricular  wall thickness. Left ventricular systolic function is normal. LV ejection  fraction = 50-55%. Left ventricular filling pattern is indeterminate. No  segmental wall motion abnormalities seen in the left ventricle. LEFT ATRIUM The left atrium is mildly dilated. AORTIC VALVE The aortic valve is not well visualized. There is no aortic regurgitation.    ASSESSMENT AND PLAN:  1.  Atrial fibrillation/flutter: Status post ablation 11/09/2017.  Currently on Eliquis, diltiazem, metoprolol.  Minimal atrial fibrillation since ablation.  No changes.  This patients CHA2DS2-VASc Score and unadjusted Ischemic Stroke Rate (% per year) is equal to 3.2 % stroke rate/year from a score of 3  Above score calculated as 1 point each if present [CHF, HTN, DM, Vascular=MI/PAD/Aortic Plaque, Age if 65-74, or Female] Above score calculated as 2 points each if present [Age > 75, or Stroke/TIA/TE]   2. Short PR interval: No accessory pathway found at EP study.  No changes.  3. Hypertension: Mildly elevated today and is elevated at home.  Alejandro Gamel start 50 mg of losartan.  4. OSA: CPAP compliance encouraged    Current medicines are reviewed at length with the patient today.   The patient does not have concerns regarding her medicines.  The following changes were made today: Start losartan  Labs/ tests ordered today include:  Orders Placed This Encounter  Procedures  . EKG 12-Lead     Disposition:   FU with Milika Ventress 6 months  Signed,  Manford Sprong Jorja LoaMartin Lafayette Dunlevy, MD  12/12/2018 8:56 AM     [facility] [address] [place] [address] [phone] (office) [phone] (fax)

## 2018-12-12 NOTE — Patient Instructions (Signed)
Medication Instructions:  Your physician has recommended you make the following change in your medication:  1. START Losartan 50 mg daily  * If you need a refill on your cardiac medications before your next appointment, please call your pharmacy.   Labwork: None ordered  Testing/Procedures: None ordered  Follow-Up: At [facility], you and your health needs are our priority.  As part of our continuing mission to provide you with exceptional heart care, we have created designated Provider Care Teams.  These Care Teams include your primary Cardiologist (physician) and Advanced Practice Providers (APPs -  Physician Assistants and Nurse Practitioners) who all work together to provide you with the care you need, when you need it.  You will need a follow up appointment in 6 months.  Please call our office 2 months in advance to schedule this appointment.  You may see Dr Curt Bears or one of the following Advanced Practice Providers on your designated Care Team:    Chanetta Marshall, NP  Tommye Standard, PA-C  Oda Kilts, [place]  Thank you for choosing [facility]   Trinidad Curet, RN [phone]  Any Other Special Instructions Will Be Listed Below (If Applicable).  Losartan tablets What is this medicine? LOSARTAN (loe SAR tan) is used to treat high blood pressure and to reduce the risk of stroke in certain patients. This drug also slows the progression of kidney disease in patients with diabetes. This medicine may be used for other purposes; ask your health care provider or pharmacist if you have questions. COMMON BRAND NAME(S): Cozaar What should I tell my health care provider before I take this medicine? They need to know if you have any of these conditions:  heart failure  kidney or liver disease  an unusual or allergic reaction to losartan, other medicines, foods, dyes, or preservatives  pregnant or trying to get pregnant  breast-feeding How should I use this medicine? Take  this medicine by mouth with a glass of water. Follow the directions on the prescription label. This medicine can be taken with or without food. Take your doses at regular intervals. Do not take your medicine more often than directed. Talk to your pediatrician regarding the use of this medicine in children. Special care may be needed. Overdosage: If you think you have taken too much of this medicine contact a poison control center or emergency room at once. NOTE: This medicine is only for you. Do not share this medicine with others. What if I miss a dose? If you miss a dose, take it as soon as you can. If it is almost time for your next dose, take only that dose. Do not take double or extra doses. What may interact with this medicine?  blood pressure medicines  diuretics, especially triamterene, spironolactone, or amiloride  fluconazole  NSAIDs, medicines for pain and inflammation, like ibuprofen or naproxen  potassium salts or potassium supplements  rifampin This list may not describe all possible interactions. Give your health care provider a list of all the medicines, herbs, non-prescription drugs, or dietary supplements you use. Also tell them if you smoke, drink alcohol, or use illegal drugs. Some items may interact with your medicine. What should I watch for while using this medicine? Visit your doctor or health care professional for regular checks on your progress. Check your blood pressure as directed. Ask your doctor or health care professional what your blood pressure should be and when you should contact him or her. Call your doctor or health care  professional if you notice an irregular or fast heart beat. Women should inform their doctor if they wish to become pregnant or think they might be pregnant. There is a potential for serious side effects to an unborn child, particularly in the second or third trimester. Talk to your health care professional or pharmacist for more  information. You may get drowsy or dizzy. Do not drive, use machinery, or do anything that needs mental alertness until you know how this drug affects you. Do not stand or sit up quickly, especially if you are an older patient. This reduces the risk of dizzy or fainting spells. Alcohol can make you more drowsy and dizzy. Avoid alcoholic drinks. Avoid salt substitutes unless you are told otherwise by your doctor or health care professional. Do not treat yourself for coughs, colds, or pain while you are taking this medicine without asking your doctor or health care professional for advice. Some ingredients may increase your blood pressure. What side effects may I notice from receiving this medicine? Side effects that you should report to your doctor or health care professional as soon as possible:  confusion, dizziness, light headedness or fainting spells  decreased amount of urine passed  difficulty breathing or swallowing, hoarseness, or tightening of the throat  fast or irregular heart beat, palpitations, or chest pain  skin rash, itching  swelling of your face, lips, tongue, hands, or feet Side effects that usually do not require medical attention (report to your doctor or health care professional if they continue or are bothersome):  cough  decreased sexual function or desire  headache  nasal congestion or stuffiness  nausea or stomach pain  sore or cramping muscles This list may not describe all possible side effects. Call your doctor for medical advice about side effects. You may report side effects to FDA at 1-800-FDA-1088. Where should I keep my medicine? Keep out of the reach of children. Store at room temperature between 15 and 30 degrees C (59 and 86 degrees F). Protect from light. Keep container tightly closed. Throw away any unused medicine after the expiration date. NOTE: This sheet is a summary. It may not cover all possible information. If you have questions about  this medicine, talk to your doctor, pharmacist, or health care provider.  2020 Elsevier/Gold Standard (2007-04-07 16:42:18)

## 2018-12-26 NOTE — Progress Notes (Signed)
Virtual Visit via Video Note  I connected with patient on 12/27/18 at  3:15 PM EST by audio enabled telemedicine application and verified that I am speaking with the correct person using two identifiers.   THIS ENCOUNTER IS A VIRTUAL VISIT DUE TO COVID-19 - PATIENT WAS NOT SEEN IN THE OFFICE. PATIENT HAS CONSENTED TO VIRTUAL VISIT / TELEMEDICINE VISIT   Location of patient: home  Location of provider: office  I discussed the limitations of evaluation and management by telemedicine and the availability of in person appointments. The patient expressed understanding and agreed to proceed.   Subjective:   Mary Poole is a 68 y.o. female who presents for Medicare Annual (Initial) preventive examination.  The Patient was informed that the wellness visit is to identify future health risk and educate and initiate measures that can reduce risk for increased disease through the lifespan.   Describes health as fair, good or great? good  Review of Systems:  Home Safety/Smoke Alarms: Feels safe in home. Smoke alarms in place.   Lives with husband in 1 story home.  5 steps going into home with handrails. Pt states she does well with stairs.  Pt states she works for [employer]. Currently working from home. Still 40hrs/ wk.  Female:   Mammo- pt states she will schedule 2021      Dexa scan-pt states she will schedule 2021             CCS- 02/24/17. Recall 10 yrs  Objective:     Vitals: Unable to assess. This visit is enabled though telemedicine due to Covid 19.   Advanced Directives 12/27/2018 11/09/2017 11/09/2017 10/30/2017 10/30/2017 10/14/2017 09/18/2017  Does Patient Have a Medical Advance Directive? No No No No No No No  Would patient like information on creating a medical advance directive? No - Patient declined No - Patient declined No - Patient declined No - Patient declined No - Patient declined No - Patient declined No - Patient declined    Tobacco Social History   Tobacco Use   Smoking Status Former Smoker  . Packs/day: 0.25  . Years: 10.00  . Pack years: 2.50  . Types: Cigarettes  Smokeless Tobacco Never Used  Tobacco Comment   11/09/2017 "quit smoking in the 1980s"     Counseling given: Not Answered Comment: 11/09/2017 "quit smoking in the 1980s"   Clinical Intake: Pain : No/denies pain      Past Medical History:  Diagnosis Date  . Anxiety   . Atrial fibrillation (HCC)   . Complication of anesthesia    "woke up during colonoscopy 02/2017" (11/09/2017)  . GERD (gastroesophageal reflux disease)   . H/O atrial flutter   . History of blood transfusion 02/2017   LGIB (11/09/2017)  . Hypertension   . Migraine    "a few/year" (11/09/2017)  . OSA on CPAP    Past Surgical History:  Procedure Laterality Date  . ATRIAL FIBRILLATION ABLATION N/A 11/09/2017   Procedure: ATRIAL FIBRILLATION ABLATION;  Surgeon: Regan Lemming, MD;  Location: [facility] INVAS[facility] CV LAB;  Service: Cardiovascular;  Laterality: N/A;  . COLONOSCOPY N/A 02/24/2017   Procedure: COLONOSCOPY;  Surgeon: Jeani Hawking, MD;  Location[facility] ENDOSCOPY;  Service: Gastroenterology;  Laterality: N/A;  . ENTEROSCOPY N/A 02/24/2017   Procedure: ENTEROSCOPY;  Surgeon: Jeani Hawking, MD;  Location: [facility] ENDOSCOPY;  Service: Gastroenterology;  Laterality: N/A;  . HERNIA REPAIR  1960s  . WISDOM TOOTH EXTRACTION     Family History  Problem Relation Age  of Onset  . CAD Father   . AAA (abdominal aortic aneurysm) Sister   . Thyroid disease Sister   . Allergies Other   . Clotting disorder Other   . Hypertension Other   . Heart disease Other   . Rheum arthritis Other   . Migraines Other    Social History   Socioeconomic History  . Marital status: Married    Spouse name: Not on file  . Number of children: Not on file  . Years of education: Not on file  . Highest education level: Not on file  Occupational History  . Not on file  Social Needs  . Financial resource strain: Not hard at all  . Food  insecurity    Worry: Never true    Inability: Never true  . Transportation needs    Medical: No    Non-medical: No  Tobacco Use  . Smoking status: Former Smoker    Packs/day: 0.25    Years: 10.00    Pack years: 2.50    Types: Cigarettes  . Smokeless tobacco: Never Used  . Tobacco comment: 11/09/2017 "quit smoking in the 1980s"  Substance and Sexual Activity  . Alcohol use: Never    Frequency: Never  . Drug use: Never  . Sexual activity: Not Currently  Lifestyle  . Physical activity    Days per week: Not on file    Minutes per session: Not on file  . Stress: Not on file  Relationships  . Social Herbalist on phone: Not on file    Gets together: Not on file    Attends religious service: Not on file    Active member of club or organization: Not on file    Attends meetings of clubs or organizations: Not on file    Relationship status: Not on file  Other Topics Concern  . Not on file  Social History Narrative  . Not on file    Outpatient Encounter Medications as of 12/27/2018  Medication Sig  . apixaban (ELIQUIS) 5 MG TABS tablet Take 1 tablet (5 mg total) by mouth 2 (two) times daily.  Marland Kitchen diltiazem (CARDIZEM CD) 240 MG 24 hr capsule Take 1 capsule (240 mg total) by mouth daily.  Marland Kitchen escitalopram (LEXAPRO) 10 MG tablet Take 10 mg by mouth daily.  . furosemide (LASIX) 20 MG tablet Take 1 tablet by mouth daily for the next 3 days then as needed for swelling  . LORazepam (ATIVAN) 0.5 MG tablet Take 0.5-1 tablets (0.25-0.5 mg total) by mouth every 8 (eight) hours as needed for anxiety (or panic).  Marland Kitchen losartan (COZAAR) 50 MG tablet Take 1 tablet (50 mg total) by mouth daily.  . metoprolol succinate (TOPROL-XL) 50 MG 24 hr tablet Take 1 tablet (50 mg total) by mouth daily. Take with or immediately following a meal.  . Multiple Vitamin (MULTIVITAMIN) LIQD Take 30 mLs by mouth daily.  Marland Kitchen omeprazole (PRILOSEC) 40 MG capsule TAKE 1 CAPSULE BY MOUTH EVERY DAY  . PRESCRIPTION  MEDICATION Inhale into the lungs at bedtime. CPAP   No facility-administered encounter medications on file as of 12/27/2018.     Activities of Daily Living In your present state of health, do you have any difficulty performing the following activities: 12/27/2018  Hearing? N  Vision? N  Difficulty concentrating or making decisions? N  Walking or climbing stairs? N  Dressing or bathing? N  Doing errands, shopping? N  Preparing Food and eating ? N  Using the  Toilet? N  In the past six months, have you accidently leaked urine? N  Do you have problems with loss of bowel control? N  Managing your Medications? N  Managing your Finances? N  Housekeeping or managing your Housekeeping? N  Some recent data might be hidden    Patient Care Team: Everrett CoombeMatthews, Cody, DO as PCP - General (Family Medicine) Regan Lemmingamnitz, Will Martin, MD as PCP - Electrophysiology (Cardiology)    Assessment:   This is a routine wellness examination for Dois DavenportSandra. Physical assessment deferred to PCP.   Exercise Activities and Dietary recommendations Current Exercise Habits: The patient does not participate in regular exercise at present, Exercise limited by: None identified   Diet (meal preparation, eat out, water intake, caffeinated beverages, dairy products, fruits and vegetables): 24 hr recall Breakfast: egg, skim milk Lunch: chicken salad and water Dinner:   Cabbage, pintos, chopped steak   Goals    . Increase physical activity       Fall Risk Fall Risk  12/27/2018 07/20/2017  Falls in the past year? 0 No  Number falls in past yr: 0 -  Injury with Fall? 0 -  Follow up Education provided;Falls prevention discussed -    Depression Screen PHQ 2/9 Scores 12/27/2018 10/26/2018 07/20/2017  PHQ - 2 Score 0 1 0  PHQ- 9 Score - 11 -     Cognitive Function Ad8 score reviewed for issues:  Issues making decisions:no  Less interest in hobbies / activities:no  Repeats questions, stories (family complaining):no   Trouble using ordinary gadgets (microwave, computer, phone):no  Forgets the month or year: no  Mismanaging finances: no  Remembering appts:no  Daily problems with thinking and/or memory:no Ad8 score is=0          There is no immunization history on file for this patient.  Screening Tests Health Maintenance  Topic Date Due  . Hepatitis C Screening  May 11, 1950  . TETANUS/TDAP  10/10/1969  . MAMMOGRAM  10/10/2000  . DEXA SCAN  10/11/2015  . PNA vac Low Risk Adult (1 of 2 - PCV13) 10/11/2015  . INFLUENZA VACCINE  09/09/2018  . COLONOSCOPY  02/25/2027      Plan:    Please schedule your next medicare wellness visit with me in 1 yr.  Continue to eat heart healthy diet (full of fruits, vegetables, whole grains, lean protein, water--limit salt, fat, and sugar intake) and increase physical activity as tolerated.  Continue doing brain stimulating activities (puzzles, reading, adult coloring books, staying active) to keep memory sharp.   Bring a copy of your living will and/or healthcare power of attorney to your next office visit.   I have personally reviewed and noted the following in the patient's chart:   . Medical and social history . Use of alcohol, tobacco or illicit drugs  . Current medications and supplements . Functional ability and status . Nutritional status . Physical activity . Advanced directives . List of other physicians . Hospitalizations, surgeries, and ER visits in previous 12 months . Vitals . Screenings to include cognitive, depression, and falls . Referrals and appointments  In addition, I have reviewed and discussed with patient certain preventive protocols, quality metrics, and best practice recommendations. A written personalized care plan for preventive services as well as general preventive health recommendations were provided to patient.     Avon GullyBritt, Babe Anthis Angel, [place]  12/27/2018

## 2018-12-27 ENCOUNTER — Ambulatory Visit (INDEPENDENT_AMBULATORY_CARE_PROVIDER_SITE_OTHER): Payer: Medicare Other | Admitting: *Deleted

## 2018-12-27 ENCOUNTER — Encounter: Payer: Self-pay | Admitting: *Deleted

## 2018-12-27 DIAGNOSIS — Z0000 Encounter for general adult medical examination without abnormal findings: Secondary | ICD-10-CM | POA: Diagnosis not present

## 2018-12-27 NOTE — Patient Instructions (Signed)
Please schedule your next medicare wellness visit with me in 1 yr.  Continue to eat heart healthy diet (full of fruits, vegetables, whole grains, lean protein, water--limit salt, fat, and sugar intake) and increase physical activity as tolerated.  Continue doing brain stimulating activities (puzzles, reading, adult coloring books, staying active) to keep memory sharp.   Bring a copy of your living will and/or healthcare power of attorney to your next office visit.   Mary Poole , Thank you for taking time to come for your Medicare Wellness Visit. I appreciate your ongoing commitment to your health goals. Please review the following plan we discussed and let me know if I can assist you in the future.   These are the goals we discussed: Goals    . Increase physical activity       This is a list of the screening recommended for you and due dates:  Health Maintenance  Topic Date Due  .  Hepatitis C: One time screening is recommended by Center for Disease Control  (CDC) for  adults born from [birth year] through 1965.   [birth year]  . Tetanus Vaccine  10/10/1969  . Mammogram  10/10/2000  . DEXA scan (bone density measurement)  10/11/2015  . Pneumonia vaccines (1 of 2 - PCV13) 10/11/2015  . Flu Shot  09/09/2018  . Colon Cancer Screening  02/25/2027    Preventive Care 68 Years and Older, Female Preventive care refers to lifestyle choices and visits with your health care provider that can promote health and wellness. This includes:  A yearly physical exam. This is also called an annual well check.  Regular dental and eye exams.  Immunizations.  Screening for certain conditions.  Healthy lifestyle choices, such as diet and exercise. What can I expect for my preventive care visit? Physical exam Your health care provider will check:  Height and weight. These may be used to calculate body mass index (BMI), which is a measurement that tells if you are at a healthy weight.  Heart rate and  blood pressure.  Your skin for abnormal spots. Counseling Your health care provider may ask you questions about:  Alcohol, tobacco, and drug use.  Emotional well-being.  Home and relationship well-being.  Sexual activity.  Eating habits.  History of falls.  Memory and ability to understand (cognition).  Work and work Statistician.  Pregnancy and menstrual history. What immunizations do I need?  Influenza (flu) vaccine  This is recommended every year. Tetanus, diphtheria, and pertussis (Tdap) vaccine  You may need a Td booster every 10 years. Varicella (chickenpox) vaccine  You may need this vaccine if you have not already been vaccinated. Zoster (shingles) vaccine  You may need this after age 68 Pneumococcal conjugate (PCV13) vaccine  One dose is recommended after age 68 Pneumococcal polysaccharide (PPSV23) vaccine  One dose is recommended after age 68 Measles, mumps, and rubella (MMR) vaccine  You may need at least one dose of MMR if you were born in 1957 or later. You may also need a second dose. Meningococcal conjugate (MenACWY) vaccine  You may need this if you have certain conditions. Hepatitis A vaccine  You may need this if you have certain conditions or if you travel or work in places where you may be exposed to hepatitis A. Hepatitis B vaccine  You may need this if you have certain conditions or if you travel or work in places where you may be exposed to hepatitis B. Haemophilus influenzae type b (Hib) vaccine  You  may need this if you have certain conditions. You may receive vaccines as individual doses or as more than one vaccine together in one shot (combination vaccines). Talk with your health care provider about the risks and benefits of combination vaccines. What tests do I need? Blood tests  Lipid and cholesterol levels. These may be checked every 5 years, or more frequently depending on your overall health.  Hepatitis C test.   Hepatitis B test. Screening  Lung cancer screening. You may have this screening every year starting at age 68 if you have a 30-pack-year history of smoking and currently smoke or have quit within the past 15 years.  Colorectal cancer screening. All adults should have this screening starting at age 68 and continuing until age 68. Your health care provider may recommend screening at age 94 if you are at increased risk. You will have tests every 1-10 years, depending on your results and the type of screening test.  Diabetes screening. This is done by checking your blood sugar (glucose) after you have not eaten for a while (fasting). You may have this done every 1-3 years.  Mammogram. This may be done every 1-2 years. Talk with your health care provider about how often you should have regular mammograms.  BRCA-related cancer screening. This may be done if you have a family history of breast, ovarian, tubal, or peritoneal cancers. Other tests  Sexually transmitted disease (STD) testing.  Bone density scan. This is done to screen for osteoporosis. You may have this done starting at age 68 Follow these instructions at home: Eating and drinking  Eat a diet that includes fresh fruits and vegetables, whole grains, lean protein, and low-fat dairy products. Limit your intake of foods with high amounts of sugar, saturated fats, and salt.  Take vitamin and mineral supplements as recommended by your health care provider.  Do not drink alcohol if your health care provider tells you not to drink.  If you drink alcohol: ? Limit how much you have to 0-1 drink a day. ? Be aware of how much alcohol is in your drink. In the [place] one drink equals one 12 oz bottle of beer (355 mL), one 5 oz glass of wine (148 mL), or one 1 oz glass of hard liquor (44 mL). Lifestyle  Take daily care of your teeth and gums.  Stay active. Exercise for at least 30 minutes on 5 or more days each week.  Do not use any  products that contain nicotine or tobacco, such as cigarettes, e-cigarettes, and chewing tobacco. If you need help quitting, ask your health care provider.  If you are sexually active, practice safe sex. Use a condom or other form of protection in order to prevent STIs (sexually transmitted infections).  Talk with your health care provider about taking a low-dose aspirin or statin. What's next?  Go to your health care provider once a year for a well check visit.  Ask your health care provider how often you should have your eyes and teeth checked.  Stay up to date on all vaccines. This information is not intended to replace advice given to you by your health care provider. Make sure you discuss any questions you have with your health care provider. Document Released: 02/21/2015 Document Revised: 01/19/2018 Document Reviewed: 01/19/2018 Elsevier Patient Education  2020 [employer].

## 2019-01-12 ENCOUNTER — Telehealth: Payer: Self-pay | Admitting: *Deleted

## 2019-01-12 ENCOUNTER — Telehealth (INDEPENDENT_AMBULATORY_CARE_PROVIDER_SITE_OTHER): Payer: Medicare Other | Admitting: Cardiology

## 2019-01-12 ENCOUNTER — Other Ambulatory Visit: Payer: Self-pay

## 2019-01-12 ENCOUNTER — Encounter: Payer: Self-pay | Admitting: Cardiology

## 2019-01-12 VITALS — BP 129/78 | HR 73 | Ht 65.0 in | Wt 213.0 lb

## 2019-01-12 DIAGNOSIS — G4733 Obstructive sleep apnea (adult) (pediatric): Secondary | ICD-10-CM | POA: Diagnosis not present

## 2019-01-12 DIAGNOSIS — I10 Essential (primary) hypertension: Secondary | ICD-10-CM | POA: Diagnosis not present

## 2019-01-12 NOTE — Progress Notes (Signed)
Virtual Visit via Video Note   This visit type was conducted due to national recommendations for restrictions regarding the COVID-19 Pandemic (e.g. social distancing) in an effort to limit this patient's exposure and mitigate transmission in our community.  Due to her co-morbid illnesses, this patient is at least at moderate risk for complications without adequate follow up.  This format is felt to be most appropriate for this patient at this time.  All issues noted in this document were discussed and addressed.  A limited physical exam was performed with this format.  Please refer to the patient's chart for her consent to telehealth for [facility].  Evaluation Performed:  Follow-up visit  This visit type was conducted due to national recommendations for restrictions regarding the COVID-19 Pandemic (e.g. social distancing).  This format is felt to be most appropriate for this patient at this time.  All issues noted in this document were discussed and addressed.  No physical exam was performed (except for noted visual exam findings with Video Visits).  Please refer to the patient's chart (MyChart message for video visits and phone note for telephone visits) for the patient's consent to telehealth for [facility]  Date:  01/12/2019   ID:  Mary Poole, DOB 02-01-1951, MRN [id number]  Patient Location:  Home  Provider location:   [place]  PCP:  Luetta Nutting, DO  Sleep medicine:  Fransico Him, MD  Electrophysiologist:  Will Meredith Leeds, MD   Chief Complaint:  OSA  History of Present Illness:    Mary Poole is a 68 y.o. female who presents via audio/video conferencing for a telehealth visit today.    Mary Poole is a 68 y.o. female with a hx of atrial fibrillation, hypertension and GERD.  She was referred by Dr. Tamala Julian for sleep study.  She underwent home sleep study a year ago which showed an AHI of 15.3 with lowest oxygen and desaturation at 80%.  She underwent CPAP  titration in May 2019 and was titrated to 10 cm H2O.   She is doing well with her CPAP device and thinks that she has gotten used to it.  She has had problems with her nasal pillow mask.  The one she had prior worked great with no leakage and she just received a new mask that is different and she called her DME and they would no change it out.  This mask does not fit correctly and it is leaking and blowing into her eyes and causing blistering of her skin.  She feels the pressure is adequate.  Since going on CPAP she feels rested in the am and has no significant daytime sleepiness.  She denies any significant mouth or nasal dryness or nasal congestion.  She does not think that he snores.    The patient does not have symptoms concerning for COVID-19 infection (fever, chills, cough, or new shortness of breath).   Prior CV studies:   The following studies were reviewed today:  PAP compliance download  Past Medical History:  Diagnosis Date  . Anxiety   . Atrial fibrillation ([place])   . Complication of anesthesia    "woke up during colonoscopy 02/2017" (11/09/2017)  . GERD (gastroesophageal reflux disease)   . H/O atrial flutter   . History of blood transfusion 02/2017   LGIB (11/09/2017)  . Hypertension   . Migraine    "a few/year" (11/09/2017)  . OSA on CPAP    Past Surgical History:  Procedure Laterality Date  .  ATRIAL FIBRILLATION ABLATION N/A 11/09/2017   Procedure: ATRIAL FIBRILLATION ABLATION;  Surgeon: Regan Lemming, MD;  Location: [facility] INVASIVE CV LAB;  Service: Cardiovascular;  Laterality: N/A;  . COLONOSCOPY N/A 02/24/2017   Procedure: COLONOSCOPY;  Surgeon: Jeani Hawking, MD;  Location: [facility] ENDOSCOPY;  Service: Gastroenterology;  Laterality: N/A;  . ENTEROSCOPY N/A 02/24/2017   Procedure: ENTEROSCOPY;  Surgeon: Jeani Hawking, MD;  Location: [facility] ENDOSCOPY;  Service: Gastroenterology;  Laterality: N/A;  . HERNIA REPAIR  1960s  . WISDOM TOOTH EXTRACTION       Current Meds   Medication Sig  . apixaban (ELIQUIS) 5 MG TABS tablet Take 1 tablet (5 mg total) by mouth 2 (two) times daily.  Marland Kitchen diltiazem (CARDIZEM CD) 240 MG 24 hr capsule Take 1 capsule (240 mg total) by mouth daily.  Marland Kitchen escitalopram (LEXAPRO) 10 MG tablet Take 10 mg by mouth daily.  . furosemide (LASIX) 20 MG tablet Take 1 tablet by mouth daily for the next 3 days then as needed for swelling  . LORazepam (ATIVAN) 0.5 MG tablet Take 0.5-1 tablets (0.25-0.5 mg total) by mouth every 8 (eight) hours as needed for anxiety (or panic).  Marland Kitchen losartan (COZAAR) 50 MG tablet Take 1 tablet (50 mg total) by mouth daily.  . metoprolol succinate (TOPROL-XL) 50 MG 24 hr tablet Take 1 tablet (50 mg total) by mouth daily. Take with or immediately following a meal.  . Multiple Vitamin (MULTIVITAMIN) LIQD Take 30 mLs by mouth daily.  Marland Kitchen omeprazole (PRILOSEC) 40 MG capsule TAKE 1 CAPSULE BY MOUTH EVERY DAY  . PRESCRIPTION MEDICATION Inhale into the lungs at bedtime. CPAP     Allergies:   Diphenhydramine hcl and Flecainide   Social History   Tobacco Use  . Smoking status: Former Smoker    Packs/day: 0.25    Years: 10.00    Pack years: 2.50    Types: Cigarettes  . Smokeless tobacco: Never Used  . Tobacco comment: 11/09/2017 "quit smoking in the 1980s"  Substance Use Topics  . Alcohol use: Never    Frequency: Never  . Drug use: Never     Family Hx: The patient's family history includes AAA (abdominal aortic aneurysm) in her sister; Allergies in an other family member; CAD in her father; Clotting disorder in an other family member; Heart disease in an other family member; Hypertension in an other family member; Migraines in an other family member; Rheum arthritis in an other family member; Thyroid disease in her sister.  ROS:   Please see the history of present illness.     All other systems reviewed and are negative.   Labs/Other Tests and Data Reviewed:    Recent Labs: 09/12/2018: TSH 0.37 10/11/2018: BUN 16;  Creatinine, Ser 1.00; Hemoglobin 12.2; Platelets 263; Potassium 4.4; Sodium 142   Recent Lipid Panel No results found for: CHOL, TRIG, HDL, CHOLHDL, LDLCALC, LDLDIRECT  Wt Readings from Last 3 Encounters:  01/12/19 213 lb (96.6 kg)  12/12/18 210 lb 9.6 oz (95.5 kg)  12/06/18 209 lb (94.8 kg)     Objective:    Vital Signs:  BP 129/78   Pulse 73   Ht 5\' 5"  (1.651 m)   Wt 213 lb (96.6 kg)   BMI 35.45 kg/m    CONSTITUTIONAL:  Well nourished, well developed female in no acute distress.  EYES: anicteric MOUTH: oral mucosa is pink RESPIRATORY: Normal respiratory effort, symmetric expansion CARDIOVASCULAR: No peripheral edema SKIN: No rash, lesions or ulcers MUSCULOSKELETAL: no digital cyanosis NEURO: Cranial  Nerves II-XII grossly intact, moves all extremities PSYCH: Intact judgement and insight.  A&O x 3, Mood/affect appropriate   ASSESSMENT & PLAN:    1.  OSA - The patient is tolerating PAP therapy well without any problems. The PAP download was reviewed today and showed an AHI of 2.2/hr on auto PCP with 100% compliance in using more than 4 hours nightly.  The patient has been using and benefiting from PAP use and will continue to benefit from therapy. I will contact her DME to change out her mask to the same brand and type she had prior as this new mask is not fitting and is not the same brand or size and she is having problems with leaking and blowing air into her eyes.  She will call me if ;this is not resolved.   2.  HTN -BP controlled -continue Cardizem CD 240mg  daily and Toprol XL 50mg  daily  3.  Morbid obesity -she has obesity with comorbid cardiac problems including obesity -I have encouraged her to get into a routine exercise program and cut back on carbs and portions.   COVID-19 Education: The signs and symptoms of COVID-19 were discussed with the patient and how to seek care for testing (follow up with PCP or arrange E-visit).  The importance of social distancing was  discussed today.  Patient Risk:   After full review of this patient's clinical status, I feel that they are at least moderate risk at this time.  Time:   Today, I have spent 20 minutes directly with the patient on telemedicine discussing medical problems including OSA, HTN, Obesity.  We also reviewed the symptoms of COVID 19 and the ways to protect against contracting the virus with telehealth technology.  I spent an additional 5 minutes reviewing patient's chart including PAP compliance download.  Medication Adjustments/Labs and Tests Ordered: Current medicines are reviewed at length with the patient today.  Concerns regarding medicines are outlined above.  Tests Ordered: No orders of the defined types were placed in this encounter.  Medication Changes: No orders of the defined types were placed in this encounter.   Disposition:  Follow up in 1 year(s)  Signed, Armanda Magicraci Careen Mauch, MD  01/12/2019 9:49 AM    [facility]

## 2019-01-12 NOTE — Patient Instructions (Signed)
Medication Instructions:  Your physician recommends that you continue on your current medications as directed. Please refer to the Current Medication list given to you today.  *If you need a refill on your cardiac medications before your next appointment, please call your pharmacy*  Follow-Up: At [facility], you and your health needs are our priority.  As part of our continuing mission to provide you with exceptional heart care, we have created designated Provider Care Teams.  These Care Teams include your primary Cardiologist (physician) and Advanced Practice Providers (APPs -  Physician Assistants and Nurse Practitioners) who all work together to provide you with the care you need, when you need it.  Your next appointment:   1 year(s)  The format for your next appointment:   Either In Person or Virtual  Provider:   Fransico Him, MD  Other Instructions Dr. Theodosia Blender assistant, Gae Bon, will be in contact with you regarding CPAP equipment.

## 2019-01-12 NOTE — Telephone Encounter (Signed)
-----   Message from Sueanne Margarita, MD sent at 01/12/2019  9:56 AM EST ----- Please call [facility] and tell them patient needs a NEW MASK.  She uses a nasal pillow mask and got a new mask which is completely different and she called them to tell them that the mask does not fit and is blowing air into her eyes and her stomach is bloated.  She tried to exchange it out and they refused.  Please call them and tell them they need to change out her mask for the old type she had - it needs to be the same brand, size and type. Followup with 1 year

## 2019-01-12 NOTE — Telephone Encounter (Signed)
Reached out to [place] spoke to [place] and she states she will speak to the respiratory therapist to call the patient and schedule a mask fit.

## 2019-01-15 NOTE — Telephone Encounter (Signed)
Reached out to the patient to follow up on her mask fitting with [facility] and she states she is waiting for [place] [employer]) to return her call and she will contact out office if her issue with [facility] is not resolved.

## 2019-01-18 NOTE — Telephone Encounter (Signed)
Great news.

## 2019-01-18 NOTE — Telephone Encounter (Signed)
Patient called back to say she met with the RT at [facility] and got the mask she needed and she has been having the best sleep ever.  Pt is agreeable to treatment.

## 2019-01-31 ENCOUNTER — Other Ambulatory Visit: Payer: Self-pay | Admitting: Family Medicine

## 2019-02-18 ENCOUNTER — Other Ambulatory Visit: Payer: Self-pay | Admitting: Family Medicine

## 2019-02-19 ENCOUNTER — Other Ambulatory Visit (HOSPITAL_COMMUNITY): Payer: Self-pay | Admitting: Nurse Practitioner

## 2019-03-20 ENCOUNTER — Telehealth: Payer: Self-pay | Admitting: Cardiology

## 2019-03-20 NOTE — Telephone Encounter (Signed)
New Message  We are recommending the COVID-19 vaccine to all of our patients. Cardiac medications (including blood thinners) should not deter anyone from being vaccinated and there is no need to hold any of those medications prior to vaccine administration.     Currently, there is a hotline to call (active 02/16/19) to schedule vaccination appointments as no walk-ins will be accepted.   Number: [phone].    If an appointment is not available please go to [email or web address] to sign up for notification when additional vaccine appointments are available.   If you have further questions or concerns about the vaccine process, please visit [email or web address] or contact your primary care physician.   

## 2019-03-27 ENCOUNTER — Other Ambulatory Visit: Payer: Self-pay | Admitting: Cardiology

## 2019-03-27 NOTE — Telephone Encounter (Signed)
Prescription refill request for Eliquis received.  Last office visit: 01/12/2019, [place] Scr: 1.0, 10/11/2018 Age:  69 y.o. Weight: 95.5 kg   Prescription refill sent

## 2019-04-15 ENCOUNTER — Other Ambulatory Visit: Payer: Self-pay | Admitting: Cardiology

## 2019-04-25 ENCOUNTER — Other Ambulatory Visit: Payer: Self-pay | Admitting: Cardiology

## 2019-05-02 DIAGNOSIS — G4733 Obstructive sleep apnea (adult) (pediatric): Secondary | ICD-10-CM | POA: Diagnosis not present

## 2019-05-02 DIAGNOSIS — I10 Essential (primary) hypertension: Secondary | ICD-10-CM | POA: Diagnosis not present

## 2019-05-17 ENCOUNTER — Telehealth: Payer: Self-pay | Admitting: Cardiology

## 2019-05-17 DIAGNOSIS — G4733 Obstructive sleep apnea (adult) (pediatric): Secondary | ICD-10-CM

## 2019-05-17 NOTE — Telephone Encounter (Signed)
Please find out what type of mask she is using and if mask is leaking

## 2019-05-17 NOTE — Telephone Encounter (Signed)
New Message  Patient is calling in to see if the air pressure can be turned down on her cap machine. Patient reports dry mouth and bloating. Please give patient a call to assist.

## 2019-05-18 NOTE — Telephone Encounter (Signed)
Please order chin strap and have patient call in 1-2 weeks to let us know if her mouth dryness is helping

## 2019-05-18 NOTE — Telephone Encounter (Signed)
Patient states she is using the nasal pillows which works great for her. She has not noticed them leaking. She got new supplies in and she is going to switch out her nasal pillows and call back in a couple of weeks.

## 2019-05-22 NOTE — Addendum Note (Signed)
Addended by: Reesa Chew on: 05/22/2019 02:23 PM   Modules accepted: Orders

## 2019-05-22 NOTE — Telephone Encounter (Signed)
Chin strap ordered sent to [facility].

## 2019-05-29 NOTE — Telephone Encounter (Signed)
How does she know the chin strap is not working.  I suspect there is a leak somewhere whether it is in the mask or through her mask and the machine senses it because she is on auto and then the device turns the pressure up higher.  Lets change her to a set pressure of 11cm H2O and get a download in 4 weeks

## 2019-05-29 NOTE — Telephone Encounter (Signed)
Patient states the chin strap does not help and she is getting stomach bloating from too much air pressure blowing in.   Signer, ALONIA 04/29/2019 - 05/28/2019 DOB: 03-11-1950 Age: 69 years [facility] [place] Compliance Report Usage 04/29/2019 - 05/28/2019 Usage days 30/30 days (100%) >= 4 hours 30 days (100%) < 4 hours 0 days (0%) Usage hours 208 hours 34 minutes Average usage (total days) 6 hours 57 minutes Average usage (days used) 6 hours 57 minutes Median usage (days used) 6 hours 53 minutes Total used hours (value since last reset - 05/28/2019) 3,980 hours AirSense 10 AutoSet Serial number [id number] Mode AutoSet Min Pressure 4 cmH2O Max Pressure 14 cmH2O EPR Fulltime EPR level 2 Response Standard Therapy Pressure - cmH2O Median: 8.3 95th percentile: 11.1 Maximum: 12.3 Leaks - L/min Median: 9.8 95th percentile: 19.6 Maximum: 36.7 Events per hour AI: 0.6 HI: 1.7 AHI: 2.3 Apnea Index Central: 0.1 Obstructive: 0.5 Unknown: 0.0 RERA Index 0.8 Cheyne-Stokes respiration (average duration per night) 3 minutes (1%) Usage - hours Printed on 05/29/2019 - [employer]Med AirView version 4.23.0-2.0 Page 1 of 1

## 2019-06-01 NOTE — Addendum Note (Signed)
Addended by: Reesa Chew on: 06/01/2019 03:15 PM   Modules accepted: Orders

## 2019-06-01 NOTE — Telephone Encounter (Signed)
Order placed to [facility] via community message.

## 2019-06-14 ENCOUNTER — Other Ambulatory Visit: Payer: Self-pay | Admitting: Family Medicine

## 2019-06-16 IMAGING — DX DG CHEST 1V PORT
1 series · 1 of 1 positions shown · non-contrast
Comparison: 10/14/2017

CLINICAL DATA: Palpitations

EXAM:
PORTABLE CHEST 1 VIEW

[chest ap]
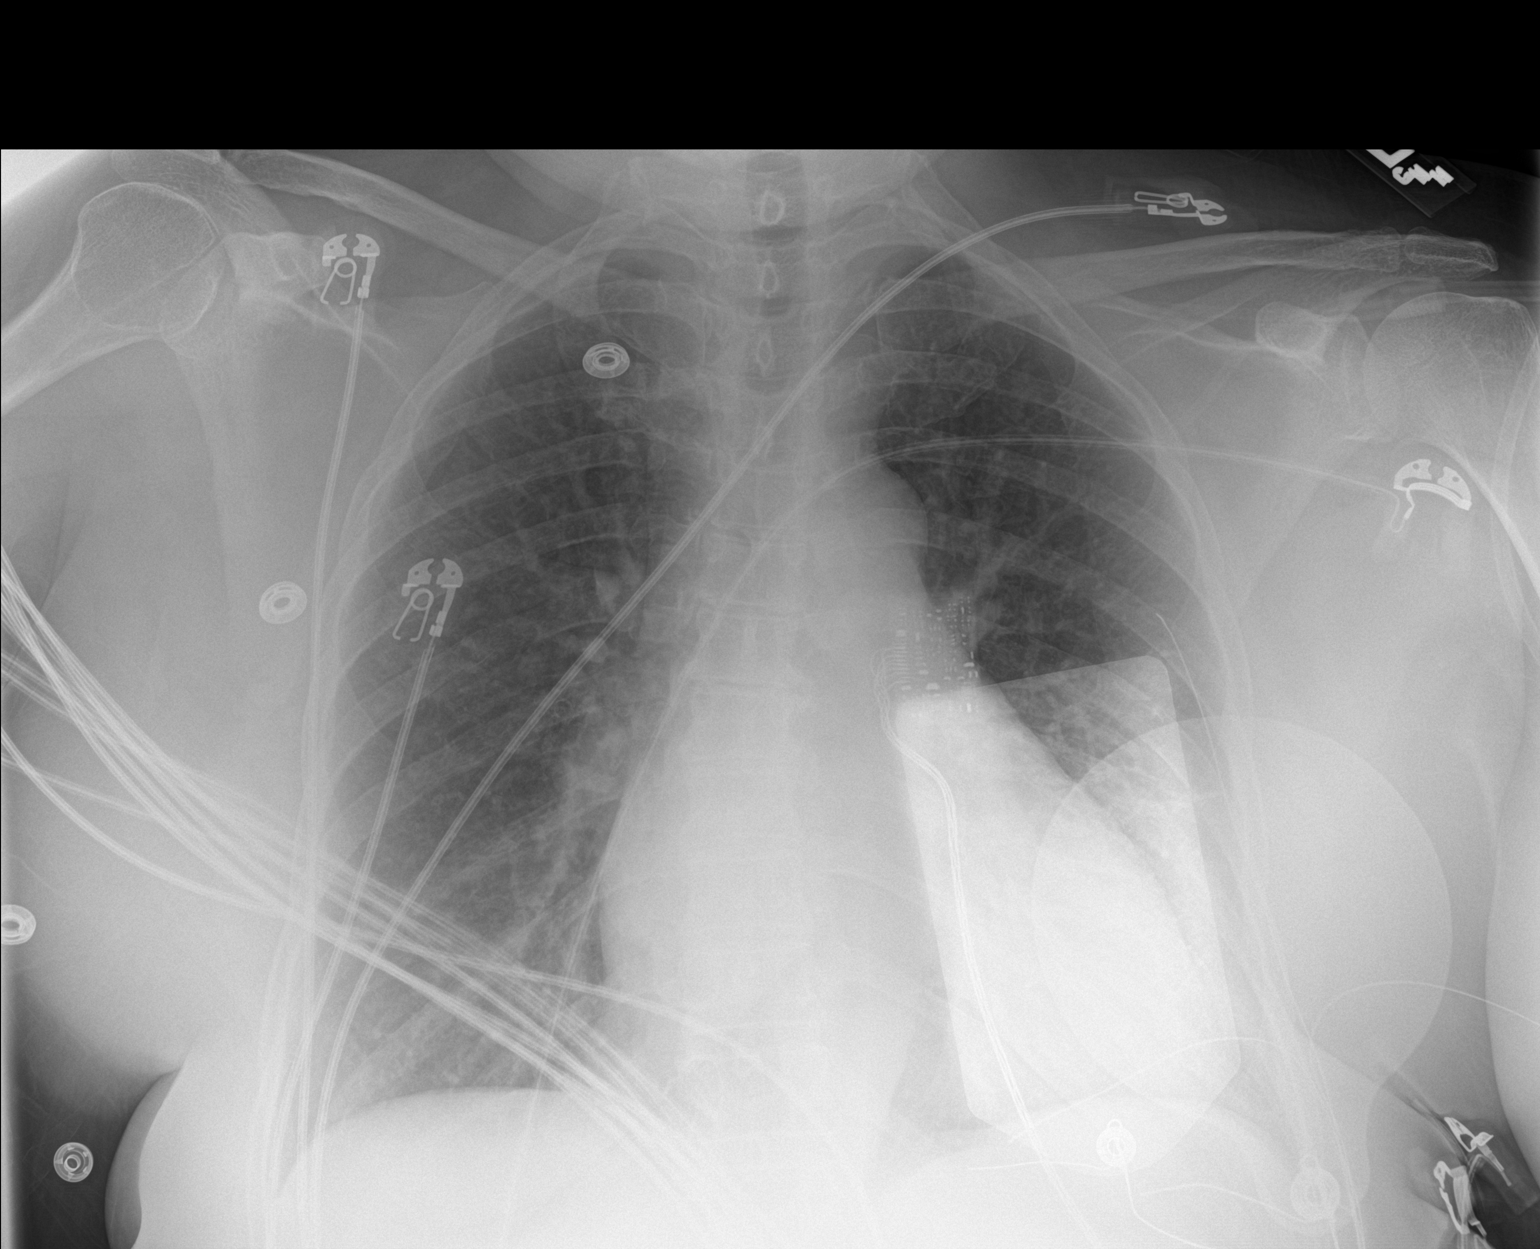

[1 of 1 positions shown; findings below may reference images not displayed]

FINDINGS: Mild cardiomegaly. No confluent opacities, effusions or edema. No
acute bony abnormality.
IMPRESSION: Cardiomegaly.  No active disease.

## 2019-06-18 IMAGING — DX DG CHEST 1V PORT
1 series · 1 of 1 positions shown · non-contrast
Comparison: 10/30/2017

CLINICAL DATA: Shortness of Breath

EXAM:
PORTABLE CHEST 1 VIEW

[chest]
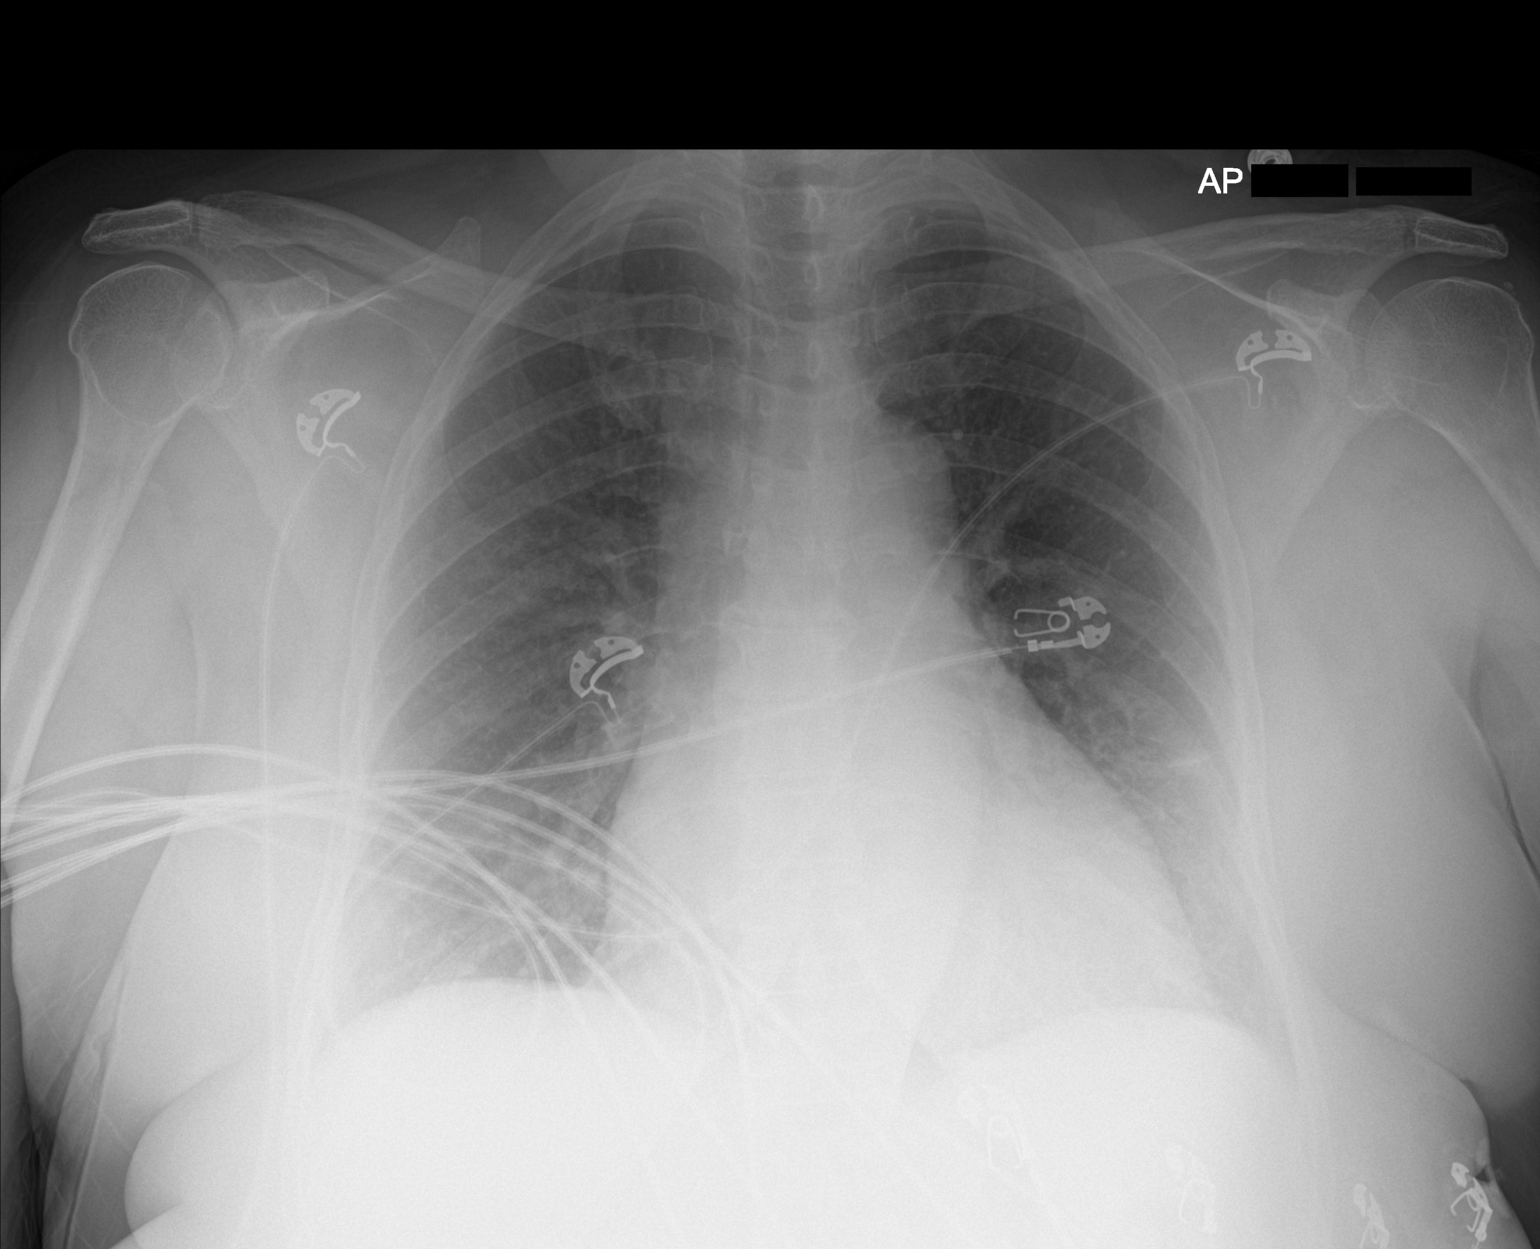

[1 of 1 positions shown; findings below may reference images not displayed]

FINDINGS: Borderline heart size. Lingular scarring or atelectasis. Right lung
clear. No effusions or edema. No acute bony abnormality.
IMPRESSION: Borderline heart size.  Lingular atelectasis or scarring.

## 2019-06-19 NOTE — Telephone Encounter (Signed)
Order placed to [facility] via community message 2nd time.

## 2019-06-25 DIAGNOSIS — G4733 Obstructive sleep apnea (adult) (pediatric): Secondary | ICD-10-CM | POA: Diagnosis not present

## 2019-07-01 ENCOUNTER — Other Ambulatory Visit: Payer: Self-pay | Admitting: Family Medicine

## 2019-07-05 ENCOUNTER — Other Ambulatory Visit: Payer: Self-pay

## 2019-07-05 MED ORDER — FUROSEMIDE 20 MG PO TABS: ORAL_TABLET | ORAL | 3 refills | Status: DC

## 2019-07-05 NOTE — Telephone Encounter (Signed)
Pt's medication was sent to pt's pharmacy as requested. Confirmation received.  °

## 2019-07-10 ENCOUNTER — Ambulatory Visit: Payer: Medicare Other | Admitting: Cardiology

## 2019-07-12 ENCOUNTER — Other Ambulatory Visit: Payer: Self-pay | Admitting: Cardiology

## 2019-07-19 ENCOUNTER — Other Ambulatory Visit: Payer: Self-pay | Admitting: Family Medicine

## 2019-07-19 NOTE — Telephone Encounter (Signed)
Last OV 12/06/18 Last fill 06/14/19  #30/0

## 2019-08-10 ENCOUNTER — Other Ambulatory Visit: Payer: Self-pay | Admitting: Family Medicine

## 2019-08-10 NOTE — Telephone Encounter (Signed)
Last VV 12/06/18 Last fill 07/19/19  #30/0 Pt to establish with a PCP before any other refills.

## 2019-09-06 ENCOUNTER — Ambulatory Visit: Payer: Medicare Other | Admitting: Cardiology

## 2019-09-14 ENCOUNTER — Other Ambulatory Visit: Payer: Self-pay | Admitting: Family Medicine

## 2019-09-14 NOTE — Telephone Encounter (Signed)
Last seen 11/2018 Last filled 08/10/2019 #30 with no refills Okay to refill?

## 2019-09-14 NOTE — Telephone Encounter (Signed)
Rx refilled x 90 days. Pt needs TOC appt with provider in our office prior to any additional med refills

## 2019-09-14 NOTE — Telephone Encounter (Signed)
Patient notified and verbalized understanding stating she will make an appointment at a later date.

## 2019-09-18 ENCOUNTER — Telehealth (HOSPITAL_COMMUNITY): Payer: Self-pay

## 2019-09-18 NOTE — Telephone Encounter (Signed)
Pt called in to report that BP was 136/102 and HR was bouncing between 140-160 bpm. Per Rudi Coco, NP pt can take an extra 0.5 tablet of Metoprolol at night tonight and tomorrow night and call the office back on Thursday to report if she converts or not. If she cannot tolerate then she can be prescribed Cardizem 30 mg to take every 4 hours for breakthrough. Pt is aware and agreeable.

## 2019-09-23 NOTE — Progress Notes (Signed)
Cardiology Office Note Date:  09/24/2019  Patient ID:  Mary Poole [birth year], MRN [id number] PCP:  Everrett Coombe, DO  OSA: Dr. Mayford Knife EP: Dr. [name]    Chief Complaint: planned f/u (overdue)  History of Present Illness: Mary Poole is a 69 y.o. female with history of GERD, HTN, OSA, migraines, anxiety, AFib, Aflutter, morbid obesity  She comes in today to be seen for Dr. [name] Nov 2020.  At that time, doing well on BB/CCB and OAC. Noted short PR, no accessory pathway was found during her EPS. No changes were made.  Most recentlly she had a tele health visit with Dr. Mayford Knife Dec 2020, tolerating her CPAP, noted compliance, planned to try a different mask style.  She has done well with out symptoms of AFib until the last couple weeks. She had a day a week or so ago that she had recurrent but brief fluttering and awareness of her heart beat. She reached out to the Afib clinic though by the time she went to take the extra 1/2 tab of her metoprolol seemed to settle down and did not take it. A few days ago similarly had an awareness of some intermittently fluttering like she was going to have AF. She noticed the 1st time was after eating chicken with a sauce on it, and the other after popcorn. She had chinese food recently that historically has never bothered her and seemed to make her feekl aware of her heart beat as well. She mentions can hear her heart beat in her ears sometimes.  No dizzy spells, no near syncope or syncope No bleeding or signs of bleeding.  She used to walk 3-4times a week up to , but when COVID shut everything down she stopped and has gained several pounds. She and her husband only recently started back with some walking but not close to where they were a year and a half ago.   Afib Hx Diagnosed 2018 Has been seen to have a WCT that was felt when slowed to have been an Aflutter 11/09/2017: PVI and CTI ablation   AAD None to date   Past  Medical History:  Diagnosis Date  . Anxiety   . Atrial fibrillation (HCC)   . Complication of anesthesia    "woke up during colonoscopy 02/2017" (11/09/2017)  . GERD (gastroesophageal reflux disease)   . H/O atrial flutter   . History of blood transfusion 02/2017   LGIB (11/09/2017)  . Hypertension   . Migraine    "a few/year" (11/09/2017)  . OSA on CPAP     Past Surgical History:  Procedure Laterality Date  . ATRIAL FIBRILLATION ABLATION N/A 11/09/2017   Procedure: ATRIAL FIBRILLATION ABLATION;  Surgeon: Regan Lemming, MD;  [facility];  Service: Cardiovascular;  Laterality: N/A;  . COLONOSCOPY N/A 02/24/2017   Procedure: COLONOSCOPY;  Surgeon: Jeani Hawking, MD;  Location: [facility] ENDOSCOPY;  Service: Gastroenterology;  Laterality: N/A;  . ENTEROSCOPY N/A 02/24/2017   Procedure: ENTEROSCOPY;  Surgeon: Jeani Hawking, MD;  Location: [facility] ENDOSCOPY;  Service: Gastroenterology;  Laterality: N/A;  . HERNIA REPAIR  1960s  . WISDOM TOOTH EXTRACTION      Current Outpatient Medications  Medication Sig Dispense Refill  . diltiazem (CARDIZEM CD) 240 MG 24 hr capsule TAKE 1 CAPSULE BY MOUTH EVERY DAY 90 capsule 2  . ELIQUIS 5 MG TABS tablet TAKE 1 TABLET BY MOUTH TWICE A DAY 60 tablet 5  . escitalopram (LEXAPRO) 10 MG tablet Take  1qd (Must sched appt with new provider for future fills) 30 tablet 0  . furosemide (LASIX) 20 MG tablet Take 1 tablet by mouth daily as needed for swelling 30 tablet 3  . LORazepam (ATIVAN) 0.5 MG tablet Take 0.5-1 tablets (0.25-0.5 mg total) by mouth every 8 (eight) hours as needed for anxiety (or panic). 30 tablet 0  . losartan (COZAAR) 50 MG tablet TAKE 1 TABLET BY MOUTH EVERY DAY 30 tablet 6  . metoprolol succinate (TOPROL-XL) 50 MG 24 hr tablet TAKE 1 TABLET (50 MG TOTAL) BY MOUTH DAILY. TAKE WITH OR IMMEDIATELY FOLLOWING A MEAL. 90 tablet 2  . Multiple Vitamin (MULTIVITAMIN) LIQD Take 30 mLs by mouth daily.    Marland Kitchen omeprazole (PRILOSEC) 40 MG capsule  TAKE 1 CAPSULE BY MOUTH EVERY DAY 90 capsule 0  . PRESCRIPTION MEDICATION Inhale into the lungs at bedtime. CPAP     No current facility-administered medications for this visit.    Allergies:   Diphenhydramine hcl and Flecainide   Social History:  The patient  reports that she has quit smoking. Her smoking use included cigarettes. She has a 2.50 pack-year smoking history. She has never used smokeless tobacco. She reports that she does not drink alcohol and does not use drugs.   Family History:  The patient's family history includes AAA (abdominal aortic aneurysm) in her sister; Allergies in an other family member; CAD in her father; Clotting disorder in an other family member; Heart disease in an other family member; Hypertension in an other family member; Migraines in an other family member; Rheum arthritis in an other family member; Thyroid disease in her sister.  ROS:  Please see the history of present illness. All other systems are reviewed and otherwise negative.   PHYSICAL EXAM:  VS:  BP (!) 144/78   Pulse (!) 58   Ht 5\' 5"  (1.651 m)   Wt 226 lb (102.5 kg)   BMI 37.61 kg/m  BMI: Body mass index is 37.61 kg/m. Well nourished, well developed, in no acute distress  HEENT: normocephalic, atraumatic  Neck: no JVD, carotid bruits or masses Cardiac:  RRR; no significant murmurs, no rubs, or gallops Lungs:  CTA b/l, no wheezing, rhonchi or rales  Abd: soft, nontender, obese MS: no deformity or atrophy Ext: no edema  Skin: warm and dry, no rash Neuro:  No gross deficits appreciated Psych: euthymic mood, full affect   EKG:  Done today and reviewed by myself:  SB 58bpm, short PR   11/09/2017: EPS/ablation CONCLUSIONS: 1. Sinus rhythm upon presentation.   2. Successful electrical isolation and anatomical encircling of all four pulmonary veins with radiofrequency current.    3. Cavo-tricuspid isthmus ablation was performed with complete bidirectional isthmus block achieved.  4. No  inducible arrhythmias following ablation both on and off of dobutamine 5. No early apparent complications.    09/18/2017: TTE Study Conclusions  - Left ventricle: The cavity size was normal. Wall thickness was  increased in a pattern of mild LVH. Systolic function was normal.  The estimated ejection fraction was in the range of 55% to 60%.  Wall motion was normal; there were no regional wall motion  abnormalities. Doppler parameters are consistent with restrictive  physiology, indicative of decreased left ventricular diastolic  compliance and/or increased left atrial pressure.  - Mitral valve: Calcified annulus. There was moderate  regurgitation.  - Left atrium: The atrium was severely dilated.  - Atrial septum: The septum bowed from left to right, consistent  with increased  left atrial pressure.  - Pulmonary arteries: Systolic pressure was moderately increased.  PA peak pressure: 52 mm Hg (S).   Impressions:  - Normal LV function; mild LVH; restrictive filling; moderate MR;  severe LAE; mild TR with moderate pulmonary hypertension.    10/06/2016: stress myoview Narrative & Impression  Nuclear stress EF: 58%. The left ventricular ejection fraction is normal (55-65%).  The study is normal. There is breast attenuation. No ischemia . no evidence of infarction  This is a low risk study.   Recent Labs: 10/11/2018: BUN 16; Creatinine, Ser 1.00; Hemoglobin 12.2; Platelets 263; Potassium 4.4; Sodium 142  No results found for requested labs within last 8760 hours.   CrCl cannot be calculated (Patient's most recent lab result is older than the maximum 21 days allowed.).   Wt Readings from Last 3 Encounters:  09/24/19 226 lb (102.5 kg)  01/12/19 213 lb (96.6 kg)  12/12/18 210 lb 9.6 oz (95.5 kg)     Other studies reviewed: Additional studies/records reviewed today include: summarized above  ASSESSMENT AND PLAN:  1. Paroxysmal AFib and flutter     S/p  PVI/CTI ablation     CHA2DS2Vasc is 3, on Eliquis, appropriately dosed      I suspect perhaps her BP gets up when she is having symptoms associated after eating salty foods.  2. HTN     Not completely controlled, and as above suspect gets up with salt loads      Discussed walking for exercise, weight loss, and reducing sodium in her diet BMET, TSH, and CBC today   Disposition: F/u with me in 21mo, sooner if needed  Current medicines are reviewed at length with the patient today.  The patient did not have any concerns regarding medicines.  Norma Fredrickson, PA-C 09/24/2019 3:14 PM     [facility] [address] [place] [address] [phone] (office)  [phone] (fax)

## 2019-09-24 ENCOUNTER — Ambulatory Visit: Payer: Medicare Other | Admitting: Physician Assistant

## 2019-09-24 ENCOUNTER — Other Ambulatory Visit: Payer: Self-pay

## 2019-09-24 VITALS — BP 144/78 | HR 58 | Ht 65.0 in | Wt 226.0 lb

## 2019-09-24 DIAGNOSIS — I10 Essential (primary) hypertension: Secondary | ICD-10-CM

## 2019-09-24 DIAGNOSIS — I480 Paroxysmal atrial fibrillation: Secondary | ICD-10-CM

## 2019-09-24 NOTE — Patient Instructions (Addendum)
Medication Instructions:  Your physician recommends that you continue on your current medications as directed. Please refer to the Current Medication list given to you today.  *If you need a refill on your cardiac medications before your next appointment, please call your pharmacy*   Lab Work: BMET CBC AND TSH   If you have labs (blood work) drawn today and your tests are completely normal, you will receive your results only by: Marland Kitchen MyChart Message (if you have MyChart) OR . A paper copy in the mail If you have any lab test that is abnormal or we need to change your treatment, we will call you to review the results.   Testing/Procedures: NONE ORDERED  TODAY     Follow-Up: At [facility], you and your health needs are our priority.  As part of our continuing mission to provide you with exceptional heart care, we have created designated Provider Care Teams.  These Care Teams include your primary Cardiologist (physician) and Advanced Practice Providers (APPs -  Physician Assistants and Nurse Practitioners) who all work together to provide you with the care you need, when you need it.  We recommend signing up for the patient portal called "MyChart".  Sign up information is provided on this After Visit Summary.  MyChart is used to connect with patients for Virtual Visits (Telemedicine).  Patients are able to view lab/test results, encounter notes, upcoming appointments, etc.  Non-urgent messages can be sent to your provider as well.   To learn more about what you can do with MyChart, go to [email or web address].    Your next appointment:   6 month(s)  The format for your next appointment:   In Person  Provider:   You may see Will Jorja Loa, MD or one of the following Advanced Practice Providers on your designated Care Team:    Gypsy Balsam, NP  Francis Dowse, PA-C  Casimiro Needle "Otilio Saber, [place]    Other Instructions

## 2019-09-25 LAB — BASIC METABOLIC PANEL
BUN/Creatinine Ratio: 20 (ref 12–28)
BUN: 18 mg/dL (ref 8–27)
CO2: 25 mmol/L (ref 20–29)
Calcium: 9.5 mg/dL (ref 8.7–10.3)
Chloride: 104 mmol/L (ref 96–106)
Creatinine, Ser: 0.91 mg/dL (ref 0.57–1.00)
GFR calc Af Amer: 75 mL/min/{1.73_m2} (ref >59)
GFR calc non Af Amer: 65 mL/min/{1.73_m2} (ref >59)
Glucose: 97 mg/dL (ref 65–99)
Potassium: 4.3 mmol/L (ref 3.5–5.2)
Sodium: 140 mmol/L (ref 134–144)

## 2019-09-25 LAB — CBC
Hematocrit: 39 % (ref 34.0–46.6)
Hemoglobin: 12.2 g/dL (ref 11.1–15.9)
MCH: 26.7 pg (ref 26.6–33.0)
MCHC: 31.3 g/dL — ABNORMAL LOW (ref 31.5–35.7)
MCV: 85 fL (ref 79–97)
Platelets: 240 10*3/uL (ref 150–450)
RBC: 4.57 x10E6/uL (ref 3.77–5.28)
RDW: 15 % (ref 11.7–15.4)
WBC: 9.5 10*3/uL (ref 3.4–10.8)

## 2019-09-25 LAB — TSH: TSH: 0.696 u[IU]/mL (ref 0.450–4.500)

## 2019-10-04 ENCOUNTER — Other Ambulatory Visit: Payer: Self-pay | Admitting: Cardiology

## 2019-10-04 NOTE — Telephone Encounter (Signed)
Pt last saw Francis Dowse, [place] on 09/24/19, last labs 09/24/19 Creat 0.91, age 69, weight 102.5kg, based on specified criteria pt is on appropriate dosage of Eliquis 5mg  BID.  Will refill rx.

## 2019-11-06 DIAGNOSIS — G4733 Obstructive sleep apnea (adult) (pediatric): Secondary | ICD-10-CM | POA: Diagnosis not present

## 2019-11-06 DIAGNOSIS — I10 Essential (primary) hypertension: Secondary | ICD-10-CM | POA: Diagnosis not present

## 2019-11-12 ENCOUNTER — Other Ambulatory Visit: Payer: Self-pay | Admitting: Cardiology

## 2019-11-21 ENCOUNTER — Telehealth: Payer: Self-pay | Admitting: Cardiology

## 2019-11-21 NOTE — Telephone Encounter (Signed)
Patient calling the office for samples of medication:   1.  What medication and dosage are you requesting samples for? Eliquis  2.  Are you currently out of this medication?  She sad she is in the donut hole and need some help

## 2019-11-21 NOTE — Telephone Encounter (Signed)
Will do, thanks

## 2019-11-21 NOTE — Telephone Encounter (Signed)
Hey Lynn, LPN, can you please advise on this matter? Thanks  ?

## 2019-11-21 NOTE — Telephone Encounter (Signed)
**Note De-Identified Mary Poole Obfuscation** The pt and I discussed Mary Poole applying for pt asst for Mary Poole Eliquis through [employer] and she is interested. I gave Mary Poole [employer] phone number and advised Mary Poole to call them with questions concerning their Eliquis program and that if it seems that she would be eligable to be approved for asst to request that they mail Mary Poole an application to Mary Poole home.  She is aware that once she receives the application to complete Mary Poole part, obtain required documents per [employer], and to bring all to Dr [employer] office at [address] [address] in [place], [place] to drop off and that we will take care of the provider page of the application and will fax all to [employer] Pt [employer] program.  Also, she is aware that we are leaving Mary Poole 2 boxes of Eliquis 5 mg samples for he to pick up in the front office.

## 2019-12-05 DIAGNOSIS — H68103 Unspecified obstruction of Eustachian tube, bilateral: Secondary | ICD-10-CM | POA: Diagnosis not present

## 2019-12-06 ENCOUNTER — Telehealth: Payer: Self-pay | Admitting: Cardiology

## 2019-12-06 ENCOUNTER — Other Ambulatory Visit: Payer: Self-pay | Admitting: *Deleted

## 2019-12-06 MED ORDER — APIXABAN 5 MG PO TABS
5.0000 mg | ORAL_TABLET | Freq: Two times a day (BID) | ORAL | 5 refills | Status: DC
Start: 2019-12-06 — End: 2020-06-16

## 2019-12-06 NOTE — Telephone Encounter (Signed)
Called the patient and advised her that the paper prescription was at the front desk for her to pick up.   Verbalized understanding.

## 2019-12-06 NOTE — Telephone Encounter (Signed)
Patient would like a copy of her ELIQUIS 5 MG TABS tablet prescription to use for purposes of finding a cheaper option to obtain medication.   Patient says if possible e-mail to [email or web address]  , if cannot e-mail she will be fine to pick it up. Please call/advise   Thank you!

## 2019-12-08 ENCOUNTER — Ambulatory Visit: Payer: Medicare Other | Attending: Internal Medicine

## 2019-12-08 DIAGNOSIS — Z23 Encounter for immunization: Secondary | ICD-10-CM

## 2019-12-08 NOTE — Progress Notes (Signed)
   Covid-19 Vaccination Clinic  Name:  Mary Poole    MRN: [id number] DOB: March 19, 1950  12/08/2019  [name] was observed post Covid-19 immunization for 15 minutes without incident. She was provided with Vaccine Information Sheet and instruction to access the V-Safe system.   [name] was instructed to call 911 with any severe reactions post vaccine: Marland Kitchen Difficulty breathing  . Swelling of face and throat  . A fast heartbeat  . A bad rash all over body  . Dizziness and weakness

## 2020-01-16 ENCOUNTER — Other Ambulatory Visit: Payer: Self-pay | Admitting: Family Medicine

## 2020-01-16 ENCOUNTER — Other Ambulatory Visit: Payer: Self-pay | Admitting: Cardiology

## 2020-01-16 NOTE — Telephone Encounter (Signed)
Please see message and advise.  Thank you. ° °

## 2020-01-17 ENCOUNTER — Telehealth: Payer: Self-pay | Admitting: Cardiology

## 2020-01-17 NOTE — Telephone Encounter (Signed)
*  STAT* If patient is at the pharmacy, call can be transferred to refill team.   1. Which medications need to be refilled? (please list name of each medication and dose if known) Diltiazem Metoprolol furosemide  2. Which pharmacy/location (including street and city if local pharmacy) is medication to [facility] to? [facility]  3. Do they need a 30 day or 90 day supply? 90

## 2020-01-17 NOTE — Telephone Encounter (Signed)
Called pt to inform her that her medications were already sent to her pharmacy as requested. I advised the pt to call her pharmacy and talk with a live person, because the automatic system will not recognize a new prescription and if she has any other problems, questions or concerns, to give our office a call back. Pt verbalized understanding.

## 2020-02-27 ENCOUNTER — Other Ambulatory Visit: Payer: Self-pay | Admitting: Cardiology

## 2020-02-27 NOTE — Telephone Encounter (Signed)
Outpatient Medication Detail   Disp Refills Start End   metoprolol succinate (TOPROL-XL) 50 MG 24 hr tablet 90 tablet 0 01/16/2020    Sig - Route: Take 1 tablet (50 mg total) by mouth daily. Take with or immediately following a meal. - Oral   Sent to pharmacy as: metoprolol succinate (TOPROL-XL) 50 MG 24 hr tablet   Notes to Pharmacy: Pt needs to keep upcoming appt in Feb for further refills   E-Prescribing Status: Receipt confirmed by pharmacy (01/16/2020  5:09 PM EST)     Pharmacy  [id number] - [place], [place] - [address]

## 2020-02-27 NOTE — Telephone Encounter (Signed)
Outpatient Medication Detail   Disp Refills Start End   diltiazem (CARDIZEM CD) 240 MG 24 hr capsule 90 capsule 0 01/16/2020    Sig - Route: Take 1 capsule (240 mg total) by mouth daily. Pt needs to keep upcoming appt in Feb for further refills - Oral   Sent to pharmacy as: diltiazem (CARDIZEM CD) 240 MG 24 hr capsule   E-Prescribing Status: Receipt confirmed by pharmacy (01/16/2020  5:05 PM EST)     Pharmacy  [id number] - [place], [place] - [address]

## 2020-03-18 ENCOUNTER — Telehealth: Payer: Medicare Other | Admitting: Cardiology

## 2020-03-21 ENCOUNTER — Telehealth (INDEPENDENT_AMBULATORY_CARE_PROVIDER_SITE_OTHER): Payer: Medicare Other | Admitting: Cardiology

## 2020-03-21 ENCOUNTER — Encounter: Payer: Self-pay | Admitting: Cardiology

## 2020-03-21 ENCOUNTER — Other Ambulatory Visit: Payer: Self-pay

## 2020-03-21 VITALS — BP 142/77 | HR 61 | Ht 65.0 in | Wt 226.0 lb

## 2020-03-21 DIAGNOSIS — G4733 Obstructive sleep apnea (adult) (pediatric): Secondary | ICD-10-CM

## 2020-03-21 DIAGNOSIS — I10 Essential (primary) hypertension: Secondary | ICD-10-CM

## 2020-03-21 NOTE — Progress Notes (Signed)
Virtual Visit via Video Note   This visit type was conducted due to national recommendations for restrictions regarding the COVID-19 Pandemic (e.g. social distancing) in an effort to limit this patient's exposure and mitigate transmission in our community.  Due to her co-morbid illnesses, this patient is at least at moderate risk for complications without adequate follow up.  This format is felt to be most appropriate for this patient at this time.  All issues noted in this document were discussed and addressed.  A limited physical exam was performed with this format.  Please refer to the patient's chart for her consent to telehealth for [facility].  Evaluation Performed:  Follow-up visit  This visit type was conducted due to national recommendations for restrictions regarding the COVID-19 Pandemic (e.g. social distancing).  This format is felt to be most appropriate for this patient at this time.  All issues noted in this document were discussed and addressed.  No physical exam was performed (except for noted visual exam findings with Video Visits).  Please refer to the patient's chart (MyChart message for video visits and phone note for telephone visits) for the patient's consent to telehealth for [facility].  Date:  03/21/2020   ID:  Mary Poole, DOB 1950-07-12, MRN [id number]  Patient Location:  Home  Provider location:   [place]  PCP:  Everrett Coombe, DO  Sleep medicine:  Armanda Magic, MD  Electrophysiologist:  Will Jorja Loa, MD   Chief Complaint:  OSA  History of Present Illness:    Mary Poole is a 70 y.o. female who presents via audio/video conferencing for a telehealth visit today.    Mary Poole is a 70 y.o. female with a hx of atrial fibrillation, hypertension and GERD.  She was referred by Dr. Katrinka Blazing for sleep study.  She underwent home sleep study a year ago which showed an AHI of 15.3 with lowest oxygen and desaturation at 80%.  She underwent CPAP  titration in May 2019 and was titrated to 10 cm H2O.   She is doing well with his CPAP device and thinks that he has gotten used to it.  She tolerates the nasal mask but does not use a chin strap.  She is having problems with bloating and thinks the pressure needs to be turned down.  Since going on CPAP he feels rested in the am and has no significant daytime sleepiness unless she stays up late the night before.  She has been having a lot of mouth dryness. She says that her husband says she is not snoring..   The patient does not have symptoms concerning for COVID-19 infection (fever, chills, cough, or new shortness of breath).   Prior CV studies:   The following studies were reviewed today:  PAP compliance download  Past Medical History:  Diagnosis Date  . Anxiety   . Atrial fibrillation (HCC)   . Complication of anesthesia    "woke up during colonoscopy 02/2017" (11/09/2017)  . GERD (gastroesophageal reflux disease)   . H/O atrial flutter   . History of blood transfusion 02/2017   LGIB (11/09/2017)  . Hypertension   . Migraine    "a few/year" (11/09/2017)  . OSA on CPAP    Past Surgical History:  Procedure Laterality Date  . ATRIAL FIBRILLATION ABLATION N/A 11/09/2017   Procedure: ATRIAL FIBRILLATION ABLATION;  Surgeon: Regan Lemming, MD;  Location: [facility] LAB;  Service: Cardiovascular;  Laterality: N/A;  . COLONOSCOPY N/A 02/24/2017  Procedure: COLONOSCOPY;  Surgeon: Jeani Hawking, MD;  Location: [facility] ENDOSCOPY;  Service: Gastroenterology;  Laterality: N/A;  . ENTEROSCOPY N/A 02/24/2017   Procedure: ENTEROSCOPY;  Surgeon: Jeani Hawking, MD;  Location: [facility] ENDOSCOPY;  Service: Gastroenterology;  Laterality: N/A;  . HERNIA REPAIR  1960s  . WISDOM TOOTH EXTRACTION       Current Meds  Medication Sig  . apixaban (ELIQUIS) 5 MG TABS tablet Take 1 tablet (5 mg total) by mouth 2 (two) times daily.  Marland Kitchen diltiazem (CARDIZEM CD) 240 MG 24 hr capsule Take 1 capsule (240 mg total)  by mouth daily. Pt needs to keep upcoming appt in Feb for further refills  . escitalopram (LEXAPRO) 10 MG tablet Take 1qd (Must sched appt with new provider for future fills)  . furosemide (LASIX) 20 MG tablet TAKE 1 TABLET BY MOUTH EVERY DAY AS NEEDED FOR SWELLING  . losartan (COZAAR) 50 MG tablet TAKE 1 TABLET BY MOUTH EVERY DAY  . metoprolol succinate (TOPROL-XL) 50 MG 24 hr tablet Take 1 tablet (50 mg total) by mouth daily. Take with or immediately following a meal.  . Multiple Vitamin (MULTIVITAMIN) LIQD Take 30 mLs by mouth daily.  Marland Kitchen omeprazole (PRILOSEC) 40 MG capsule TAKE 1 CAPSULE BY MOUTH EVERY DAY  . PRESCRIPTION MEDICATION Inhale into the lungs at bedtime. CPAP     Allergies:   Diphenhydramine hcl and Flecainide   Social History   Tobacco Use  . Smoking status: Former Smoker    Packs/day: 0.25    Years: 10.00    Pack years: 2.50    Types: Cigarettes  . Smokeless tobacco: Never Used  . Tobacco comment: 11/09/2017 "quit smoking in the 1980s"  Vaping Use  . Vaping Use: Never used  Substance Use Topics  . Alcohol use: Never  . Drug use: Never     Family Hx: The patient's family history includes AAA (abdominal aortic aneurysm) in her sister; Allergies in an other family member; CAD in her father; Clotting disorder in an other family member; Heart disease in an other family member; Hypertension in an other family member; Migraines in an other family member; Rheum arthritis in an other family member; Thyroid disease in her sister.  ROS:   Please see the history of present illness.     All other systems reviewed and are negative.   Labs/Other Tests and Data Reviewed:    Recent Labs: 09/24/2019: BUN 18; Creatinine, Ser 0.91; Hemoglobin 12.2; Platelets 240; Potassium 4.3; Sodium 140; TSH 0.696   Recent Lipid Panel No results found for: CHOL, TRIG, HDL, CHOLHDL, LDLCALC, LDLDIRECT  Wt Readings from Last 3 Encounters:  03/21/20 226 lb (102.5 kg)  09/24/19 226 lb (102.5  kg)  01/12/19 213 lb (96.6 kg)     Objective:    Vital Signs:  BP (!) 142/77   Pulse 61   Ht 5\' 5"  (1.651 m)   Wt 226 lb (102.5 kg)   BMI 37.61 kg/m    Well nourished, well developed female in no acute distress. Well appearing, alert and conversant, regular work of breathing,  good skin color  Eyes- anicteric mouth- oral mucosa is pink  neuro- grossly intact skin- no apparent rash or lesions or cyanosis   ASSESSMENT & PLAN:    1.  OSA - The patient is tolerating PAP therapy well without any problems. The PAP download was reviewed today and showed an AHI of 0.7/hr on 11 cm H2O with 100% compliance in using more than 4 hours nightly.  The patient has been using and benefiting from PAP use and will continue to benefit from therapy.  -since she is getting a lot of bloating and AHI is low, I will change to Auto CPAP from 4 to 12cm H2O and get a download in 2 weeks  2.  HTN -BP is well controlled on exam today -continue Cardizem CD 240mg  daily, Losartan 50mg  daily and Toprol XL 50mg  daily  3.  Morbid obesity -she has obesity with comorbid cardiac problems including obesity -I have encouraged her to get into a routine exercise program and cut back on carbs and portions.   COVID-19 Education: The signs and symptoms of COVID-19 were discussed with the patient and how to seek care for testing (follow up with PCP or arrange E-visit).  The importance of social distancing was discussed today.  Patient Risk:   After full review of this patient's clinical status, I feel that they are at least moderate risk at this time.  Time:   Today, I have spent 20 minutes  on telemedicine discussing medical problems including OSA, HTN, Obesity and reviewing patient's chart including PAP compliance download.  Medication Adjustments/Labs and Tests Ordered: Current medicines are reviewed at length with the patient today.  Concerns regarding medicines are outlined above.  Tests Ordered: No orders of  the defined types were placed in this encounter.  Medication Changes: No orders of the defined types were placed in this encounter.   Disposition:  Follow up in 1 year(s)  Signed, , MD  03/21/2020 8:14 AM    [facility]

## 2020-03-21 NOTE — Patient Instructions (Signed)
Medication Instructions:  Your physician recommends that you continue on your current medications as directed. Please refer to the Current Medication list given to you today.  *If you need a refill on your cardiac medications before your next appointment, please call your pharmacy*   Lab Work: None If you have labs (blood work) drawn today and your tests are completely normal, you will receive your results only by: . MyChart Message (if you have MyChart) OR . A paper copy in the mail If you have any lab test that is abnormal or we need to change your treatment, we will call you to review the results.   Testing/Procedures: None   Follow-Up: At [facility], you and your health needs are our priority.  As part of our continuing mission to provide you with exceptional heart care, we have created designated Provider Care Teams.  These Care Teams include your primary Cardiologist (physician) and Advanced Practice Providers (APPs -  Physician Assistants and Nurse Practitioners) who all work together to provide you with the care you need, when you need it.  We recommend signing up for the patient portal called "MyChart".  Sign up information is provided on this After Visit Summary.  MyChart is used to connect with patients for Virtual Visits (Telemedicine).  Patients are able to view lab/test results, encounter notes, upcoming appointments, etc.  Non-urgent messages can be sent to your provider as well.   To learn more about what you can do with MyChart, go to [email or web address]://[email or web address].com.    Your next appointment:   1 year(s)  The format for your next appointment:   In Person  Provider:   You may see Traci Turner, MD or one of the following Advanced Practice Providers on your designated Care Team:    Dayna Dunn, PA-C  Michele Lenze, PA-C    Other Instructions   

## 2020-03-26 ENCOUNTER — Telehealth: Payer: Self-pay | Admitting: *Deleted

## 2020-03-26 DIAGNOSIS — G4733 Obstructive sleep apnea (adult) (pediatric): Secondary | ICD-10-CM

## 2020-03-26 NOTE — Telephone Encounter (Signed)
-----   Message from Quintella Reichert, MD sent at 03/21/2020  8:17 AM EST ----- change to Auto CPAP from 4 to 12cm H2O and get a download in 2 weeks

## 2020-03-26 NOTE — Telephone Encounter (Signed)
Order placed to [facility] via community message. 

## 2020-03-28 DIAGNOSIS — I10 Essential (primary) hypertension: Secondary | ICD-10-CM | POA: Diagnosis not present

## 2020-03-28 DIAGNOSIS — G4733 Obstructive sleep apnea (adult) (pediatric): Secondary | ICD-10-CM | POA: Diagnosis not present

## 2020-03-31 ENCOUNTER — Ambulatory Visit: Payer: Medicare Other | Admitting: Cardiology

## 2020-04-10 ENCOUNTER — Ambulatory Visit (INDEPENDENT_AMBULATORY_CARE_PROVIDER_SITE_OTHER): Payer: Medicare Other | Admitting: Family Medicine

## 2020-04-10 ENCOUNTER — Other Ambulatory Visit: Payer: Self-pay

## 2020-04-10 ENCOUNTER — Encounter: Payer: Self-pay | Admitting: Family Medicine

## 2020-04-10 VITALS — BP 134/82 | HR 60 | Temp 98.4°F | Wt 229.0 lb

## 2020-04-10 DIAGNOSIS — Z780 Asymptomatic menopausal state: Secondary | ICD-10-CM | POA: Diagnosis not present

## 2020-04-10 DIAGNOSIS — I10 Essential (primary) hypertension: Secondary | ICD-10-CM

## 2020-04-10 DIAGNOSIS — S76211A Strain of adductor muscle, fascia and tendon of right thigh, initial encounter: Secondary | ICD-10-CM | POA: Diagnosis not present

## 2020-04-10 DIAGNOSIS — Z1231 Encounter for screening mammogram for malignant neoplasm of breast: Secondary | ICD-10-CM | POA: Diagnosis not present

## 2020-04-10 DIAGNOSIS — I480 Paroxysmal atrial fibrillation: Secondary | ICD-10-CM | POA: Diagnosis not present

## 2020-04-10 DIAGNOSIS — K219 Gastro-esophageal reflux disease without esophagitis: Secondary | ICD-10-CM

## 2020-04-10 DIAGNOSIS — Z13820 Encounter for screening for osteoporosis: Secondary | ICD-10-CM | POA: Diagnosis not present

## 2020-04-10 DIAGNOSIS — S76219A Strain of adductor muscle, fascia and tendon of unspecified thigh, initial encounter: Secondary | ICD-10-CM | POA: Insufficient documentation

## 2020-04-10 MED ORDER — OMEPRAZOLE 40 MG PO CPDR
40.0000 mg | DELAYED_RELEASE_CAPSULE | Freq: Every day | ORAL | 3 refills | Status: DC
Start: 2020-04-10 — End: 2021-04-16

## 2020-04-10 NOTE — Assessment & Plan Note (Signed)
Omeprazole renewed. °

## 2020-04-10 NOTE — Assessment & Plan Note (Signed)
Blood pressure is at goal at for age and co-morbidities.  I recommend continuation of current medications.  In addition they were instructed to follow a low sodium diet with regular exercise to help to maintain adequate control of blood pressure.  ? ?

## 2020-04-10 NOTE — Progress Notes (Signed)
Mary Poole - 70 y.o. female MRN [id number]  Date of birth: [birth year]  Subjective Chief Complaint  Patient presents with  . Establish Care    HPI Mary Poole is a 70 y.o. female here today to re-establish care.  She has a history of HTN, PAF, GERD.  She has complaint today of groin pain.   Reports going for a walk a few days ago and after resting noted pain in her R groin area.  Pain now comes and goes. No catching sensation.  No radiation of pain, no numbness or tingling.  She has not tried anything for treatment so far.    She sees cardiology for management of A. Fib. Rate controlled with diltiazem and metoprolol.  She is anticoagulated with eliquis.  She requests refill of omeprazole.     ROS:  A comprehensive ROS was completed and negative except as noted per HPI  Allergies  Allergen Reactions  . Diphenhydramine Hcl Hives and Other (See Comments)    Erratic heartbeat  . Flecainide Rash    Past Medical History:  Diagnosis Date  . Anxiety   . Atrial fibrillation (HCC)   . Complication of anesthesia    "woke up during colonoscopy 02/2017" (11/09/2017)  . GERD (gastroesophageal reflux disease)   . H/O atrial flutter   . History of blood transfusion 02/2017   LGIB (11/09/2017)  . Hypertension   . Migraine    "a few/year" (11/09/2017)  . OSA on CPAP     Past Surgical History:  Procedure Laterality Date  . ATRIAL FIBRILLATION ABLATION N/A 11/09/2017   Procedure: ATRIAL FIBRILLATION ABLATION;  Surgeon: Regan Lemming, MD;  Location: [facility];  Service: Cardiovascular;  Laterality: N/A;  . COLONOSCOPY N/A 02/24/2017   Procedure: COLONOSCOPY;  Surgeon: Jeani Hawking, MD;  Location: [facility] ENDOSCOPY;  Service: Gastroenterology;  Laterality: N/A;  . ENTEROSCOPY N/A 02/24/2017   Procedure: ENTEROSCOPY;  Surgeon: Jeani Hawking, MD;  Location: [facility] ENDOSCOPY;  Service: Gastroenterology;  Laterality: N/A;  . HERNIA REPAIR  1960s  . WISDOM TOOTH EXTRACTION       Social History   Socioeconomic History  . Marital status: Married    Spouse name: Not on file  . Number of children: Not on file  . Years of education: Not on file  . Highest education level: Not on file  Occupational History  . Not on file  Tobacco Use  . Smoking status: Former Smoker    Packs/day: 0.25    Years: 10.00    Pack years: 2.50    Types: Cigarettes  . Smokeless tobacco: Never Used  . Tobacco comment: 11/09/2017 "quit smoking in the 1980s"  Vaping Use  . Vaping Use: Never used  Substance and Sexual Activity  . Alcohol use: Never  . Drug use: Never  . Sexual activity: Not Currently  Other Topics Concern  . Not on file  Social History Narrative  . Not on file   Social Determinants of Health   Financial Resource Strain: Not on file  Food Insecurity: Not on file  Transportation Needs: Not on file  Physical Activity: Not on file  Stress: Not on file  Social Connections: Not on file    Family History  Problem Relation Age of Onset  . CAD Father   . AAA (abdominal aortic aneurysm) Sister   . Thyroid disease Sister   . Allergies Other   . Clotting disorder Other   . Hypertension Other   . Heart disease Other   .  Rheum arthritis Other   . Migraines Other     Health Maintenance  Topic Date Due  . Hepatitis C Screening  Never done  . MAMMOGRAM  Never done  . DEXA SCAN  Never done  . PNA vac Low Risk Adult (1 of 2 - PCV13) Never done  . COLONOSCOPY (Pts 45-65yrs Insurance coverage will need to be confirmed)  02/25/2027  . COVID-19 Vaccine  Completed  . HPV VACCINES  Aged Out  . INFLUENZA VACCINE  Discontinued  . TETANUS/TDAP  Discontinued     ----------------------------------------------------------------------------------------------------------------------------------------------------------------------------------------------------------------- Physical Exam BP 134/82 (BP Location: Left Arm, Patient Position: Sitting, Cuff Size: Large)    Pulse 60   Temp 98.4 F (36.9 C)   Wt 229 lb (103.9 kg)   SpO2 97%   BMI 38.11 kg/m   Physical Exam Constitutional:      Appearance: Normal appearance.  Eyes:     General: No scleral icterus. Cardiovascular:     Rate and Rhythm: Normal rate and regular rhythm.  Pulmonary:     Effort: Pulmonary effort is normal.     Breath sounds: Normal breath sounds.  Musculoskeletal:     Cervical back: Neck supple.     Comments: Hip ROM is normal.  Mild pain with resisted adduction.  Hip scour test without significant pain.   Neurological:     General: No focal deficit present.     Mental Status: She is alert.  Psychiatric:        Mood and Affect: Mood normal.        Behavior: Behavior normal.     ------------------------------------------------------------------------------------------------------------------------------------------------------------------------------------------------------------------- Assessment and Plan  Paroxysmal atrial fibrillation (HCC) Managed by cardiology, stable at this time with diltiazem and metoprolol.  Anticoagulated with eliquis.    Essential hypertension Blood pressure is at goal at for age and co-morbidities.  I recommend continuation of current medications.  In addition they were instructed to follow a low sodium diet with regular exercise to help to maintain adequate control of blood pressure.    GERD (gastroesophageal reflux disease) Omeprazole renewed.    Groin strain Given handout for home groin rehab program.  She may use tylenol as needed.  Instructed to avoid NSAIDS.  Instructed to contact clinic if not improving.    Meds ordered this encounter  Medications  . omeprazole (PRILOSEC) 40 MG capsule    Sig: Take 1 capsule (40 mg total) by mouth daily.    Dispense:  90 capsule    Refill:  3    No follow-ups on file.    This visit occurred during the SARS-CoV-2 public health emergency.  Safety protocols were in place, including  screening questions prior to the visit, additional usage of staff PPE, and extensive cleaning of exam room while observing appropriate contact time as indicated for disinfecting solutions.

## 2020-04-10 NOTE — Assessment & Plan Note (Signed)
Given handout for home groin rehab program.  She may use tylenol as needed.  Instructed to avoid NSAIDS.  Instructed to contact clinic if not improving.

## 2020-04-10 NOTE — Patient Instructions (Signed)
Great to see you today! Try rehab exercises. Let me know if not improving.

## 2020-04-10 NOTE — Assessment & Plan Note (Signed)
Managed by cardiology, stable at this time with diltiazem and metoprolol.  Anticoagulated with eliquis.

## 2020-04-12 ENCOUNTER — Other Ambulatory Visit: Payer: Self-pay | Admitting: Cardiology

## 2020-04-21 ENCOUNTER — Other Ambulatory Visit: Payer: Self-pay | Admitting: Cardiology

## 2020-05-19 ENCOUNTER — Other Ambulatory Visit: Payer: Self-pay

## 2020-05-19 ENCOUNTER — Encounter: Payer: Self-pay | Admitting: Cardiology

## 2020-05-19 ENCOUNTER — Ambulatory Visit: Payer: Medicare Other | Admitting: Cardiology

## 2020-05-19 VITALS — BP 144/86 | HR 84 | Ht 65.0 in | Wt 222.0 lb

## 2020-05-19 DIAGNOSIS — I480 Paroxysmal atrial fibrillation: Secondary | ICD-10-CM

## 2020-05-19 NOTE — Patient Instructions (Signed)
Medication Instructions:  °Your physician recommends that you continue on your current medications as directed. Please refer to the Current Medication list given to you today. ° °*If you need a refill on your cardiac medications before your next appointment, please call your pharmacy* ° ° °Lab Work: °None ordered ° ° °Testing/Procedures: °None ordered ° ° °Follow-Up: °At [facility], you and your health needs are our priority.  As part of our continuing mission to provide you with exceptional heart care, we have created designated Provider Care Teams.  These Care Teams include your primary Cardiologist (physician) and Advanced Practice Providers (APPs -  Physician Assistants and Nurse Practitioners) who all work together to provide you with the care you need, when you need it. ° °Your next appointment:   °6 month(s) ° °The format for your next appointment:   °In Person ° °Provider:   °Will Camnitz, MD ° ° ° °Thank you for choosing [facility]!! ° ° °Dyon Rotert, RN °[phone] °  °

## 2020-05-19 NOTE — Progress Notes (Signed)
Electrophysiology Office Note   Date:  05/19/2020   ID:  Mary Poole, DOB 03-Nov-1950, MRN [id number]  PCP:  Mary Coombe, DO  Cardiologist:  Mary Poole Primary Electrophysiologist:  Mary Correia Jorja Loa, MD    No chief complaint on file.    History of Present Illness: Mary Poole is a 70 y.o. female who is being seen today for the evaluation of atrial flutter at the request of Mary Coombe, DO. Presenting today for electrophysiology evaluation.   She has a history significant for hypertension, palpitations, and atrial fibrillation.  She was also noted to have a wide-complex tachycardia, though on slowing of tachycardia, it was noted that she was in atrial flutter.  She is now status post A. fib/flutter ablation 11/09/2017.  Today, denies symptoms of palpitations, chest pain, shortness of breath, orthopnea, PND, lower extremity edema, claudication, dizziness, presyncope, syncope, bleeding, or neurologic sequela. The patient is tolerating medications without difficulties.  Since last being seen she has done well.  She has no chest pain or shortness of breath.  Unfortunately she has an abdominal strain and has been recovering from that.  That is keeping her from doing all of her daily activities, she has no cardiac complaints at this time.   Past Medical History:  Diagnosis Date  . Anxiety   . Atrial fibrillation (HCC)   . Complication of anesthesia    "woke up during colonoscopy 02/2017" (11/09/2017)  . GERD (gastroesophageal reflux disease)   . H/O atrial flutter   . History of blood transfusion 02/2017   LGIB (11/09/2017)  . Hypertension   . Migraine    "a few/year" (11/09/2017)  . OSA on CPAP    Past Surgical History:  Procedure Laterality Date  . ATRIAL FIBRILLATION ABLATION N/A 11/09/2017   Procedure: ATRIAL FIBRILLATION ABLATION;  Surgeon: Regan Lemming, MD;  Location: [facility] INVASIVE CV LAB;  Service: Cardiovascular;  Laterality: N/A;  . COLONOSCOPY N/A 02/24/2017    Procedure: COLONOSCOPY;  Surgeon: Jeani Hawking, MD;  Location: [facility] ENDOSCOPY;  Service: Gastroenterology;  Laterality: N/A;  . ENTEROSCOPY N/A 02/24/2017   Procedure: ENTEROSCOPY;  Surgeon: Jeani Hawking, MD;  Location: [facility] ENDOSCOPY;  Service: Gastroenterology;  Laterality: N/A;  . HERNIA REPAIR  1960s  . WISDOM TOOTH EXTRACTION       Current Outpatient Medications  Medication Sig Dispense Refill  . apixaban (ELIQUIS) 5 MG TABS tablet Take 1 tablet (5 mg total) by mouth 2 (two) times daily. 60 tablet 5  . diltiazem (CARDIZEM CD) 240 MG 24 hr capsule TAKE 1 CAPSULE BY MOUTH DAILY. PT NEEDS TO KEEP UPCOMING APPT IN FEB FOR FURTHER REFILLS 90 capsule 0  . furosemide (LASIX) 20 MG tablet TAKE 1 TABLET BY MOUTH EVERY DAY AS NEEDED FOR SWELLING 90 tablet 2  . losartan (COZAAR) 50 MG tablet TAKE 1 TABLET BY MOUTH EVERY DAY 30 tablet 6  . metoprolol succinate (TOPROL-XL) 50 MG 24 hr tablet TAKE 1 TABLET BY MOUTH DAILY. TAKE WITH OR IMMEDIATELY FOLLOWING A MEAL. 90 tablet 1  . Multiple Vitamin (MULTIVITAMIN) LIQD Take 30 mLs by mouth daily.    Marland Kitchen omeprazole (PRILOSEC) 40 MG capsule Take 1 capsule (40 mg total) by mouth daily. 90 capsule 3  . PRESCRIPTION MEDICATION Inhale into the lungs at bedtime. CPAP     No current facility-administered medications for this visit.    Allergies:   Diphenhydramine hcl and Flecainide   Social History:  The patient  reports that she has quit smoking. Her smoking  use included cigarettes. She has a 2.50 pack-year smoking history. She has never used smokeless tobacco. She reports that she does not drink alcohol and does not use drugs.   Family History:  The patient's family history includes AAA (abdominal aortic aneurysm) in her sister; Allergies in an other family member; CAD in her father; Clotting disorder in an other family member; Heart disease in an other family member; Hypertension in an other family member; Migraines in an other family member; Rheum arthritis in  an other family member; Thyroid disease in her sister.   ROS:  Please see the history of present illness.   Otherwise, review of systems is positive for none.   All other systems are reviewed and negative.   PHYSICAL EXAM: VS:  BP (!) 144/86   Pulse 84   Ht 5\' 5"  (1.651 m)   Wt 222 lb (100.7 kg)   SpO2 94%   BMI 36.94 kg/m  , BMI Body mass index is 36.94 kg/m. GEN: Well nourished, well developed, in no acute distress  HEENT: normal  Neck: no JVD, carotid bruits, or masses Cardiac: RRR; no murmurs, rubs, or gallops,no edema  Respiratory:  clear to auscultation bilaterally, normal work of breathing GI: soft, nontender, nondistended, + BS MS: no deformity or atrophy  Skin: warm and dry Neuro:  Strength and sensation are intact Psych: euthymic mood, full affect  EKG:  EKG is ordered today. Personal review of the ekg ordered shows sinus rhythm, rate 84  Recent Labs: 09/24/2019: BUN 18; Creatinine, Ser 0.91; Hemoglobin 12.2; Platelets 240; Potassium 4.3; Sodium 140; TSH 0.696    Lipid Panel  No results found for: CHOL, TRIG, HDL, CHOLHDL, VLDL, LDLCALC, LDLDIRECT   Wt Readings from Last 3 Encounters:  05/19/20 222 lb (100.7 kg)  04/10/20 229 lb (103.9 kg)  03/21/20 226 lb (102.5 kg)      Other studies Reviewed: Additional studies/ records that were reviewed today include: Myoview 10/06/16  Review of the above records today demonstrates:   Nuclear stress EF: 58%. The left ventricular ejection fraction is normal (55-65%).  The study is normal. There is breast attenuation. No ischemia . no evidence of infarction  This is a low risk study.  Holter 10/09/16 - personally reviewed  Basic rhythm is NSR rare PAC's and PVC's.  Brief SVT < 10 beats.  Heart rate range 43-113 bpm with average 63 bpm.   NSR Normal study  TTE 07/19/16 LEFT VENTRICLE The left ventricle is borderline dilated. There is normal left ventricular  wall thickness. Left ventricular systolic function is  normal. LV ejection  fraction = 50-55%. Left ventricular filling pattern is indeterminate. No  segmental wall motion abnormalities seen in the left ventricle. LEFT ATRIUM The left atrium is mildly dilated. AORTIC VALVE The aortic valve is not well visualized. There is no aortic regurgitation.    ASSESSMENT AND PLAN:  1.  Atrial fibrillation/flutter: Status post ablation 11/09/2017.  Currently on Eliquis, diltiazem, metoprolol.  CHA2DS2-VASc 3.  She remains in sinus rhythm.  No changes.   2.  Short PR interval: No accessory pathway noted EP study.  No changes.  Short PR interval: No accessory pathway found at EP study.  No changes.  3.  Hypertension: Mildly elevated today.  Usually well controlled.  No changes.  4.  Obstructive sleep apnea: CPAP compliance encouraged    Current medicines are reviewed at length with the patient today.   The patient does not have concerns regarding her medicines.  The following  changes were made today: None  Labs/ tests ordered today include:  Orders Placed This Encounter  Procedures  . EKG 12-Lead     Disposition:   FU with Francyne Arreaga 6 months  Signed, Nemesis Rainwater Jorja Loa, MD  05/19/2020 10:57 AM     [facility] [address] [place] [address] [phone] (office) [phone] (fax)

## 2020-06-14 ENCOUNTER — Other Ambulatory Visit: Payer: Self-pay | Admitting: Cardiology

## 2020-06-16 NOTE — Telephone Encounter (Signed)
Pt's age 70, wt 100.7 kg, SCr 0.91, CrCl 92.75, last ov w/ WC 05/19/20.

## 2020-06-26 DIAGNOSIS — G4733 Obstructive sleep apnea (adult) (pediatric): Secondary | ICD-10-CM | POA: Diagnosis not present

## 2020-06-27 ENCOUNTER — Telehealth: Payer: Self-pay | Admitting: Family Medicine

## 2020-06-27 NOTE — Progress Notes (Signed)
  Chronic Care Management   Outreach Note  06/27/2020 Name: Mary Poole MRN: [id number] DOB: Dec 31, 1950  Referred by: Everrett Coombe, DO Reason for referral : No chief complaint on file.   An unsuccessful telephone outreach was attempted today. The patient was referred to the pharmacist for assistance with care management and care coordination.   Follow Up Plan:   Carmell [place] Upstream Scheduler

## 2020-07-03 ENCOUNTER — Telehealth: Payer: Self-pay | Admitting: *Deleted

## 2020-07-03 NOTE — Telephone Encounter (Signed)
-----   Message from Quintella Reichert, MD sent at 07/02/2020  8:55 AM EDT ----- Good AHI and compliance.  Continue current PAP settings.

## 2020-07-03 NOTE — Telephone Encounter (Signed)
The patient has been notified of the result and verbalized understanding.  All questions (if any) were answered. Latrelle Dodrill, CMA 07/03/2020 5:58 PM

## 2020-07-12 ENCOUNTER — Other Ambulatory Visit: Payer: Self-pay | Admitting: Cardiology

## 2020-07-15 ENCOUNTER — Telehealth: Payer: Self-pay | Admitting: Family Medicine

## 2020-07-15 NOTE — Progress Notes (Signed)
  Chronic Care Management   Outreach Note  07/15/2020 Name: Mary Poole MRN: [id number] DOB: November 03, 1950  Referred by: Everrett Coombe, DO Reason for referral : No chief complaint on file.   A second unsuccessful telephone outreach was attempted today. The patient was referred to pharmacist for assistance with care management and care coordination.  Follow Up Plan:   Carmell [place] Upstream Scheduler

## 2020-08-01 DIAGNOSIS — I10 Essential (primary) hypertension: Secondary | ICD-10-CM | POA: Diagnosis not present

## 2020-08-01 DIAGNOSIS — G4733 Obstructive sleep apnea (adult) (pediatric): Secondary | ICD-10-CM | POA: Diagnosis not present

## 2020-08-05 ENCOUNTER — Telehealth: Payer: Self-pay | Admitting: Family Medicine

## 2020-08-05 ENCOUNTER — Ambulatory Visit: Payer: Medicare Other | Attending: Internal Medicine

## 2020-08-05 ENCOUNTER — Other Ambulatory Visit (HOSPITAL_BASED_OUTPATIENT_CLINIC_OR_DEPARTMENT_OTHER): Payer: Self-pay

## 2020-08-05 ENCOUNTER — Other Ambulatory Visit: Payer: Self-pay

## 2020-08-05 DIAGNOSIS — Z23 Encounter for immunization: Secondary | ICD-10-CM

## 2020-08-05 MED ORDER — PFIZER-BIONT COVID-19 VAC-TRIS 30 MCG/0.3ML IM SUSP
INTRAMUSCULAR | 0 refills | Status: DC
  Filled 2020-08-05: qty 0.3, 1d supply, fill #0

## 2020-08-05 NOTE — Progress Notes (Signed)
   Covid-19 Vaccination Clinic  Name:  CHASLYN EISEN    MRN: [id number] DOB: 1950/11/16  08/05/2020  Ms. Ng was observed post Covid-19 immunization for 15 minutes without incident. She was provided with Vaccine Information Sheet and instruction to access the V-Safe system.   Ms. Seward was instructed to call 911 with any severe reactions post vaccine: Difficulty breathing  Swelling of face and throat  A fast heartbeat  A bad rash all over body  Dizziness and weakness   Immunizations Administered     Name Date Dose VIS Date Route   PFIZER Comrnaty(Gray TOP) Covid-19 Vaccine 08/05/2020  2:46 PM 0.3 mL 01/17/2020 Intramuscular   Manufacturer: [employer], [employer]   Lot: [id number]   NDC: [phone]

## 2020-08-05 NOTE — Progress Notes (Signed)
  Chronic Care Management   Outreach Note  08/05/2020 Name: JESSIC STANDIFER MRN: [id number] DOB: 1950-12-21  Referred by: Everrett Coombe, DO Reason for referral : No chief complaint on file.   Third unsuccessful telephone outreach was attempted today. The patient was referred to the pharmacist for assistance with care management and care coordination.   Follow Up Plan:   Carmell [place] Upstream Scheduler

## 2020-08-05 NOTE — Progress Notes (Signed)
error 

## 2020-08-09 ENCOUNTER — Other Ambulatory Visit: Payer: Self-pay | Admitting: Cardiology

## 2020-08-18 ENCOUNTER — Other Ambulatory Visit: Payer: Self-pay | Admitting: Cardiology

## 2020-10-17 ENCOUNTER — Other Ambulatory Visit: Payer: Self-pay | Admitting: Cardiology

## 2020-11-20 ENCOUNTER — Ambulatory Visit: Payer: Medicare Other | Admitting: Cardiology

## 2020-12-19 ENCOUNTER — Other Ambulatory Visit: Payer: Self-pay

## 2020-12-19 ENCOUNTER — Other Ambulatory Visit (HOSPITAL_BASED_OUTPATIENT_CLINIC_OR_DEPARTMENT_OTHER): Payer: Self-pay

## 2020-12-19 ENCOUNTER — Ambulatory Visit: Payer: Medicare Other | Attending: Internal Medicine

## 2020-12-19 DIAGNOSIS — Z23 Encounter for immunization: Secondary | ICD-10-CM

## 2020-12-19 MED ORDER — PFIZER COVID-19 VAC BIVALENT 30 MCG/0.3ML IM SUSP
INTRAMUSCULAR | 0 refills | Status: DC
  Filled 2020-12-19: qty 0.3, 1d supply, fill #0

## 2020-12-19 NOTE — Progress Notes (Signed)
   Covid-19 Vaccination Clinic  Name:  Mary Poole    MRN: [id number] DOB: 10-Feb-1950  12/19/2020  [name] was observed post Covid-19 immunization for 15 minutes without incident. She was provided with Vaccine Information Sheet and instruction to access the V-Safe system.   [name] was instructed to call 911 with any severe reactions post vaccine: Difficulty breathing  Swelling of face and throat  A fast heartbeat  A bad rash all over body  Dizziness and weakness   Immunizations Administered     Name Date Dose VIS Date Route   Pfizer Covid-19 Vaccine Bivalent Booster 12/19/2020  3:16 PM 0.3 mL 10/08/2020 Intramuscular   Manufacturer: [employer], [employer]   Lot: [id number]   NDC: [phone]

## 2021-01-15 ENCOUNTER — Other Ambulatory Visit: Payer: Self-pay | Admitting: Cardiology

## 2021-02-13 ENCOUNTER — Ambulatory Visit: Payer: Medicare Other | Admitting: Cardiology

## 2021-03-11 ENCOUNTER — Other Ambulatory Visit: Payer: Self-pay

## 2021-03-11 ENCOUNTER — Ambulatory Visit (HOSPITAL_COMMUNITY)
Admission: RE | Admit: 2021-03-11 | Discharge: 2021-03-11 | Disposition: A | Discharge status: Home / Self Care | Payer: Medicare Other | Source: Ambulatory Visit | Attending: Physician Assistant | Admitting: Physician Assistant

## 2021-03-11 ENCOUNTER — Encounter (HOSPITAL_COMMUNITY): Payer: Self-pay | Admitting: Physician Assistant

## 2021-03-11 VITALS — BP 138/88 | HR 153 | Ht 65.0 in | Wt 222.6 lb

## 2021-03-11 DIAGNOSIS — G4733 Obstructive sleep apnea (adult) (pediatric): Secondary | ICD-10-CM | POA: Diagnosis not present

## 2021-03-11 DIAGNOSIS — D6869 Other thrombophilia: Secondary | ICD-10-CM | POA: Diagnosis not present

## 2021-03-11 DIAGNOSIS — E669 Obesity, unspecified: Secondary | ICD-10-CM | POA: Diagnosis not present

## 2021-03-11 DIAGNOSIS — Z79899 Other long term (current) drug therapy: Secondary | ICD-10-CM | POA: Diagnosis not present

## 2021-03-11 DIAGNOSIS — Z6837 Body mass index (BMI) 37.0-37.9, adult: Secondary | ICD-10-CM | POA: Diagnosis not present

## 2021-03-11 DIAGNOSIS — Z7901 Long term (current) use of anticoagulants: Secondary | ICD-10-CM | POA: Diagnosis not present

## 2021-03-11 DIAGNOSIS — I480 Paroxysmal atrial fibrillation: Secondary | ICD-10-CM | POA: Diagnosis not present

## 2021-03-11 DIAGNOSIS — I10 Essential (primary) hypertension: Secondary | ICD-10-CM | POA: Diagnosis not present

## 2021-03-11 DIAGNOSIS — I483 Typical atrial flutter: Secondary | ICD-10-CM

## 2021-03-11 DIAGNOSIS — Z9989 Dependence on other enabling machines and devices: Secondary | ICD-10-CM | POA: Diagnosis not present

## 2021-03-11 DIAGNOSIS — I4892 Unspecified atrial flutter: Secondary | ICD-10-CM | POA: Insufficient documentation

## 2021-03-11 LAB — BASIC METABOLIC PANEL
Anion gap: 10 (ref 5–15)
BUN: 15 mg/dL (ref 8–23)
CO2: 22 mmol/L (ref 22–32)
Calcium: 9.3 mg/dL (ref 8.9–10.3)
Chloride: 108 mmol/L (ref 98–111)
Creatinine, Ser: 1.14 mg/dL — ABNORMAL HIGH (ref 0.44–1.00)
GFR, Estimated: 52 mL/min — ABNORMAL LOW (ref >60)
Glucose, Bld: 119 mg/dL — ABNORMAL HIGH (ref 70–99)
Potassium: 4.2 mmol/L (ref 3.5–5.1)
Sodium: 140 mmol/L (ref 135–145)

## 2021-03-11 LAB — CBC
HCT: 40.6 % (ref 36.0–46.0)
Hemoglobin: 12.8 g/dL (ref 12.0–15.0)
MCH: 27.9 pg (ref 26.0–34.0)
MCHC: 31.5 g/dL (ref 30.0–36.0)
MCV: 88.6 fL (ref 80.0–100.0)
Platelets: 247 10*3/uL (ref 150–400)
RBC: 4.58 MIL/uL (ref 3.87–5.11)
RDW: 16.5 % — ABNORMAL HIGH (ref 11.5–15.5)
WBC: 8 10*3/uL (ref 4.0–10.5)
nRBC: 0 % (ref 0.0–0.2)

## 2021-03-11 LAB — TSH: TSH: 0.636 u[IU]/mL (ref 0.350–4.500)

## 2021-03-11 NOTE — Patient Instructions (Signed)
Increase metoprolol to 50mg  twice a day until day of cardioversion then reduce to normal dosing  Cardioversion scheduled for Friday, February 10th  - Arrive at the February 12 and go to admitting at [facility] not eat or drink anything after midnight the night prior to your procedure.  - Take all your morning medication (except diabetic medications) with a sip of water prior to arrival.  - You will not be able to drive home after your procedure.  - Do NOT miss any doses of your blood thinner - if you should miss a dose please notify our office immediately.  - If you feel as if you go back into normal rhythm prior to scheduled cardioversion, please notify our office immediately. If your procedure is canceled in the cardioversion suite you will be charged a cancellation fee.

## 2021-03-11 NOTE — Progress Notes (Addendum)
Primary Care Physician: Everrett Coombe, DO Primary Cardiologist: Dr Katrinka Blazing Primary Electrophysiologist: Dr [name] Referring Physician: Dr [name]  Sleep medicine: Dr Franchot Gallo is a 71 y.o. female with a history of OSA, HTN, atrial flutter, atrial fibrillation who presents for consultation in the [facility]. She was diagnosed with atrial fibrillation on 07/18/16. She had 2 episodes of chest pressure with radiation to her arm in late May or early June. It lasted 20 minutes. EMS was called but she felt better. 10 days later, she had similar symptoms.  Went to the emergency room where she had a wide-complex tachycardia.  While in the emergency room, her rate slowed and showed a likely atrial flutter.  It was thought that this was the same rhythm with the wide-complex tachycardia being apparent 1-1 conduction.  She reverted to sinus rhythm with IV medications. She is now s/p afib/flutter ablation 11/09/17. Patient is on Eliquis for a CHADS2VASC score of 3.   On follow up today, patient reports that she had some brief palpitations last week however, she has had persistent heart racing since 03/07/21. She has noted some orthopnea but no lower extremity edema. She did drink a drink high in sodium before the onset of her symptoms but otherwise there was no specific trigger for her episode.   Today, she denies symptoms of chest pain, shortness of breath, PND, lower extremity edema, dizziness, presyncope, syncope, snoring, daytime somnolence, bleeding, or neurologic sequela. The patient is tolerating medications without difficulties and is otherwise without complaint today.    Atrial Fibrillation Risk Factors:  she does have symptoms or diagnosis of sleep apnea. she is compliant with CPAP therapy. she does not have a history of rheumatic fever.   she has a BMI of Body mass index is 37.04 kg/m.Marland Kitchen Filed Weights   03/11/21 1542  Weight: 101 kg    Family  History  Problem Relation Age of Onset   CAD Father    AAA (abdominal aortic aneurysm) Sister    Thyroid disease Sister    Allergies Other    Clotting disorder Other    Hypertension Other    Heart disease Other    Rheum arthritis Other    Migraines Other      Atrial Fibrillation Management history:  Previous antiarrhythmic drugs: flecainide, amiodarone  Previous cardioversions: none Previous ablations: 11/09/17 afib and flutter CHADS2VASC score: 3 Anticoagulation history: Eliquis   Past Medical History:  Diagnosis Date   Anxiety    Atrial fibrillation (HCC)    Complication of anesthesia    "woke up during colonoscopy 02/2017" (11/09/2017)   GERD (gastroesophageal reflux disease)    H/O atrial flutter    History of blood transfusion 02/2017   LGIB (11/09/2017)   Hypertension    Migraine    "a few/year" (11/09/2017)   OSA on CPAP    Past Surgical History:  Procedure Laterality Date   ATRIAL FIBRILLATION ABLATION N/A 11/09/2017   Procedure: ATRIAL FIBRILLATION ABLATION;  Surgeon: Regan Lemming, MD;  Location: [facility] INVASIVE CV LAB;  Service: Cardiovascular;  Laterality: N/A;   COLONOSCOPY N/A 02/24/2017   Procedure: COLONOSCOPY;  Surgeon: Jeani Hawking, MD;  Location: [facility] ENDOSCOPY;  Service: Gastroenterology;  Laterality: N/A;   ENTEROSCOPY N/A 02/24/2017   Procedure: ENTEROSCOPY;  Surgeon: Jeani Hawking, MD;  Location: [facility] ENDOSCOPY;  Service: Gastroenterology;  Laterality: N/A;   HERNIA REPAIR  1960s   WISDOM TOOTH EXTRACTION      Current Outpatient Medications  Medication Sig Dispense Refill   diltiazem (CARDIZEM CD) 240 MG 24 hr capsule TAKE 1 CAPSULE BY MOUTH DAILY. PT NEEDS TO KEEP UPCOMING APPT IN FEB FOR FURTHER REFILLS 30 capsule 5   ELIQUIS 5 MG TABS tablet TAKE 1 TABLET BY MOUTH TWICE A DAY 60 tablet 5   furosemide (LASIX) 20 MG tablet TAKE 1 TABLET BY MOUTH EVERY DAY AS NEEDED FOR SWELLING 90 tablet 3   losartan (COZAAR) 50 MG tablet TAKE 1 TABLET BY MOUTH  EVERY DAY 90 tablet 2   metoprolol succinate (TOPROL-XL) 50 MG 24 hr tablet TAKE 1 TABLET BY MOUTH DAILY. TAKE WITH OR IMMEDIATELY FOLLOWING A MEAL. 90 tablet 2   Multiple Vitamin (MULTIVITAMIN) LIQD Take 30 mLs by mouth daily.     omeprazole (PRILOSEC) 40 MG capsule Take 1 capsule (40 mg total) by mouth daily. 90 capsule 3   PRESCRIPTION MEDICATION Inhale into the lungs at bedtime. CPAP     COVID-19 mRNA bivalent vaccine, Pfizer, (PFIZER COVID-19 VAC BIVALENT) injection Inject into the muscle. 0.3 mL 0   COVID-19 mRNA Vac-TriS, Pfizer, (PFIZER-BIONT COVID-19 VAC-TRIS) SUSP injection Inject into the muscle. 0.3 mL 0   No current facility-administered medications for this encounter.    Allergies  Allergen Reactions   Diphenhydramine Hcl Hives and Other (See Comments)    Erratic heartbeat   Flecainide Rash    Social History   Socioeconomic History   Marital status: Married    Spouse name: Not on file   Number of children: Not on file   Years of education: Not on file   Highest education level: Not on file  Occupational History   Not on file  Tobacco Use   Smoking status: Former    Packs/day: 0.25    Years: 10.00    Pack years: 2.50    Types: Cigarettes   Smokeless tobacco: Never   Tobacco comments:    11/09/2017 "quit smoking in the 1980s"  Vaping Use   Vaping Use: Never used  Substance and Sexual Activity   Alcohol use: Never   Drug use: Never   Sexual activity: Not Currently  Other Topics Concern   Not on file  Social History Narrative   Not on file   Social Determinants of Health   Financial Resource Strain: Not on file  Food Insecurity: Not on file  Transportation Needs: Not on file  Physical Activity: Not on file  Stress: Not on file  Social Connections: Not on file  Intimate Partner Violence: Not on file     ROS- All systems are reviewed and negative except as per the HPI above.  Physical Exam: Vitals:   03/11/21 1542  BP: 138/88  Pulse: (!) 153   Weight: 101 kg  Height: 5\' 5"  (1.651 m)    GEN- The patient is a well appearing obese female, alert and oriented x 3 today.   Head- normocephalic, atraumatic Eyes-  Sclera clear, conjunctiva pink Ears- hearing intact Oropharynx- clear Neck- supple  Lungs- Clear to ausculation bilaterally, normal work of breathing Heart- Regular rate and rhythm, tachycardia, no murmurs, rubs or gallops  GI- soft, NT, ND, + BS Extremities- no clubbing, cyanosis, or edema MS- no significant deformity or atrophy Skin- no rash or lesion Psych- euthymic mood, full affect Neuro- strength and sensation are intact  Wt Readings from Last 3 Encounters:  03/11/21 101 kg  05/19/20 100.7 kg  04/10/20 103.9 kg    EKG today demonstrates  Typical atrial flutter with 2:1 block  Vent. rate 153 BPM PR interval 120 ms QRS duration 96 ms QT/QTcB 234/373 ms  Echo 09/18/17 demonstrated  - Left ventricle: The cavity size was normal. Wall thickness was    increased in a pattern of mild LVH. Systolic function was normal.    The estimated ejection fraction was in the range of 55% to 60%.    Wall motion was normal; there were no regional wall motion    abnormalities. Doppler parameters are consistent with restrictive    physiology, indicative of decreased left ventricular diastolic    compliance and/or increased left atrial pressure.  - Mitral valve: Calcified annulus. There was moderate    regurgitation.  - Left atrium: The atrium was severely dilated.  - Atrial septum: The septum bowed from left to right, consistent    with increased left atrial pressure.  - Pulmonary arteries: Systolic pressure was moderately increased.    PA peak pressure: 52 mm Hg (S).   Impressions:   - Normal LV function; mild LVH; restrictive filling; moderate MR;    severe LAE; mild TR with moderate pulmonary hypertension.  Epic records are reviewed at length today  CHA2DS2-VASc Score = 3  The patient's score is based upon: CHF  History: 0 HTN History: 1 Diabetes History: 0 Stroke History: 0 Vascular Disease History: 0 Age Score: 1 Gender Score: 1       ASSESSMENT AND PLAN: 1. Paroxysmal Atrial Fibrillation/atrial flutter The patient's CHA2DS2-VASc score is 3, indicating a 3.2% annual risk of stroke.   Patient in rapid atrial flutter today, appears persistent now. We discussed rhythm control options. Will arrange for DCCV. Long term, she may need a repeat ablation.  Continue diltiazem 240 mg daily Increase Toprol to 50 mg BID until DCCV.  Continue Eliquis 5 mg BID. Patient denies any missed doses of anticoagulation in the last 3 weeks.  Check bmet/cbc/TSH  2. Secondary Hypercoagulable State (ICD10:  D68.69) The patient is at significant risk for stroke/thromboembolism based upon her CHA2DS2-VASc Score of 3.  Continue Apixaban (Eliquis).   3. Obesity Body mass index is 37.04 kg/m. Lifestyle modification was discussed at length including regular exercise and weight reduction.  4. Obstructive sleep apnea Patient reports compliance with CPAP therapy. Followed by Dr Radford Pax.  5. HTN Stable, med changes as above.    Follow up in the AF clinic post DCCV.    [facility] [address] [place], [place] [address] [phone] 03/11/2021 3:49 PM

## 2021-03-12 ENCOUNTER — Emergency Department (HOSPITAL_COMMUNITY)
Admission: EM | Admit: 2021-03-12 | Discharge: 2021-03-12 | Disposition: A | Discharge status: Home / Self Care | Payer: Medicare Other | Attending: Student | Admitting: Student

## 2021-03-12 ENCOUNTER — Other Ambulatory Visit: Payer: Self-pay

## 2021-03-12 ENCOUNTER — Encounter (HOSPITAL_COMMUNITY): Payer: Self-pay | Admitting: Cardiology

## 2021-03-12 DIAGNOSIS — Z79899 Other long term (current) drug therapy: Secondary | ICD-10-CM | POA: Insufficient documentation

## 2021-03-12 DIAGNOSIS — Z7901 Long term (current) use of anticoagulants: Secondary | ICD-10-CM | POA: Diagnosis not present

## 2021-03-12 DIAGNOSIS — I10 Essential (primary) hypertension: Secondary | ICD-10-CM | POA: Insufficient documentation

## 2021-03-12 DIAGNOSIS — Z87891 Personal history of nicotine dependence: Secondary | ICD-10-CM | POA: Insufficient documentation

## 2021-03-12 DIAGNOSIS — I4892 Unspecified atrial flutter: Secondary | ICD-10-CM | POA: Diagnosis not present

## 2021-03-12 DIAGNOSIS — I483 Typical atrial flutter: Secondary | ICD-10-CM

## 2021-03-12 DIAGNOSIS — R000 Tachycardia, unspecified: Secondary | ICD-10-CM | POA: Diagnosis present

## 2021-03-12 LAB — COMPREHENSIVE METABOLIC PANEL
ALT: 40 U/L (ref 0–44)
AST: 43 U/L — ABNORMAL HIGH (ref 15–41)
Albumin: 3.5 g/dL (ref 3.5–5.0)
Alkaline Phosphatase: 71 U/L (ref 38–126)
Anion gap: 9 (ref 5–15)
BUN: 17 mg/dL (ref 8–23)
CO2: 22 mmol/L (ref 22–32)
Calcium: 9.2 mg/dL (ref 8.9–10.3)
Chloride: 107 mmol/L (ref 98–111)
Creatinine, Ser: 1.27 mg/dL — ABNORMAL HIGH (ref 0.44–1.00)
GFR, Estimated: 45 mL/min — ABNORMAL LOW (ref >60)
Glucose, Bld: 114 mg/dL — ABNORMAL HIGH (ref 70–99)
Potassium: 5 mmol/L (ref 3.5–5.1)
Sodium: 138 mmol/L (ref 135–145)
Total Bilirubin: 1.3 mg/dL — ABNORMAL HIGH (ref 0.3–1.2)
Total Protein: 7.2 g/dL (ref 6.5–8.1)

## 2021-03-12 LAB — CBC WITH DIFFERENTIAL/PLATELET
Abs Immature Granulocytes: 0.05 10*3/uL (ref 0.00–0.07)
Basophils Absolute: 0 10*3/uL (ref 0.0–0.1)
Basophils Relative: 0 %
Eosinophils Absolute: 0.2 10*3/uL (ref 0.0–0.5)
Eosinophils Relative: 1 %
HCT: 40.9 % (ref 36.0–46.0)
Hemoglobin: 13 g/dL (ref 12.0–15.0)
Immature Granulocytes: 0 %
Lymphocytes Relative: 28 %
Lymphs Abs: 3.4 10*3/uL (ref 0.7–4.0)
MCH: 28.1 pg (ref 26.0–34.0)
MCHC: 31.8 g/dL (ref 30.0–36.0)
MCV: 88.3 fL (ref 80.0–100.0)
Monocytes Absolute: 1.2 10*3/uL — ABNORMAL HIGH (ref 0.1–1.0)
Monocytes Relative: 10 %
Neutro Abs: 7.2 10*3/uL (ref 1.7–7.7)
Neutrophils Relative %: 61 %
Platelets: 238 10*3/uL (ref 150–400)
RBC: 4.63 MIL/uL (ref 3.87–5.11)
RDW: 16.9 % — ABNORMAL HIGH (ref 11.5–15.5)
WBC: 12.1 10*3/uL — ABNORMAL HIGH (ref 4.0–10.5)
nRBC: 0 % (ref 0.0–0.2)

## 2021-03-12 LAB — I-STAT CHEM 8, ED
BUN: 24 mg/dL — ABNORMAL HIGH (ref 8–23)
Calcium, Ion: 1.1 mmol/L — ABNORMAL LOW (ref 1.15–1.40)
Chloride: 108 mmol/L (ref 98–111)
Creatinine, Ser: 1.1 mg/dL — ABNORMAL HIGH (ref 0.44–1.00)
Glucose, Bld: 115 mg/dL — ABNORMAL HIGH (ref 70–99)
HCT: 42 % (ref 36.0–46.0)
Hemoglobin: 14.3 g/dL (ref 12.0–15.0)
Potassium: 4.9 mmol/L (ref 3.5–5.1)
Sodium: 140 mmol/L (ref 135–145)
TCO2: 26 mmol/L (ref 22–32)

## 2021-03-12 MED ORDER — PROPOFOL 10 MG/ML IV BOLUS
INTRAVENOUS | Status: AC | PRN
Start: 2021-03-12 — End: 2021-03-12
  Administered 2021-03-12: 80 mg via INTRAVENOUS

## 2021-03-12 MED ORDER — LACTATED RINGERS IV BOLUS
1000.0000 mL | Freq: Once | INTRAVENOUS | Status: AC
  Administered 2021-03-12: 1000 mL via INTRAVENOUS

## 2021-03-12 NOTE — Sedation Documentation (Signed)
Converted to NSR. Repeat EKG done. 

## 2021-03-12 NOTE — ED Provider Notes (Addendum)
[facility] EMERGENCY DEPARTMENT Provider Note  CSN: [id number] Arrival date & time: 03/12/21 [id number]  Chief Complaint(s) Tachycardia  HPI Mary Poole is a 71 y.o. female with PMH paroxysmal A. fib/a flutter status post multiple cardioversions and ablation who presents emergency department for evaluation of chest pain, shortness of breath and tachycardia.  Apparently, the patient was seen in A. fib clinic yesterday and found to be a flutter 2-1 with rates in the 150s but was sent home with instructions to increase her metoprolol and scheduled for cardioversion in 10 days.  The patient presents emergency department today with persistent shortness of breath and persistent tachycardia.  She denies abdominal pain, nausea, vomiting, headache, fever or other systemic symptoms.  HPI  Past Medical History Past Medical History:  Diagnosis Date   Anxiety    Atrial fibrillation ([place])    Complication of anesthesia    "woke up during colonoscopy 02/2017" (11/09/2017)   GERD (gastroesophageal reflux disease)    H/O atrial flutter    History of blood transfusion 02/2017   LGIB (11/09/2017)   Hypertension    Migraine    "a few/year" (11/09/2017)   OSA on CPAP    Patient Active Problem List   Diagnosis Date Noted   Secondary hypercoagulable state ([place]) 03/11/2021   Groin strain 04/10/2020   Adjustment disorder with anxiety 10/26/2018   Morbidly obese ([place]) 12/01/2017   Persistent atrial fibrillation 11/09/2017   Atrial fibrillation with RVR ([place]) 10/30/2017   Atrial flutter with rapid ventricular response ([place]) 10/15/2017   Sinus bradycardia 09/18/2017   OSA (obstructive sleep apnea)    Thyroid disease 07/20/2017   Atrial flutter ([place]) 06/09/2017   Iron deficiency anemia due to chronic blood loss    Normocytic anemia 02/22/2017   Accessory atrioventricular pathway 09/17/2016   On continuous oral anticoagulation 09/17/2016   Snoring 09/17/2016   Paroxysmal atrial  fibrillation ([place]) 09/16/2016   Essential hypertension 07/19/2016   Sensorineural hearing loss (SNHL), bilateral 11/20/2015   Laryngopharyngeal reflux (LPR) 11/20/2015   Perennial allergic rhinitis 11/20/2015   Tinnitus, bilateral 11/20/2015   GERD (gastroesophageal reflux disease) 07/15/2011   Home Medication(s) Prior to Admission medications   Medication Sig Start Date End Date Taking? Authorizing Provider  PRESCRIPTION MEDICATION Inhale into the lungs at bedtime. CPAP   Yes [provider]  diltiazem (CARDIZEM CD) 240 MG 24 hr capsule TAKE 1 CAPSULE BY MOUTH DAILY. PT NEEDS TO KEEP UPCOMING APPT IN FEB FOR FURTHER REFILLS 01/15/21   Camnitz, Ocie Doyne, MD  ELIQUIS 5 MG TABS tablet TAKE 1 TABLET BY MOUTH TWICE A DAY 06/16/20   Camnitz, Will Hassell Done, MD  furosemide (LASIX) 20 MG tablet TAKE 1 TABLET BY MOUTH EVERY DAY AS NEEDED FOR SWELLING 08/13/20   Camnitz, Will Hassell Done, MD  losartan (COZAAR) 50 MG tablet TAKE 1 TABLET BY MOUTH EVERY DAY 10/17/20   Camnitz, Ocie Doyne, MD  metoprolol succinate (TOPROL-XL) 50 MG 24 hr tablet TAKE 1 TABLET BY MOUTH DAILY. TAKE WITH OR IMMEDIATELY FOLLOWING A MEAL. 08/18/20   Camnitz, Ocie Doyne, MD  Multiple Vitamin (MULTIVITAMIN) LIQD Take 30 mLs by mouth daily.    [provider]  omeprazole (PRILOSEC) 40 MG capsule Take 1 capsule (40 mg total) by mouth daily. 04/10/20   Luetta Nutting, DO  Past Surgical History Past Surgical History:  Procedure Laterality Date   ATRIAL FIBRILLATION ABLATION N/A 11/09/2017   Procedure: ATRIAL FIBRILLATION ABLATION;  Surgeon: Constance Haw, MD;  Location: [facility];  Service: Cardiovascular;  Laterality: N/A;   COLONOSCOPY N/A 02/24/2017   Procedure: COLONOSCOPY;  Surgeon: Carol Ada, MD;  Location: [facility];  Service: Gastroenterology;  Laterality: N/A;    ENTEROSCOPY N/A 02/24/2017   Procedure: ENTEROSCOPY;  Surgeon: Carol Ada, MD;  Location: [facility];  Service: Gastroenterology;  Laterality: N/A;   HERNIA REPAIR  1960s   WISDOM TOOTH EXTRACTION     Family History Family History  Problem Relation Age of Onset   CAD Father    AAA (abdominal aortic aneurysm) Sister    Thyroid disease Sister    Allergies Other    Clotting disorder Other    Hypertension Other    Heart disease Other    Rheum arthritis Other    Migraines Other     Social History Social History   Tobacco Use   Smoking status: Former    Packs/day: 0.25    Years: 10.00    Pack years: 2.50    Types: Cigarettes   Smokeless tobacco: Never   Tobacco comments:    11/09/2017 "quit smoking in the 1980s"  Vaping Use   Vaping Use: Never used  Substance Use Topics   Alcohol use: Never   Drug use: Never   Allergies Diphenhydramine hcl and Flecainide  Review of Systems Review of Systems  Respiratory:  Positive for shortness of breath.   Cardiovascular:  Positive for chest pain.   Physical Exam Vital Signs  I have reviewed the triage vital signs BP 116/89    Pulse 66    Temp (!) 97.5 F (36.4 C) (Oral)    Resp (!) 23    SpO2 98%   Physical Exam Vitals and nursing note reviewed.  Constitutional:      General: She is not in acute distress.    Appearance: She is well-developed.  HENT:     Head: Normocephalic and atraumatic.  Eyes:     Conjunctiva/sclera: Conjunctivae normal.  Cardiovascular:     Rate and Rhythm: Regular rhythm. Tachycardia present.     Heart sounds: No murmur heard. Pulmonary:     Effort: Pulmonary effort is normal. No respiratory distress.     Breath sounds: Normal breath sounds.  Abdominal:     Palpations: Abdomen is soft.     Tenderness: There is no abdominal tenderness.  Musculoskeletal:        General: No swelling.     Cervical back: Neck supple.  Skin:    General: Skin is warm and dry.     Capillary Refill: Capillary refill  takes less than 2 seconds.  Neurological:     Mental Status: She is alert.  Psychiatric:        Mood and Affect: Mood normal.    ED Results and Treatments Labs (all labs ordered are listed, but only abnormal results are displayed) Labs Reviewed  COMPREHENSIVE METABOLIC PANEL - Abnormal; Notable for the following components:      Result Value   Glucose, Bld 114 (*)    Creatinine, Ser 1.27 (*)    AST 43 (*)    Total Bilirubin 1.3 (*)    GFR, Estimated 45 (*)    All other components within normal limits  CBC WITH DIFFERENTIAL/PLATELET - Abnormal; Notable for the following components:   WBC 12.1 (*)  RDW 16.9 (*)    Monocytes Absolute 1.2 (*)    All other components within normal limits  I-STAT CHEM 8, ED - Abnormal; Notable for the following components:   BUN 24 (*)    Creatinine, Ser 1.10 (*)    Glucose, Bld 115 (*)    Calcium, Ion 1.10 (*)    All other components within normal limits                                                                                                                          Radiology No results found.  Pertinent labs & imaging results that were available during my care of the patient were reviewed by me and considered in my medical decision making (see MDM for details).  Medications Ordered in ED Medications  lactated ringers bolus 1,000 mL (1,000 mLs Intravenous New Bag/Given 03/12/21 0830)  propofol (DIPRIVAN) 10 mg/mL bolus/IV push (80 mg Intravenous Given 03/12/21 0856)                                                                                                                                     Procedures .Sedation  Date/Time: 03/12/2021 9:28 AM Performed by: Teressa Lower, MD Authorized by: Teressa Lower, MD   Consent:    Consent obtained:  Written   Consent given by:  Patient   Risks discussed:  Allergic reaction, prolonged hypoxia resulting in organ damage, dysrhythmia, inadequate sedation, respiratory compromise  necessitating ventilatory assistance and intubation, prolonged sedation necessitating reversal, nausea and vomiting   Alternatives discussed:  Anxiolysis and analgesia without sedation Universal protocol:    Immediately prior to procedure, a time out was called: yes   Pre-sedation assessment:    Time since last food or drink:  0000   ASA classification: class 3 - patient with severe systemic disease     Mallampati score:  II - soft palate, uvula, fauces visible   Pre-sedation assessments completed and reviewed: airway patency, cardiovascular function, hydration status, mental status, nausea/vomiting, respiratory function and temperature   Immediate pre-procedure details:    Reviewed: vital signs and relevant labs/tests     Verified: bag valve mask available, intubation equipment available, oxygen available and suction available   Procedure details (see MAR for exact dosages):    Preoxygenation:  Nasal cannula   Sedation:  Propofol   Intended level of sedation: deep   Intra-procedure monitoring:  Blood pressure monitoring, frequent LOC assessments, cardiac monitor, continuous pulse oximetry and frequent vital sign checks   Intra-procedure events: none     Intra-procedure management:  Airway repositioning   Total Provider sedation time (minutes):  15 Post-procedure details:    Post-sedation assessments completed and reviewed: airway patency, cardiovascular function, mental status, nausea/vomiting, pain level and respiratory function     Patient is stable for discharge or admission: yes     Procedure completion:  Tolerated well, no immediate complications .Cardioversion  Date/Time: 03/12/2021 9:29 AM Performed by: Teressa Lower, MD Authorized by: Teressa Lower, MD   Consent:    Consent obtained:  Verbal   Consent given by:  Patient   Risks discussed:  Cutaneous burn, death, pain and induced arrhythmia   Alternatives discussed:  Rate-control medication Pre-procedure details:     Cardioversion basis:  Elective   Rhythm:  Atrial flutter   Electrode placement:  Anterior-posterior Patient sedated: Yes. Refer to sedation procedure documentation for details of sedation.  Attempt one:    Cardioversion mode:  Synchronous   Shock (Joules):  200   Shock outcome:  Conversion to normal sinus rhythm Post-procedure details:    Patient status:  Awake   Patient tolerance of procedure:  Tolerated well, no immediate complications .Critical Care Performed by: Teressa Lower, MD Authorized by: Teressa Lower, MD   Critical care provider statement:    Critical care time (minutes):  30   Critical care was necessary to treat or prevent imminent or life-threatening deterioration of the following conditions:  Cardiac failure   Critical care was time spent personally by me on the following activities:  Development of treatment plan with patient or surrogate, discussions with consultants, evaluation of patient's response to treatment, examination of patient, ordering and review of laboratory studies, ordering and review of radiographic studies, ordering and performing treatments and interventions, pulse oximetry, re-evaluation of patient's condition and review of old charts  (including critical care time)  Medical Decision Making / ED Course   This patient presents to the ED for concern of tachycardia and shortness of breath, this involves an extensive number of treatment options, and is a complaint that carries with it a high risk of complications and morbidity.  The differential diagnosis includes a flutter, A. fib, electrolyte abnormality, ischemia  MDM: Patient seen emergency department for evaluation of rapid heart rate and shortness of breath.  Physical exam reveals a regular tachycardia but is otherwise unremarkable.  ECG with a flutter 2-1 consistent with her rates in the 150s as seen yesterday.  The patient has been compliant with her Eliquis and symptoms began within her  period of appropriate anticoagulation.  I consulted cardiology who agreed with emergency department cardioversion.  Laboratory evaluation with normal electrolytes, mild leukocytosis to 12.1, creatinine 1.27.  Procedural sedation performed with propofol and patient cardioverted at bedside with 200 J synchronized cardioversion and successful return to normal sinus rhythm.  On reevaluation, patient with no focal neurologic deficits and persistent normal sinus rhythm.  She was instructed to continue her current antiarrhythmic regimen and follow-up closely in the outpatient cardiology setting.  Patient then discharged.   Additional history obtained:  -External records from outside source obtained and reviewed including: Chart review including previous notes, labs, imaging, consultation notes   Lab Tests: -I ordered, reviewed, and interpreted labs.   The pertinent results include:   Labs Reviewed  COMPREHENSIVE METABOLIC PANEL - Abnormal; Notable for the following components:      Result Value  Glucose, Bld 114 (*)    Creatinine, Ser 1.27 (*)    AST 43 (*)    Total Bilirubin 1.3 (*)    GFR, Estimated 45 (*)    All other components within normal limits  CBC WITH DIFFERENTIAL/PLATELET - Abnormal; Notable for the following components:   WBC 12.1 (*)    RDW 16.9 (*)    Monocytes Absolute 1.2 (*)    All other components within normal limits  I-STAT CHEM 8, ED - Abnormal; Notable for the following components:   BUN 24 (*)    Creatinine, Ser 1.10 (*)    Glucose, Bld 115 (*)    Calcium, Ion 1.10 (*)    All other components within normal limits      EKG   EKG Interpretation  Date/Time:  Thursday March 12 2021 08:07:28 EST Ventricular Rate:  150 PR Interval:  112 QRS Duration: 98 QT Interval:  240 QTC Calculation: 379 R Axis:   59 Text Interpretation: Aflutter 2:1 When compared with ECG of 11-Mar-2021 15:46, PREVIOUS ECG IS PRESENT Confirmed by Dani Danis ([id number]) on 03/12/2021  9:25:20 AM         Medicines ordered and prescription drug management: Meds ordered this encounter  Medications   lactated ringers bolus 1,000 mL   propofol (DIPRIVAN) 10 mg/mL bolus/IV push    -I have reviewed the patients home medicines and have made adjustments as needed  Critical interventions Cardioversion, procedural sedation, fluid resuscitation   Consultations Obtained: I requested consultation with the cardiologist Dr. Gardiner Rhyme,  and discussed lab and imaging findings as well as pertinent plan - they recommend: Emergency room cardioversion   Cardiac Monitoring: The patient was maintained on a cardiac monitor.  I personally viewed and interpreted the cardiac monitored which showed an underlying rhythm of: A flutter 2-1, normal sinus rhythm  Social Determinants of Health:  Factors impacting patients care include: none   Reevaluation: After the interventions noted above, I reevaluated the patient and found that they have :improved  Co morbidities that complicate the patient evaluation  Past Medical History:  Diagnosis Date   Anxiety    Atrial fibrillation ([place])    Complication of anesthesia    "woke up during colonoscopy 02/2017" (11/09/2017)   GERD (gastroesophageal reflux disease)    H/O atrial flutter    History of blood transfusion 02/2017   LGIB (11/09/2017)   Hypertension    Migraine    "a few/year" (11/09/2017)   OSA on CPAP       Dispostion: I considered admission for this patient, but patient returned to normal sinus rhythm after bedside cardioversion and is safe for outpatient cardiology follow-up.     Final Clinical Impression(s) / ED Diagnoses Final diagnoses:  None     @PCDICTATION @    Teressa Lower, MD 03/12/21 0932    Teressa Lower, MD 03/12/21 [phone]

## 2021-03-12 NOTE — Sedation Documentation (Addendum)
Propofol 80mg  given by Dr.Kommor at this time.

## 2021-03-12 NOTE — Sedation Documentation (Signed)
Cardioverted at 200J by Dr.Kommor at this time.

## 2021-03-12 NOTE — ED Notes (Signed)
Consent signed by pt witnessed by this RN for cardioversion with sedation procedure.

## 2021-03-12 NOTE — ED Notes (Signed)
Pt is now awake, alert and oriented x 4.  

## 2021-03-12 NOTE — ED Triage Notes (Signed)
Pt here POV d/t Afib RVR. Pt seen at the A-fib clinic 03/11/21 with HR 150. Told to double medication and sent home. Pt arrives with heart rate 150. Endorses SOB

## 2021-03-12 NOTE — ED Notes (Signed)
Zoll pads placed on pt, crash cart in the room. RT notified of plan to cardiovert pt. Suction prepared at bedside and ambubag ready. Will continue to monitor.

## 2021-03-12 NOTE — ED Notes (Signed)
Pt is now talking to her daughter on the phone. Pt has been awake, alert and oriented x 4. Will continue to monitor.

## 2021-03-13 ENCOUNTER — Telehealth: Payer: Self-pay | Admitting: General Practice

## 2021-03-13 NOTE — Telephone Encounter (Signed)
Transition Care Management Follow-up Telephone Call Date of discharge and from where: 03/12/21 from [facility] How have you been since you were released from the hospital? Doing better. Any questions or concerns? No  Items Reviewed: Did the pt receive and understand the discharge instructions provided? Yes  Medications obtained and verified? No  Other? No  Any new allergies since your discharge? No  Dietary orders reviewed? Yes Do you have support at home? Yes   Home Care and Equipment/Supplies: Were home health services ordered? no  Functional Questionnaire: (I = Independent and D = Dependent) ADLs: I  Bathing/Dressing- I  Meal Prep- I  Eating- I  Maintaining continence- I  Transferring/Ambulation- I  Managing Meds- I  Follow up appointments reviewed:  PCP Hospital f/u appt confirmed? No   Specialist Hospital f/u appt confirmed? Yes  Scheduled to see the Cardiologist on 03/20/21. Are transportation arrangements needed? No  If their condition worsens, is the pt aware to call PCP or go to the Emergency Dept.? Yes Was the patient provided with contact information for the PCP's office or ED? Yes Was to pt encouraged to call back with questions or concerns? Yes

## 2021-03-17 ENCOUNTER — Other Ambulatory Visit (HOSPITAL_COMMUNITY): Payer: Self-pay | Admitting: *Deleted

## 2021-03-17 MED ORDER — METOPROLOL SUCCINATE ER 50 MG PO TB24
50.0000 mg | ORAL_TABLET | Freq: Two times a day (BID) | ORAL | 3 refills | Status: DC
Start: 2021-03-17 — End: 2021-04-09

## 2021-03-17 MED ORDER — DILTIAZEM HCL ER COATED BEADS 240 MG PO CP24: 240.0000 mg | ORAL_CAPSULE | Freq: Every morning | ORAL | 3 refills | Status: DC

## 2021-03-20 ENCOUNTER — Ambulatory Visit (HOSPITAL_COMMUNITY): Admit: 2021-03-20 | Payer: Medicare Other | Admitting: Cardiology

## 2021-03-20 ENCOUNTER — Encounter (HOSPITAL_COMMUNITY): Payer: Self-pay

## 2021-03-20 SURGERY — CARDIOVERSION
Anesthesia: General

## 2021-03-24 ENCOUNTER — Other Ambulatory Visit (HOSPITAL_BASED_OUTPATIENT_CLINIC_OR_DEPARTMENT_OTHER): Payer: Self-pay

## 2021-03-24 MED ORDER — ZOSTER VAC RECOMB ADJUVANTED 50 MCG/0.5ML IM SUSR
INTRAMUSCULAR | 0 refills | Status: DC
  Filled 2021-03-24: qty 0.5, 1d supply, fill #0

## 2021-04-02 ENCOUNTER — Ambulatory Visit (HOSPITAL_COMMUNITY)
Admission: RE | Admit: 2021-04-02 | Discharge: 2021-04-02 | Disposition: A | Discharge status: Home / Self Care | Payer: Medicare Other | Source: Ambulatory Visit | Attending: Physician Assistant | Admitting: Physician Assistant

## 2021-04-02 ENCOUNTER — Encounter (HOSPITAL_COMMUNITY): Payer: Self-pay | Admitting: Physician Assistant

## 2021-04-02 ENCOUNTER — Other Ambulatory Visit: Payer: Self-pay

## 2021-04-02 VITALS — BP 138/100 | HR 150 | Ht 65.0 in | Wt 222.6 lb

## 2021-04-02 DIAGNOSIS — E669 Obesity, unspecified: Secondary | ICD-10-CM | POA: Insufficient documentation

## 2021-04-02 DIAGNOSIS — I10 Essential (primary) hypertension: Secondary | ICD-10-CM | POA: Insufficient documentation

## 2021-04-02 DIAGNOSIS — G4733 Obstructive sleep apnea (adult) (pediatric): Secondary | ICD-10-CM | POA: Insufficient documentation

## 2021-04-02 DIAGNOSIS — D6869 Other thrombophilia: Secondary | ICD-10-CM | POA: Diagnosis not present

## 2021-04-02 DIAGNOSIS — Z9989 Dependence on other enabling machines and devices: Secondary | ICD-10-CM | POA: Diagnosis not present

## 2021-04-02 DIAGNOSIS — I459 Conduction disorder, unspecified: Secondary | ICD-10-CM | POA: Diagnosis not present

## 2021-04-02 DIAGNOSIS — Z6837 Body mass index (BMI) 37.0-37.9, adult: Secondary | ICD-10-CM | POA: Diagnosis not present

## 2021-04-02 DIAGNOSIS — Z7901 Long term (current) use of anticoagulants: Secondary | ICD-10-CM | POA: Diagnosis not present

## 2021-04-02 DIAGNOSIS — I483 Typical atrial flutter: Secondary | ICD-10-CM | POA: Diagnosis not present

## 2021-04-02 DIAGNOSIS — I480 Paroxysmal atrial fibrillation: Secondary | ICD-10-CM | POA: Diagnosis not present

## 2021-04-02 DIAGNOSIS — Z79899 Other long term (current) drug therapy: Secondary | ICD-10-CM | POA: Insufficient documentation

## 2021-04-02 MED ORDER — AMIODARONE HCL 200 MG PO TABS: ORAL_TABLET | ORAL | 0 refills | Status: DC

## 2021-04-02 NOTE — Progress Notes (Signed)
Primary Care Physician: Everrett Coombe, DO Primary Cardiologist: Dr Katrinka Blazing Primary Electrophysiologist: Dr [name] Referring Physician: Dr [name]  Sleep medicine: Dr Franchot Gallo is a 71 y.o. female with a history of OSA, HTN, atrial flutter, atrial fibrillation who presents for follow up in the [facility]. She was diagnosed with atrial fibrillation on 07/18/16. She had 2 episodes of chest pressure with radiation to her arm in late May or early June. It lasted 20 minutes. EMS was called but she felt better. 10 days later, she had similar symptoms.  Went to the emergency room where she had a wide-complex tachycardia.  While in the emergency room, her rate slowed and showed a likely atrial flutter.  It was thought that this was the same rhythm with the wide-complex tachycardia being apparent 1-1 conduction.  She reverted to sinus rhythm with IV medications. She is now s/p afib/flutter ablation 11/09/17. Patient is on Eliquis for a CHADS2VASC score of 3.   She had persistent heart racing since 03/07/21. She did drink a drink high in sodium before the onset of her symptoms which she believes could have been a trigger for her. Her metoprolol was increased and she was scheduled for outpatient DCCV but she presented to the ED the next day for urgent DCCV on 03/12/21.  On follow up today, patient reports that a few days ago, she took Mucinex DM for congestion and ever since then she has had intermittent heart racing. She is back in typical atrial flutter today with rapid rates. She does feel very fatigued.    Today, she denies symptoms of chest pain, shortness of breath, PND, lower extremity edema, dizziness, presyncope, syncope, bleeding, or neurologic sequela. The patient is tolerating medications without difficulties and is otherwise without complaint today.    Atrial Fibrillation Risk Factors:  she does have symptoms or diagnosis of sleep apnea. she is compliant  with CPAP therapy. she does not have a history of rheumatic fever.   she has a BMI of Body mass index is 37.04 kg/m.Marland Kitchen Filed Weights   04/02/21 1133  Weight: 101 kg     Family History  Problem Relation Age of Onset   CAD Father    AAA (abdominal aortic aneurysm) Sister    Thyroid disease Sister    Allergies Other    Clotting disorder Other    Hypertension Other    Heart disease Other    Rheum arthritis Other    Migraines Other      Atrial Fibrillation Management history:  Previous antiarrhythmic drugs: flecainide, amiodarone  Previous cardioversions: 03/12/21 Previous ablations: 11/09/17 afib and flutter CHADS2VASC score: 3 Anticoagulation history: Eliquis   Past Medical History:  Diagnosis Date   Anxiety    Atrial fibrillation (HCC)    Complication of anesthesia    "woke up during colonoscopy 02/2017" (11/09/2017)   GERD (gastroesophageal reflux disease)    H/O atrial flutter    History of blood transfusion 02/2017   LGIB (11/09/2017)   Hypertension    Migraine    "a few/year" (11/09/2017)   OSA on CPAP    Past Surgical History:  Procedure Laterality Date   ATRIAL FIBRILLATION ABLATION N/A 11/09/2017   Procedure: ATRIAL FIBRILLATION ABLATION;  Surgeon: Regan Lemming, MD;  [facility]: [facility];  Service: Cardiovascular;  Laterality: N/A;   COLONOSCOPY N/A 02/24/2017   Procedure: COLONOSCOPY;  Surgeon: Jeani Hawking, MD;  Location: [facility] ENDOSCOPY;  Service: Gastroenterology;  Laterality: N/A;  ENTEROSCOPY N/A 02/24/2017   Procedure: ENTEROSCOPY;  Surgeon: Carol Ada, MD;  Location: [place];  Service: Gastroenterology;  Laterality: N/A;   HERNIA REPAIR  1960s   WISDOM TOOTH EXTRACTION      Current Outpatient Medications  Medication Sig Dispense Refill   Dextromethorphan-guaiFENesin (CORICIDIN HBP CONGESTION/COUGH PO) Take 2 tablets by mouth at bedtime as needed (congestion).     diltiazem (CARDIZEM CD) 240 MG 24 hr capsule Take 1 capsule (240  mg total) by mouth every morning. 30 capsule 3   ELIQUIS 5 MG TABS tablet TAKE 1 TABLET BY MOUTH TWICE A DAY 60 tablet 5   furosemide (LASIX) 20 MG tablet TAKE 1 TABLET BY MOUTH EVERY DAY AS NEEDED FOR SWELLING 90 tablet 3   losartan (COZAAR) 50 MG tablet TAKE 1 TABLET BY MOUTH EVERY DAY 90 tablet 2   metoprolol succinate (TOPROL-XL) 50 MG 24 hr tablet Take 1 tablet (50 mg total) by mouth 2 (two) times daily. 60 tablet 3   Multiple Vitamin (MULTIVITAMIN) LIQD Take 30 mLs by mouth daily. Nutriburst     omeprazole (PRILOSEC) 40 MG capsule Take 1 capsule (40 mg total) by mouth daily. 90 capsule 3   OVER THE COUNTER MEDICATION Take 1 tablet by mouth at bedtime as needed (pain). Tylenol muscle relaxer and pain     PRESCRIPTION MEDICATION Inhale into the lungs at bedtime. CPAP     Zoster Vaccine Adjuvanted [facility]) injection Inject into the muscle. 0.5 mL 0   No current facility-administered medications for this encounter.    Allergies  Allergen Reactions   Diphenhydramine Hcl Hives and Other (See Comments)    Erratic heartbeat   Flecainide Rash    Social History   Socioeconomic History   Marital status: Married    Spouse name: Not on file   Number of children: Not on file   Years of education: Not on file   Highest education level: Not on file  Occupational History   Not on file  Tobacco Use   Smoking status: Former    Packs/day: 0.25    Years: 10.00    Pack years: 2.50    Types: Cigarettes   Smokeless tobacco: Never   Tobacco comments:    11/09/2017 "quit smoking in the 1980s"  Vaping Use   Vaping Use: Never used  Substance and Sexual Activity   Alcohol use: Never   Drug use: Never   Sexual activity: Not Currently  Other Topics Concern   Not on file  Social History Narrative   Not on file   Social Determinants of Health   Financial Resource Strain: Not on file  Food Insecurity: Not on file  Transportation Needs: Not on file  Physical Activity: Not on file   Stress: Not on file  Social Connections: Not on file  Intimate Partner Violence: Not on file     ROS- All systems are reviewed and negative except as per the HPI above.  Physical Exam: Vitals:   04/02/21 1133  BP: (!) 138/100  Pulse: (!) 150  Weight: 101 kg  Height: 5\' 5"  (1.651 m)    GEN- The patient is a well appearing obese female, alert and oriented x 3 today.   HEENT-head normocephalic, atraumatic, sclera clear, conjunctiva pink, hearing intact, trachea midline. Lungs- Clear to ausculation bilaterally, normal work of breathing Heart- Regular rate and rhythm, tachycardia, no murmurs, rubs or gallops  GI- soft, NT, ND, + BS Extremities- no clubbing, cyanosis, or edema MS- no significant deformity or  atrophy Skin- no rash or lesion Psych- euthymic mood, full affect Neuro- strength and sensation are intact   Wt Readings from Last 3 Encounters:  04/02/21 101 kg  03/11/21 101 kg  05/19/20 100.7 kg    EKG today demonstrates  Typical atrial flutter with 2:1 block Vent. rate 150 BPM PR interval * ms QRS duration 106 ms QT/QTcB 254/401 ms  Echo 09/18/17 demonstrated  - Left ventricle: The cavity size was normal. Wall thickness was    increased in a pattern of mild LVH. Systolic function was normal.    The estimated ejection fraction was in the range of 55% to 60%.    Wall motion was normal; there were no regional wall motion    abnormalities. Doppler parameters are consistent with restrictive    physiology, indicative of decreased left ventricular diastolic    compliance and/or increased left atrial pressure.  - Mitral valve: Calcified annulus. There was moderate    regurgitation.  - Left atrium: The atrium was severely dilated.  - Atrial septum: The septum bowed from left to right, consistent    with increased left atrial pressure.  - Pulmonary arteries: Systolic pressure was moderately increased.    PA peak pressure: 52 mm Hg (S).   Impressions:   - Normal LV  function; mild LVH; restrictive filling; moderate MR;    severe LAE; mild TR with moderate pulmonary hypertension.  Epic records are reviewed at length today  CHA2DS2-VASc Score = 3  The patient's score is based upon: CHF History: 0 HTN History: 1 Diabetes History: 0 Stroke History: 0 Vascular Disease History: 0 Age Score: 1 Gender Score: 1       ASSESSMENT AND PLAN: 1. Paroxysmal Atrial Fibrillation/atrial flutter The patient's CHA2DS2-VASc score is 3, indicating a 3.2% annual risk of stroke.   S/p DCCV 03/12/21 with early return of atrial flutter. We discussed therapeutic options today.  Will start amiodarone 200 mg BID as a bridge to repeat ablation.  She already has a visit with Dr Curt Bears scheduled for 05/14/21. Continue diltiazem 240 mg daily Continue Toprol 50 mg BID Continue Eliquis 5 mg BID  2. Secondary Hypercoagulable State (ICD10:  D68.69) The patient is at significant risk for stroke/thromboembolism based upon her CHA2DS2-VASc Score of 3.  Continue Apixaban (Eliquis).   3. Obesity Body mass index is 37.04 kg/m. Lifestyle modification was discussed and encouraged including regular physical activity and weight reduction.  4. Obstructive sleep apnea Followed by Dr Radford Pax. Patient reports compliance with CPAP therapy.  5. HTN No changes today, will reassess in SR.   Follow up in the AF clinic in 2 weeks. Sooner with Dr Curt Bears if available.    [facility] [address] [place], [place] [address] [phone] 04/02/2021 11:40 AM

## 2021-04-02 NOTE — Patient Instructions (Signed)
Start Amiodarone 200mg  twice a day for the next month then reduce to 1 tablet daily

## 2021-04-09 ENCOUNTER — Telehealth (HOSPITAL_COMMUNITY): Payer: Self-pay | Admitting: *Deleted

## 2021-04-09 MED ORDER — METOPROLOL SUCCINATE ER 50 MG PO TB24: 75.0000 mg | ORAL_TABLET | Freq: Two times a day (BID) | ORAL | 3 refills | Status: DC

## 2021-04-09 NOTE — Telephone Encounter (Signed)
Patient called in stating her HRs are still in the 130s despite starting amiodarone. Discussed with Jorja Loa PA will increase metoprolol to 75mg  BID. Pt will call if issues arise prior to appt next week. Pt in agreement. ?

## 2021-04-10 ENCOUNTER — Ambulatory Visit (INDEPENDENT_AMBULATORY_CARE_PROVIDER_SITE_OTHER): Payer: Medicare Other | Admitting: Family Medicine

## 2021-04-10 DIAGNOSIS — Z1231 Encounter for screening mammogram for malignant neoplasm of breast: Secondary | ICD-10-CM

## 2021-04-10 DIAGNOSIS — Z0000 Encounter for general adult medical examination without abnormal findings: Secondary | ICD-10-CM

## 2021-04-10 DIAGNOSIS — Z780 Asymptomatic menopausal state: Secondary | ICD-10-CM

## 2021-04-10 NOTE — Progress Notes (Signed)
MEDICARE ANNUAL WELLNESS VISIT  04/10/2021  Telephone Visit Disclaimer This Medicare AWV was conducted by telephone due to national recommendations for restrictions regarding the COVID-19 Pandemic (e.g. social distancing).  I verified, using two identifiers, that I am speaking with Mary ServeSandra J Ferrer or their authorized healthcare agent. I discussed the limitations, risks, security, and privacy concerns of performing an evaluation and management service by telephone and the potential availability of an in-person appointment in the future. The patient expressed understanding and agreed to proceed.  Location of Patient: Home Location of Provider (nurse):  In the office.  Subjective:    Mary Poole is a 71 y.o. female patient of Everrett CoombeMatthews, Cody, DO who had a Medicare Annual Wellness Visit today via telephone. Mary DavenportSandra is Retired and lives with their spouse. she has 0 children. she reports that she is socially active and does interact with friends/family regularly. she is minimally physically active and enjoys reading.  Patient Care Team: Everrett CoombeMatthews, Cody, DO as PCP - General (Family Medicine) Regan Lemmingamnitz, Will Martin, MD as PCP - Electrophysiology (Cardiology) Quintella Reicherturner, Traci R, MD as PCP - Cardiology (Cardiology)  Advanced Directives 04/10/2021 03/12/2021 04/10/2020 12/27/2018 11/09/2017 11/09/2017 10/30/2017  Does Patient Have a Medical Advance Directive? Yes No Yes No No No No  Type of Estate agentAdvance Directive Healthcare Power of [place];Living will - Living will - - - -  Does patient want to make changes to medical advance directive? No - Patient declined - No - Patient declined - - - -  Copy of Healthcare Power of Attorney in Chart? No - copy requested - - - - - -  Would patient like information on creating a medical advance directive? - No - Guardian declined - No - Patient declined No - Patient declined No - Patient declined No - Patient declined    Hospital Utilization Over the Past 12 Months: # of  hospitalizations or ER visits: 1 # of surgeries: 0  Review of Systems    Patient reports that her overall health is better compared to last year.  History obtained from chart review and the patient  Patient Reported Readings (BP, Pulse, CBG, Weight, etc) none  Pain Assessment Pain : No/denies pain     Current Medications & Allergies (verified) Allergies as of 04/10/2021       Reactions   Diphenhydramine Hcl Hives, Other (See Comments)   Erratic heartbeat   Flecainide Rash        Medication List        Accurate as of April 10, 2021  1:30 PM. If you have any questions, ask your nurse or doctor.          amiodarone 200 MG tablet Commonly known as: PACERONE Take 1 tablet by mouth twice a day then reduce to 1 tablet daily   CORICIDIN HBP CONGESTION/COUGH PO Take 2 tablets by mouth at bedtime as needed (congestion).   diltiazem 240 MG 24 hr capsule Commonly known as: CARDIZEM CD Take 1 capsule (240 mg total) by mouth every morning.   Eliquis 5 MG Tabs tablet Generic drug: apixaban TAKE 1 TABLET BY MOUTH TWICE A DAY   furosemide 20 MG tablet Commonly known as: LASIX TAKE 1 TABLET BY MOUTH EVERY DAY AS NEEDED FOR SWELLING   losartan 50 MG tablet Commonly known as: COZAAR TAKE 1 TABLET BY MOUTH EVERY DAY   metoprolol succinate 50 MG 24 hr tablet Commonly known as: TOPROL-XL Take 1.5 tablets (75 mg total) by mouth 2 (two) times daily.  multivitamin Liqd Take 30 mLs by mouth daily. Nutriburst   omeprazole 40 MG capsule Commonly known as: PRILOSEC Take 1 capsule (40 mg total) by mouth daily.   OVER THE COUNTER MEDICATION Take 1 tablet by mouth at bedtime as needed (pain). Tylenol muscle relaxer and pain   PRESCRIPTION MEDICATION Inhale into the lungs at bedtime. CPAP   Shingrix injection Generic drug: Zoster Vaccine Adjuvanted Inject into the muscle.        History (reviewed): Past Medical History:  Diagnosis Date   Anxiety    Atrial  fibrillation (HCC)    Complication of anesthesia    "woke up during colonoscopy 02/2017" (11/09/2017)   GERD (gastroesophageal reflux disease)    H/O atrial flutter    History of blood transfusion 02/2017   LGIB (11/09/2017)   Hypertension    Migraine    "a few/year" (11/09/2017)   OSA on CPAP    Past Surgical History:  Procedure Laterality Date   ATRIAL FIBRILLATION ABLATION N/A 11/09/2017   Procedure: ATRIAL FIBRILLATION ABLATION;  Surgeon: Regan Lemming, MD;  Location: [facility];  Service: Cardiovascular;  Laterality: N/A;   COLONOSCOPY N/A 02/24/2017   Procedure: COLONOSCOPY;  Surgeon: Jeani Hawking, MD;  Location: [facility] ENDOSCOPY;  Service: Gastroenterology;  Laterality: N/A;   ENTEROSCOPY N/A 02/24/2017   Procedure: ENTEROSCOPY;  Surgeon: Jeani Hawking, MD;  Location: [facility] ENDOSCOPY;  Service: Gastroenterology;  Laterality: N/A;   HERNIA REPAIR  1960s   WISDOM TOOTH EXTRACTION     Family History  Problem Relation Age of Onset   CAD Father    AAA (abdominal aortic aneurysm) Sister    Thyroid disease Sister    Allergies Other    Clotting disorder Other    Hypertension Other    Heart disease Other    Rheum arthritis Other    Migraines Other    Social History   Socioeconomic History   Marital status: Married    Spouse name: Cyd Silence.   Number of children: 0   Years of education: 16   Highest education level: Bachelor's degree (e.g., BA, AB, BS)  Occupational History   Occupation: Retired  Tobacco Use   Smoking status: Former    Packs/day: 0.25    Years: 10.00    Pack years: 2.50    Types: Cigarettes   Smokeless tobacco: Never   Tobacco comments:    11/09/2017 "quit smoking in the 1980s"  Vaping Use   Vaping Use: Never used  Substance and Sexual Activity   Alcohol use: Never   Drug use: Never   Sexual activity: Not Currently  Other Topics Concern   Not on file  Social History Narrative   Lives with her husband. She enjoys reading in her free time.    Social Determinants of Health   Financial Resource Strain: Low Risk    Difficulty of Paying Living Expenses: Not hard at all  Food Insecurity: No Food Insecurity   Worried About Programme researcher, broadcasting/film/video in the Last Year: Never true   Ran Out of Food in the Last Year: Never true  Transportation Needs: No Transportation Needs   Lack of Transportation (Medical): No   Lack of Transportation (Non-Medical): No  Physical Activity: Inactive   Days of Exercise per Week: 0 days   Minutes of Exercise per Session: 0 min  Stress: No Stress Concern Present   Feeling of Stress : Not at all  Social Connections: Socially Integrated   Frequency of Communication with Friends and  Family: More than three times a week   Frequency of Social Gatherings with Friends and Family: Once a week   Attends Religious Services: More than 4 times per year   Active Member of [employer] or Organizations: Yes   Attends Engineer, structural: More than 4 times per year   Marital Status: Married    Activities of Daily Living In your present state of health, do you have any difficulty performing the following activities: 04/10/2021  Hearing? N  Vision? N  Difficulty concentrating or making decisions? N  Walking or climbing stairs? N  Dressing or bathing? N  Doing errands, shopping? N  Preparing Food and eating ? N  Using the Toilet? N  In the past six months, have you accidently leaked urine? N  Do you have problems with loss of bowel control? N  Managing your Medications? N  Managing your Finances? N  Housekeeping or managing your Housekeeping? N  Some recent data might be hidden    Patient Education/ Literacy How often do you need to have someone help you when you read instructions, pamphlets, or other written materials from your doctor or pharmacy?: 1 - Never What is the last grade level you completed in school?: Bachelor's degree  Exercise Current Exercise Habits: The patient does not participate in  regular exercise at present, Exercise limited by: cardiac condition(s)  Diet Patient reports consuming 2 meals a day and 2 snack(s) a day Patient reports that her primary diet is: Regular Patient reports that she does have regular access to food.   Depression Screen PHQ 2/9 Scores 04/10/2021 04/10/2020 12/27/2018 10/26/2018 07/20/2017  PHQ - 2 Score 0 0 0 1 0  PHQ- 9 Score - 0 - 11 -     Fall Risk Fall Risk  04/10/2021 04/10/2020 12/27/2018 07/20/2017  Falls in the past year? 1 1 0 No  Number falls in past yr: 0 1 0 -  Injury with Fall? 1 0 0 -  Risk for fall due to : History of fall(s) Impaired balance/gait - -  Follow up Falls evaluation completed Falls evaluation completed Education provided;Falls prevention discussed -     Objective:  Mary Poole seemed alert and oriented and she participated appropriately during our telephone visit.  Blood Pressure Weight BMI  BP Readings from Last 3 Encounters:  04/02/21 (!) 138/100  03/12/21 108/84  03/11/21 138/88   Wt Readings from Last 3 Encounters:  04/02/21 222 lb 9.6 oz (101 kg)  03/11/21 222 lb 9.6 oz (101 kg)  05/19/20 222 lb (100.7 kg)   BMI Readings from Last 1 Encounters:  04/02/21 37.04 kg/m    *Unable to obtain current vital signs, weight, and BMI due to telephone visit type  Hearing/Vision  Eloah did not seem to have difficulty with hearing/understanding during the telephone conversation Reports that she has not had a formal eye exam by an eye care professional within the past year Reports that she has not had a formal hearing evaluation within the past year *Unable to fully assess hearing and vision during telephone visit type  Cognitive Function: 6CIT Screen 04/10/2021  What Year? 0 points  What month? 0 points  What time? 0 points  Count back from 20 0 points  Months in reverse 0 points  Repeat phrase 2 points  Total Score 2   (Normal:0-7, Significant for Dysfunction: >8)  Normal Cognitive Function Screening:  Yes   Immunization & Health Maintenance Record Immunization History  Administered Date(s) Administered  PFIZER Comirnaty(Gray Top)Covid-19 Tri-Sucrose Vaccine 08/05/2020   PFIZER(Purple Top)SARS-COV-2 Vaccination 03/24/2019, 04/18/2019, 12/08/2019   Pfizer Covid-19 Vaccine Bivalent Booster 84yrs & up 12/19/2020   Zoster Recombinat (Shingrix) 07/12/2020, 03/24/2021    Health Maintenance  Topic Date Due   Pneumonia Vaccine 68+ Years old (1 - PCV) 04/11/2022 (Originally 10/11/2015)   MAMMOGRAM  04/11/2022 (Originally 10/10/2000)   DEXA SCAN  04/11/2022 (Originally 10/11/2015)   Hepatitis C Screening  04/11/2022 (Originally 10/10/1968)   COLONOSCOPY (Pts 45-82yrs Insurance coverage will need to be confirmed)  02/25/2027   COVID-19 Vaccine  Completed   Zoster Vaccines- Shingrix  Completed   HPV VACCINES  Aged Out   INFLUENZA VACCINE  Discontinued   TETANUS/TDAP  Discontinued       Assessment  This is a routine wellness examination for Mary Poole.  Health Maintenance: Due or Overdue There are no preventive care reminders to display for this patient.   Mary Poole does not need a referral for Community Assistance: Care Management:   no Social Work:    no Prescription Assistance:  no Nutrition/Diabetes Education:  no   Plan:  Personalized Goals  Goals Addressed               This Visit's Progress     Patient Stated (pt-stated)        Would like to loose about 60 lbs.       Personalized Health Maintenance & Screening Recommendations    Lung Cancer Screening Recommended: no (Low Dose CT Chest recommended if Age 73-80 years, 30 pack-year currently smoking OR have quit w/in past 15 years) Hepatitis C Screening recommended: no HIV Screening recommended: no  Advanced Directives: Written information was not prepared per patient's request.  Referrals & Orders Orders Placed This Encounter  Procedures   Mammogram 3D SCREEN BREAST BILATERAL   DEXAScan     Follow-up Plan Follow-up with Everrett Coombe, DO as planned Bone density and mammogram referral has been sent.  Schedule your pneumonia shot at your pharmacy or we can do it in the office. Medicare wellness visit in one year.  Patient will access AVS on mychart.   I have personally reviewed and noted the following in the patients chart:   Medical and social history Use of alcohol, tobacco or illicit drugs  Current medications and supplements Functional ability and status Nutritional status Physical activity Advanced directives List of other physicians Hospitalizations, surgeries, and ER visits in previous 12 months Vitals Screenings to include cognitive, depression, and falls Referrals and appointments  In addition, I have reviewed and discussed with Mary Poole certain preventive protocols, quality metrics, and best practice recommendations. A written personalized care plan for preventive services as well as general preventive health recommendations is available and can be mailed to the patient at her request.      Modesto Charon, RN  04/10/2021

## 2021-04-10 NOTE — Patient Instructions (Addendum)
[place] Maintenance Summary and Written Plan of Care  [name] ,  Thank you for allowing me to perform your Medicare Annual Wellness Visit and for your ongoing commitment to your health.   Health Maintenance & Immunization History Health Maintenance  Topic Date Due   Pneumonia Vaccine 39+ Years old (1 - PCV) 04/11/2022 (Originally 10/11/2015)   MAMMOGRAM  04/11/2022 (Originally 10/10/2000)   DEXA SCAN  04/11/2022 (Originally 10/11/2015)   Hepatitis C Screening  04/11/2022 (Originally 10/10/1968)   COLONOSCOPY (Pts 45-47yrs Insurance coverage will need to be confirmed)  02/25/2027   COVID-19 Vaccine  Completed   Zoster Vaccines- Shingrix  Completed   HPV VACCINES  Aged Out   INFLUENZA VACCINE  Discontinued   TETANUS/TDAP  Discontinued   Immunization History  Administered Date(s) Administered   PFIZER Comirnaty(Gray Top)Covid-19 Tri-Sucrose Vaccine 08/05/2020   PFIZER(Purple Top)SARS-COV-2 Vaccination 03/24/2019, 04/18/2019, 12/08/2019   Pfizer Covid-19 Vaccine Bivalent Booster 75yrs & up 12/19/2020   Zoster Recombinat (Shingrix) 07/12/2020, 03/24/2021    These are the patient goals that we discussed:  Goals Addressed              This Visit's Progress     Patient Stated (pt-stated)        Would like to loose about 60 lbs.        This is a list of Health Maintenance Items that are overdue or due now: Pneumococcal vaccine  Screening mammography Bone densitometry screening    Orders/Referrals Placed Today: Orders Placed This Encounter  Procedures   Mammogram 3D SCREEN BREAST BILATERAL    Standing Status:   Future    Standing Expiration Date:   04/11/2022    Scheduling Instructions:     Please call patient to schedule.    Order Specific Question:   Reason for Exam (SYMPTOM  OR DIAGNOSIS REQUIRED)    Answer:   Breast cancer screening    Order Specific Question:   Preferred imaging location?    Answer:   [place]    Standing Status:   Future    Standing Expiration Date:   04/11/2022    Scheduling Instructions:     Please call patient to schedule.    Order Specific Question:   Reason for exam:    Answer:   Post Menopausal    Order Specific Question:   Preferred imaging location?    Answer:   [facility]   (Contact our referral department at [phone] if you have not spoken with someone about your referral appointment within the next 5 days)    Follow-up Plan Follow-up with Luetta Nutting, DO as planned Bone density and mammogram referral has been sent.  Schedule your pneumonia shot at your pharmacy or we can do it in the office. Medicare wellness visit in one year.  Patient will access AVS on mychart.    Health Maintenance, Female Adopting a healthy lifestyle and getting preventive care are important in promoting health and wellness. Ask your health care provider about: The right schedule for you to have regular tests and exams. Things you can do on your own to prevent diseases and keep yourself healthy. What should I know about diet, weight, and exercise? Eat a healthy diet  Eat a diet that includes plenty of vegetables, fruits, low-fat dairy products, and lean protein. Do not eat a lot of foods that are high in solid fats, added sugars, or sodium. Maintain a healthy weight Body mass index (BMI)  is used to identify weight problems. It estimates body fat based on height and weight. Your health care provider can help determine your BMI and help you achieve or maintain a healthy weight. Get regular exercise Get regular exercise. This is one of the most important things you can do for your health. Most adults should: Exercise for at least 150 minutes each week. The exercise should increase your heart rate and make you sweat (moderate-intensity exercise). Do strengthening exercises at least twice a week. This is in addition to the moderate-intensity exercise. Spend less time  sitting. Even light physical activity can be beneficial. Watch cholesterol and blood lipids Have your blood tested for lipids and cholesterol at 71 years of age, then have this test every 5 years. Have your cholesterol levels checked more often if: Your lipid or cholesterol levels are high. You are older than 71 years of age. You are at high risk for heart disease. What should I know about cancer screening? Depending on your health history and family history, you may need to have cancer screening at various ages. This may include screening for: Breast cancer. Cervical cancer. Colorectal cancer. Skin cancer. Lung cancer. What should I know about heart disease, diabetes, and high blood pressure? Blood pressure and heart disease High blood pressure causes heart disease and increases the risk of stroke. This is more likely to develop in people who have high blood pressure readings or are overweight. Have your blood pressure checked: Every 3-5 years if you are 85-73 years of age. Every year if you are 45 years old or older. Diabetes Have regular diabetes screenings. This checks your fasting blood sugar level. Have the screening done: Once every three years after age 27 if you are at a normal weight and have a low risk for diabetes. More often and at a younger age if you are overweight or have a high risk for diabetes. What should I know about preventing infection? Hepatitis B If you have a higher risk for hepatitis B, you should be screened for this virus. Talk with your health care provider to find out if you are at risk for hepatitis B infection. Hepatitis C Testing is recommended for: Everyone born from [birth year] through 1965. Anyone with known risk factors for hepatitis C. Sexually transmitted infections (STIs) Get screened for STIs, including gonorrhea and chlamydia, if: You are sexually active and are younger than 71 years of age. You are older than 71 years of age and your health care  provider tells you that you are at risk for this type of infection. Your sexual activity has changed since you were last screened, and you are at increased risk for chlamydia or gonorrhea. Ask your health care provider if you are at risk. Ask your health care provider about whether you are at high risk for HIV. Your health care provider may recommend a prescription medicine to help prevent HIV infection. If you choose to take medicine to prevent HIV, you should first get tested for HIV. You should then be tested every 3 months for as long as you are taking the medicine. Pregnancy If you are about to stop having your period (premenopausal) and you may become pregnant, seek counseling before you get pregnant. Take 400 to 800 micrograms (mcg) of folic acid every day if you become pregnant. Ask for birth control (contraception) if you want to prevent pregnancy. Osteoporosis and menopause Osteoporosis is a disease in which the bones lose minerals and strength with aging. This can result  in bone fractures. If you are 66 years old or older, or if you are at risk for osteoporosis and fractures, ask your health care provider if you should: Be screened for bone loss. Take a calcium or vitamin D supplement to lower your risk of fractures. Be given hormone replacement therapy (HRT) to treat symptoms of menopause. Follow these instructions at home: Alcohol use Do not drink alcohol if: Your health care provider tells you not to drink. You are pregnant, may be pregnant, or are planning to become pregnant. If you drink alcohol: Limit how much you have to: 0-1 drink a day. Know how much alcohol is in your drink. In the [place] one drink equals one 12 oz bottle of beer (355 mL), one 5 oz glass of wine (148 mL), or one 1 oz glass of hard liquor (44 mL). Lifestyle Do not use any products that contain nicotine or tobacco. These products include cigarettes, chewing tobacco, and vaping devices, such as e-cigarettes.  If you need help quitting, ask your health care provider. Do not use street drugs. Do not share needles. Ask your health care provider for help if you need support or information about quitting drugs. General instructions Schedule regular health, dental, and eye exams. Stay current with your vaccines. Tell your health care provider if: You often feel depressed. You have ever been abused or do not feel safe at home. Summary Adopting a healthy lifestyle and getting preventive care are important in promoting health and wellness. Follow your health care provider's instructions about healthy diet, exercising, and getting tested or screened for diseases. Follow your health care provider's instructions on monitoring your cholesterol and blood pressure. This information is not intended to replace advice given to you by your health care provider. Make sure you discuss any questions you have with your health care provider. Document Revised: 06/16/2020 Document Reviewed: 06/16/2020 Elsevier Patient Education  [place].

## 2021-04-16 ENCOUNTER — Other Ambulatory Visit: Payer: Self-pay | Admitting: Family Medicine

## 2021-04-16 ENCOUNTER — Encounter (HOSPITAL_COMMUNITY): Payer: Self-pay | Admitting: Physician Assistant

## 2021-04-16 ENCOUNTER — Ambulatory Visit (HOSPITAL_COMMUNITY)
Admission: RE | Admit: 2021-04-16 | Discharge: 2021-04-16 | Disposition: A | Discharge status: Home / Self Care | Payer: Medicare Other | Source: Ambulatory Visit | Attending: Physician Assistant | Admitting: Physician Assistant

## 2021-04-16 ENCOUNTER — Other Ambulatory Visit: Payer: Self-pay

## 2021-04-16 VITALS — BP 110/78 | HR 127 | Ht 65.0 in | Wt 214.0 lb

## 2021-04-16 DIAGNOSIS — I4819 Other persistent atrial fibrillation: Secondary | ICD-10-CM | POA: Insufficient documentation

## 2021-04-16 DIAGNOSIS — I4892 Unspecified atrial flutter: Secondary | ICD-10-CM | POA: Insufficient documentation

## 2021-04-16 DIAGNOSIS — G4733 Obstructive sleep apnea (adult) (pediatric): Secondary | ICD-10-CM | POA: Insufficient documentation

## 2021-04-16 DIAGNOSIS — D6869 Other thrombophilia: Secondary | ICD-10-CM | POA: Insufficient documentation

## 2021-04-16 DIAGNOSIS — I483 Typical atrial flutter: Secondary | ICD-10-CM

## 2021-04-16 DIAGNOSIS — E669 Obesity, unspecified: Secondary | ICD-10-CM | POA: Diagnosis not present

## 2021-04-16 DIAGNOSIS — Z6835 Body mass index (BMI) 35.0-35.9, adult: Secondary | ICD-10-CM | POA: Diagnosis not present

## 2021-04-16 DIAGNOSIS — Z7901 Long term (current) use of anticoagulants: Secondary | ICD-10-CM | POA: Insufficient documentation

## 2021-04-16 DIAGNOSIS — I10 Essential (primary) hypertension: Secondary | ICD-10-CM | POA: Insufficient documentation

## 2021-04-16 NOTE — Progress Notes (Signed)
Primary Care Physician: Everrett Coombe, DO Primary Cardiologist: Dr Katrinka Blazing Primary Electrophysiologist: Dr [name] Referring Physician: Dr [name]  Sleep medicine: Dr Franchot Gallo is a 71 y.o. female with a history of OSA, HTN, atrial flutter, atrial fibrillation who presents for follow up in the [facility]. She was diagnosed with atrial fibrillation on 07/18/16. She had 2 episodes of chest pressure with radiation to her arm in late May or early June. It lasted 20 minutes. EMS was called but she felt better. 10 days later, she had similar symptoms.  Went to the emergency room where she had a wide-complex tachycardia.  While in the emergency room, her rate slowed and showed a likely atrial flutter.  It was thought that this was the same rhythm with the wide-complex tachycardia being apparent 1-1 conduction.  She reverted to sinus rhythm with IV medications. She is now s/p afib/flutter ablation 11/09/17. Patient is on Eliquis for a CHADS2VASC score of 3.   She had persistent heart racing since 03/07/21. She did drink a drink high in sodium before the onset of her symptoms which she believes could have been a trigger for her. Her metoprolol was increased and she was scheduled for outpatient DCCV but she presented to the ED the next day for urgent DCCV on 03/12/21. She unfortunately had early return of her atrial flutter and was started on amiodarone.   On follow up today, patient remains in rapid atrial flutter today. Her heart rate has improved from 150s to 120s. She continues to have dyspnea with activity. No bleeding issues on anticoagulation.   Today, she denies symptoms of chest pain, PND, lower extremity edema, dizziness, presyncope, syncope, bleeding, or neurologic sequela. The patient is tolerating medications without difficulties and is otherwise without complaint today.    Atrial Fibrillation Risk Factors:  she does have symptoms or diagnosis of sleep  apnea. she is compliant with CPAP therapy. she does not have a history of rheumatic fever.   she has a BMI of Body mass index is 35.61 kg/m.Marland Kitchen Filed Weights   04/16/21 1132  Weight: 97.1 kg      Family History  Problem Relation Age of Onset   CAD Father    AAA (abdominal aortic aneurysm) Sister    Thyroid disease Sister    Allergies Other    Clotting disorder Other    Hypertension Other    Heart disease Other    Rheum arthritis Other    Migraines Other      Atrial Fibrillation Management history:  Previous antiarrhythmic drugs: flecainide, amiodarone  Previous cardioversions: 03/12/21 Previous ablations: 11/09/17 afib and flutter CHADS2VASC score: 3 Anticoagulation history: Eliquis   Past Medical History:  Diagnosis Date   Anxiety    Atrial fibrillation (HCC)    Complication of anesthesia    "woke up during colonoscopy 02/2017" (11/09/2017)   GERD (gastroesophageal reflux disease)    H/O atrial flutter    History of blood transfusion 02/2017   LGIB (11/09/2017)   Hypertension    Migraine    "a few/year" (11/09/2017)   OSA on CPAP    Past Surgical History:  Procedure Laterality Date   ATRIAL FIBRILLATION ABLATION N/A 11/09/2017   Procedure: ATRIAL FIBRILLATION ABLATION;  Surgeon: Regan Lemming, MD;  Location: [facility] INVASIVE CV LAB;  Service: Cardiovascular;  Laterality: N/A;   COLONOSCOPY N/A 02/24/2017   Procedure: COLONOSCOPY;  Surgeon: Jeani Hawking, MD;  Location: [facility] ENDOSCOPY;  Service: Gastroenterology;  Laterality:  N/A;   ENTEROSCOPY N/A 02/24/2017   Procedure: ENTEROSCOPY;  Surgeon: Carol Ada, MD;  Location: [place];  Service: Gastroenterology;  Laterality: N/A;   HERNIA REPAIR  1960s   WISDOM TOOTH EXTRACTION      Current Outpatient Medications  Medication Sig Dispense Refill   amiodarone (PACERONE) 200 MG tablet Take 1 tablet by mouth twice a day then reduce to 1 tablet daily 60 tablet 0   Dextromethorphan-guaiFENesin (CORICIDIN HBP  CONGESTION/COUGH PO) Take 2 tablets by mouth at bedtime as needed (congestion).     diltiazem (CARDIZEM CD) 240 MG 24 hr capsule Take 1 capsule (240 mg total) by mouth every morning. 30 capsule 3   ELIQUIS 5 MG TABS tablet TAKE 1 TABLET BY MOUTH TWICE A DAY 60 tablet 5   furosemide (LASIX) 20 MG tablet TAKE 1 TABLET BY MOUTH EVERY DAY AS NEEDED FOR SWELLING 90 tablet 3   losartan (COZAAR) 50 MG tablet TAKE 1 TABLET BY MOUTH EVERY DAY 90 tablet 2   metoprolol succinate (TOPROL-XL) 50 MG 24 hr tablet Take 1.5 tablets (75 mg total) by mouth 2 (two) times daily. 60 tablet 3   Multiple Vitamin (MULTIVITAMIN) LIQD Take 30 mLs by mouth daily. Nutriburst     omeprazole (PRILOSEC) 40 MG capsule TAKE 1 CAPSULE (40 MG TOTAL) BY MOUTH DAILY. 90 capsule 3   OVER THE COUNTER MEDICATION Take 1 tablet by mouth at bedtime as needed (pain). Tylenol muscle relaxer and pain     PRESCRIPTION MEDICATION Inhale into the lungs at bedtime. CPAP     Zoster Vaccine Adjuvanted [facility]) injection Inject into the muscle. 0.5 mL 0   No current facility-administered medications for this encounter.    Allergies  Allergen Reactions   Diphenhydramine Hcl Hives and Other (See Comments)    Erratic heartbeat   Flecainide Rash    Social History   Socioeconomic History   Marital status: Married    Spouse name: [name].   Number of children: 0   Years of education: 16   Highest education level: Bachelor's degree (e.g., BA, AB, BS)  Occupational History   Occupation: Retired  Tobacco Use   Smoking status: Former    Packs/day: 0.25    Years: 10.00    Pack years: 2.50    Types: Cigarettes   Smokeless tobacco: Never   Tobacco comments:    11/09/2017 "quit smoking in the 1980s"  Vaping Use   Vaping Use: Never used  Substance and Sexual Activity   Alcohol use: Never   Drug use: Never   Sexual activity: Not Currently  Other Topics Concern   Not on file  Social History Narrative   Lives with her husband. She  enjoys reading in her free time.   Social Determinants of Health   Financial Resource Strain: Low Risk    Difficulty of Paying Living Expenses: Not hard at all  Food Insecurity: No Food Insecurity   Worried About Charity fundraiser in the Last Year: Never true   [place] in the Last Year: Never true  Transportation Needs: No Transportation Needs   Lack of Transportation (Medical): No   Lack of Transportation (Non-Medical): No  Physical Activity: Inactive   Days of Exercise per Week: 0 days   Minutes of Exercise per Session: 0 min  Stress: No Stress Concern Present   Feeling of Stress : Not at all  Social Connections: Socially Integrated   Frequency of Communication with Friends and Family: More than  three times a week   Frequency of Social Gatherings with Friends and Family: Once a week   Attends Religious Services: More than 4 times per year   Active Member of [employer] or Organizations: Yes   Attends Music therapist: More than 4 times per year   Marital Status: Married  Human resources officer Violence: Not At Risk   Fear of Current or Ex-Partner: No   Emotionally Abused: No   Physically Abused: No   Sexually Abused: No     ROS- All systems are reviewed and negative except as per the HPI above.  Physical Exam: Vitals:   04/16/21 1132  BP: 110/78  Pulse: (!) 127  Weight: 97.1 kg  Height: 5\' 5"  (1.651 m)    GEN- The patient is a well appearing obese female, alert and oriented x 3 today.   HEENT-head normocephalic, atraumatic, sclera clear, conjunctiva pink, hearing intact, trachea midline. Lungs- Clear to ausculation bilaterally, normal work of breathing Heart- Regular rate and rhythm, tachycardia, no murmurs, rubs or gallops  GI- soft, NT, ND, + BS Extremities- no clubbing, cyanosis, or edema MS- no significant deformity or atrophy Skin- no rash or lesion Psych- euthymic mood, full affect Neuro- strength and sensation are intact   Wt Readings from  Last 3 Encounters:  04/16/21 97.1 kg  04/02/21 101 kg  03/11/21 101 kg    EKG today demonstrates  Atrial flutter with 2:1 block, NO STEMI Vent. rate 127 BPM PR interval * ms QRS duration 90 ms QT/QTcB 398/578 ms  Echo 09/18/17 demonstrated  - Left ventricle: The cavity size was normal. Wall thickness was    increased in a pattern of mild LVH. Systolic function was normal.    The estimated ejection fraction was in the range of 55% to 60%.    Wall motion was normal; there were no regional wall motion    abnormalities. Doppler parameters are consistent with restrictive    physiology, indicative of decreased left ventricular diastolic    compliance and/or increased left atrial pressure.  - Mitral valve: Calcified annulus. There was moderate    regurgitation.  - Left atrium: The atrium was severely dilated.  - Atrial septum: The septum bowed from left to right, consistent    with increased left atrial pressure.  - Pulmonary arteries: Systolic pressure was moderately increased.    PA peak pressure: 52 mm Hg (S).   Impressions:   - Normal LV function; mild LVH; restrictive filling; moderate MR;    severe LAE; mild TR with moderate pulmonary hypertension.  Epic records are reviewed at length today  CHA2DS2-VASc Score = 3  The patient's score is based upon: CHF History: 0 HTN History: 1 Diabetes History: 0 Stroke History: 0 Vascular Disease History: 0 Age Score: 1 Gender Score: 1       ASSESSMENT AND PLAN: 1. Persistent Atrial Fibrillation/atrial flutter The patient's CHA2DS2-VASc score is 3, indicating a 3.2% annual risk of stroke.   Patient remains in rapid atrial flutter.  Will arrange for DCCV ASAP Continue amiodarone 200 mg BID. She has an appointment with Dr Curt Bears 4/3 to discuss possible repeat ablation.  Continue diltiazem 240 mg daily Continue Toprol 75 mg BID Continue Eliquis 5 mg BID Discussed ED precautions for unstable symptoms. Hopefully, we will be able  to get her cardioverted outpatient expeditiously.   2. Secondary Hypercoagulable State (ICD10:  D68.69) The patient is at significant risk for stroke/thromboembolism based upon her CHA2DS2-VASc Score of 3.  Continue Apixaban (Eliquis).  3. Obesity Body mass index is 35.61 kg/m. Lifestyle modification was discussed and encouraged including regular physical activity and weight reduction.  4. Obstructive sleep apnea Followed by Dr Radford Pax. Patient reports compliance with CPAP therapy.  5. HTN Stable, no changes today.   Follow up with Dr Curt Bears as scheduled.    [facility] [address] [place], [place] [address] [phone] 04/16/2021 11:50 AM

## 2021-04-16 NOTE — Addendum Note (Signed)
Encounter addended by: Juluis Mire, RN on: 04/16/2021 2:04 PM ? Actions taken: Order list changed

## 2021-04-17 ENCOUNTER — Other Ambulatory Visit (HOSPITAL_COMMUNITY): Payer: Medicare Other | Admitting: Physician Assistant

## 2021-04-17 ENCOUNTER — Encounter (HOSPITAL_COMMUNITY): Payer: Self-pay | Admitting: Emergency Medicine

## 2021-04-17 ENCOUNTER — Ambulatory Visit (HOSPITAL_COMMUNITY): Admission: RE | Admit: 2021-04-17 | Payer: Medicare Other | Source: Home / Self Care | Admitting: Cardiology

## 2021-04-17 ENCOUNTER — Telehealth (HOSPITAL_COMMUNITY): Payer: Self-pay | Admitting: *Deleted

## 2021-04-17 ENCOUNTER — Encounter (HOSPITAL_COMMUNITY): Admission: RE | Payer: Self-pay | Source: Home / Self Care

## 2021-04-17 ENCOUNTER — Emergency Department (HOSPITAL_COMMUNITY)
Admission: EM | Admit: 2021-04-17 | Discharge: 2021-04-17 | Disposition: A | Discharge status: Home / Self Care | Payer: Medicare Other | Attending: Emergency Medicine | Admitting: Emergency Medicine

## 2021-04-17 ENCOUNTER — Other Ambulatory Visit: Payer: Self-pay

## 2021-04-17 DIAGNOSIS — I4892 Unspecified atrial flutter: Secondary | ICD-10-CM | POA: Diagnosis not present

## 2021-04-17 DIAGNOSIS — Z7901 Long term (current) use of anticoagulants: Secondary | ICD-10-CM | POA: Insufficient documentation

## 2021-04-17 DIAGNOSIS — R0682 Tachypnea, not elsewhere classified: Secondary | ICD-10-CM | POA: Diagnosis not present

## 2021-04-17 DIAGNOSIS — R000 Tachycardia, unspecified: Secondary | ICD-10-CM | POA: Diagnosis present

## 2021-04-17 LAB — CBC
HCT: 45.8 % (ref 36.0–46.0)
Hemoglobin: 14.7 g/dL (ref 12.0–15.0)
MCH: 27.6 pg (ref 26.0–34.0)
MCHC: 32.1 g/dL (ref 30.0–36.0)
MCV: 85.9 fL (ref 80.0–100.0)
Platelets: 283 10*3/uL (ref 150–400)
RBC: 5.33 MIL/uL — ABNORMAL HIGH (ref 3.87–5.11)
RDW: 15.9 % — ABNORMAL HIGH (ref 11.5–15.5)
WBC: 8.5 10*3/uL (ref 4.0–10.5)
nRBC: 0 % (ref 0.0–0.2)

## 2021-04-17 LAB — BASIC METABOLIC PANEL
Anion gap: 10 (ref 5–15)
BUN: 17 mg/dL (ref 8–23)
CO2: 24 mmol/L (ref 22–32)
Calcium: 9.8 mg/dL (ref 8.9–10.3)
Chloride: 105 mmol/L (ref 98–111)
Creatinine, Ser: 1.22 mg/dL — ABNORMAL HIGH (ref 0.44–1.00)
GFR, Estimated: 48 mL/min — ABNORMAL LOW (ref >60)
Glucose, Bld: 109 mg/dL — ABNORMAL HIGH (ref 70–99)
Potassium: 4.4 mmol/L (ref 3.5–5.1)
Sodium: 139 mmol/L (ref 135–145)

## 2021-04-17 LAB — MAGNESIUM: Magnesium: 1.8 mg/dL (ref 1.7–2.4)

## 2021-04-17 SURGERY — CARDIOVERSION
Anesthesia: General

## 2021-04-17 MED ORDER — PROPOFOL 10 MG/ML IV BOLUS
INTRAVENOUS | Status: AC | PRN
  Administered 2021-04-17: 97.1 mg via INTRAVENOUS

## 2021-04-17 MED ORDER — PROPOFOL 10 MG/ML IV BOLUS
50.0000 mg | Freq: Once | INTRAVENOUS | Status: DC
  Filled 2021-04-17: qty 20

## 2021-04-17 MED ORDER — SODIUM CHLORIDE 0.9 % IV SOLN: INTRAVENOUS | Status: DC

## 2021-04-17 NOTE — ED Notes (Signed)
Consent signed for pt's cardioversion & is at bedside. ?

## 2021-04-17 NOTE — ED Provider Notes (Signed)
[facility] EMERGENCY DEPARTMENT Provider Note   CSN: [id number] Arrival date & time: 04/17/21  1226     History  Chief Complaint  Patient presents with   Tachycardia    Mary Poole is a 71 y.o. female.  HPI Patient presents with dyspnea, tachycardia.  She is felt this way intermittently for years has a history of A-fib, a flutter, takes her Eliquis regularly, and 2 weeks ago had adjustment of her amiodarone dosing.  Yesterday with persistent symptoms she went to the A-fib clinic.  There she was informed that they would schedule her for elective cardioversion.  Today she presents due to persistent symptoms, with worsening discomfort secondary to dyspnea.  She specifically denies pain, fever, syncope.    Home Medications Prior to Admission medications   Medication Sig Start Date End Date Taking? Authorizing Provider  acetaminophen (TYLENOL) 650 MG CR tablet Take 650-1,300 mg by mouth every 8 (eight) hours as needed for pain.    [provider]  amiodarone (PACERONE) 200 MG tablet Take 1 tablet by mouth twice a day then reduce to 1 tablet daily Patient taking differently: Take 1 tablet by mouth twice a day for one month then reduce to 1 tablet daily 04/02/21   Fenton, Clint R, PA  diltiazem (CARDIZEM CD) 240 MG 24 hr capsule Take 1 capsule (240 mg total) by mouth every morning. 03/17/21   Fenton, Clint R, PA  ELIQUIS 5 MG TABS tablet TAKE 1 TABLET BY MOUTH TWICE A DAY 06/16/20   Camnitz, Will Hassell Done, MD  furosemide (LASIX) 20 MG tablet TAKE 1 TABLET BY MOUTH EVERY DAY AS NEEDED FOR SWELLING 08/13/20   Camnitz, Will Hassell Done, MD  losartan (COZAAR) 50 MG tablet TAKE 1 TABLET BY MOUTH EVERY DAY 10/17/20   Camnitz, Ocie Doyne, MD  metoprolol succinate (TOPROL-XL) 50 MG 24 hr tablet Take 1.5 tablets (75 mg total) by mouth 2 (two) times daily. 04/09/21   Fenton, Clint R, PA  Multiple Vitamin (MULTIVITAMIN) LIQD Take 30 mLs by mouth daily. Nutriburst    [provider]  omeprazole (PRILOSEC) 40 MG capsule TAKE 1 CAPSULE (40 MG TOTAL) BY MOUTH DAILY. 04/16/21   Luetta Nutting, DO  PRESCRIPTION MEDICATION Inhale into the lungs at bedtime. CPAP    [provider]      Allergies    Diphenhydramine hcl and Flecainide    Review of Systems   Review of Systems  Constitutional:        Per HPI, otherwise negative  HENT:         Per HPI, otherwise negative  Respiratory:         Per HPI, otherwise negative  Cardiovascular:        Per HPI, otherwise negative  Gastrointestinal:  Negative for vomiting.  Endocrine:       Negative aside from HPI  Genitourinary:        Neg aside from HPI   Musculoskeletal:        Per HPI, otherwise negative  Skin: Negative.   Neurological:  Negative for syncope.   Physical Exam Updated Vital Signs BP (!) 153/118    Pulse (!) 124    Temp 97.6 F (36.4 C) (Oral)    Resp 20    SpO2 100%  Physical Exam Vitals and nursing note reviewed.  Constitutional:      General: She is not in acute distress.    Appearance: She is well-developed.  HENT:     Head: Normocephalic  and atraumatic.  Eyes:     Conjunctiva/sclera: Conjunctivae normal.  Cardiovascular:     Rate and Rhythm: Regular rhythm. Tachycardia present.  Pulmonary:     Effort: Tachypnea present.     Breath sounds: Decreased air movement present. No stridor.  Abdominal:     General: There is no distension.  Skin:    General: Skin is warm and dry.  Neurological:     Mental Status: She is alert and oriented to person, place, and time.     Cranial Nerves: No cranial nerve deficit.    ED Results / Procedures / Treatments   Labs (all labs ordered are listed, but only abnormal results are displayed) Labs Reviewed  BASIC METABOLIC PANEL  MAGNESIUM  CBC    EKG None  Radiology No results found.  Procedures .Cardioversion  Date/Time: 04/17/2021 1:26 PM Performed by: Carmin Muskrat, MD Authorized by: Carmin Muskrat, MD   Consent:    Consent  given by:  Patient   Risks discussed:  Induced arrhythmia, death and pain   Alternatives discussed:  No treatment, delayed treatment and rate-control medication Universal protocol:    Procedure explained and questions answered to patient or proxy's satisfaction: yes     Relevant documents present and verified: yes     Test results available and properly labeled: yes     Required blood products, implants, devices, and special equipment available: yes     Site/side marked: yes     Immediately prior to procedure a time out was called: yes     Patient identity confirmed:  Verbally with patient Pre-procedure details:    Cardioversion basis:  Emergent   Rhythm:  Atrial flutter   Electrode placement:  Anterior-posterior Patient sedated: Yes. Refer to sedation procedure documentation for details of sedation.  Attempt one:    Cardioversion mode:  Synchronous   Waveform:  Biphasic   Shock (Joules):  120   Shock outcome:  Conversion to normal sinus rhythm Post-procedure details:    Patient status:  Awake   Patient tolerance of procedure:  Tolerated well, no immediate complications .Sedation  Date/Time: 04/17/2021 1:27 PM Performed by: Carmin Muskrat, MD Authorized by: Carmin Muskrat, MD   Consent:    Consent obtained:  Verbal   Consent given by:  Patient   Risks discussed:  Dysrhythmia, inadequate sedation and nausea   Alternatives discussed:  Analgesia without sedation Universal protocol:    Procedure explained and questions answered to patient or proxy's satisfaction: yes     Relevant documents present and verified: yes     Test results available: yes     Required blood products, implants, devices, and special equipment available: yes     Site/side marked: yes     Immediately prior to procedure, a time out was called: yes     Patient identity confirmed:  Verbally with patient Indications:    Procedure performed:  Cardioversion   Procedure necessitating sedation performed by:   Physician performing sedation Pre-sedation assessment:    Time since last food or drink:  5   ASA classification: class 2 - patient with mild systemic disease     Mouth opening:  2 finger widths   Mallampati score:  II - soft palate, uvula, fauces visible   Neck mobility: reduced     Pre-sedation assessments completed and reviewed: airway patency, cardiovascular function, hydration status, mental status, nausea/vomiting, pain level, respiratory function and temperature     Pre-sedation assessment completed:  04/17/2021 12:45 PM Immediate pre-procedure details:  Reassessment: Patient reassessed immediately prior to procedure     Reviewed: vital signs     Verified: bag valve mask available, emergency equipment available, intubation equipment available, IV patency confirmed, oxygen available, reversal medications available and suction available   Procedure details (see MAR for exact dosages):    Preoxygenation:  Nasal cannula   Sedation:  Propofol   Intended level of sedation: deep   Intra-procedure monitoring:  Continuous capnometry, continuous pulse oximetry, cardiac monitor, blood pressure monitoring, frequent LOC assessments and frequent vital sign checks   Intra-procedure events: none     Total Provider sedation time (minutes):  20 Post-procedure details:    Post-sedation assessment completed:  04/17/2021 1:28 PM   Attendance: Constant attendance by certified staff until patient recovered     Recovery: Patient returned to pre-procedure baseline     Post-sedation assessments completed and reviewed: airway patency, cardiovascular function, hydration status, mental status, nausea/vomiting, pain level, respiratory function and temperature     Patient is stable for discharge or admission: yes     Procedure completion:  Tolerated well, no immediate complications    Medications Ordered in ED Medications  0.9 %  sodium chloride infusion (has no administration in time range)  propofol  (DIPRIVAN) 10 mg/mL bolus/IV push 50 mg (has no administration in time range)    ED Course/ Medical Decision Making/ A&P  This patient presents to the ED for concern of tachypnea, tachycardia, this involves an extensive number of treatment options, and is a complaint that carries with it a high risk of complications and morbidity.  The differential diagnosis includes A-fib/flutter, new respiratory ailment, congestive heart disease, electrolyte abnormalities   Co morbidities that complicate the patient evaluation  History of A-fib, obesity   Social Determinants of Health:  Obesity, age   Additional history obtained:  Additional history and/or information obtained from chart review External records from outside source obtained and reviewed including A-fib clinic notes from yesterday with the patient's history of A-fib, recent medication adjustments, and recommendation for elective cardioversion which was tentatively scheduled for today, but patient is here rather than there.   After the initial evaluation, orders, including: Labs were initiated.  Patient placed on Cardiac and Pulse-Oximetry Monitors. The patient was maintained on a cardiac monitor.  The cardiac monitored showed an rhythm of a flutter variable conducted beats, AT/130, abnormal The patient was also maintained on pulse oximetry. The readings were typically 99% with nasal cannula abnormal                          On repeat evaluation of the patient improved  Lab Tests:  I personally interpreted labs.  The pertinent results include: Unremarkable labs    Dispostion / Final MDM:  After consideration of the diagnostic results and the patient's response to treatment, fully recovered from cardioversion, with sedation she is appropriate for discharge.  Adult female with a history of longstanding A-fib presents with worsening symptoms, is found to have atrial flutter with rapid rate, requiring cardioversion, which was  discussed at length, and after consent was obtained the patient had successful procedural sedation with cardioversion, no complications.  No evidence for concurrent new phenomena, patient discharged to follow-up with cardiology clinic, going anticoagulant and rate control meds.  Final Clinical Impression(s) / ED Diagnoses Final diagnoses:  Episodic atrial flutter ([place])   CRITICAL CARE Performed by: Carmin Muskrat Total critical care time: 35 minutes Critical care time was exclusive of separately billable procedures  and treating other patients. Critical care was necessary to treat or prevent imminent or life-threatening deterioration. Critical care was time spent personally by me on the following activities: development of treatment plan with patient and/or surrogate as well as nursing, discussions with consultants, evaluation of patient's response to treatment, examination of patient, obtaining history from patient or surrogate, ordering and performing treatments and interventions, ordering and review of laboratory studies, ordering and review of radiographic studies, pulse oximetry and re-evaluation of patient's condition.    Carmin Muskrat, MD 04/17/21 [phone]

## 2021-04-17 NOTE — Telephone Encounter (Signed)
Patient called in this morning prior to arrival for scheduled cardioversion stating she was notified she will need to pay $295 for her "outpatient cardioversion" because it is coded as outpatient surgical.  Explained to patient that outpatient cardioversion are performed in the procedural area of the hospital under anesthesia. Encouraged patient she should keep the scheduled cardioversion as when we saw her in the office yesterday her HRs continue in the 120-140s despite loading Amiodarone and increase in Metoprolol. Pt adamantly refused stating she has never had to pay for a cardioversion "like that" before and will go to the ER instead. Educated patient on urgent versus elective cardioversion and since we were able to secure her an outpatient spot this would be recommended. Again patient stated she is not going to pay for an outpatient cardioversion and believes the ER is her best option. Encouraged pt to seek care as her HRs are unstable pt states she will do what she feels is best of her and it was not spending the copay for the outpatient cardioversion. Pt to follow up with Dr. [name].  ?

## 2021-04-17 NOTE — Discharge Instructions (Signed)
As discussed, your evaluation today has been largely reassuring.  But, it is important that you monitor your condition carefully, and do not hesitate to return to the ED if you develop new, or concerning changes in your condition. ? ?Otherwise, please follow-up with your physician for appropriate ongoing care. ? ?

## 2021-04-17 NOTE — ED Triage Notes (Signed)
Patient coming from home, complaint of elevated heart rate. Pt with hx of afib. On blood thinner for same.  ?

## 2021-04-23 ENCOUNTER — Ambulatory Visit (HOSPITAL_COMMUNITY): Payer: Medicare Other | Admitting: Physician Assistant

## 2021-04-27 ENCOUNTER — Telehealth: Payer: Self-pay | Admitting: Cardiology

## 2021-04-27 NOTE — Telephone Encounter (Signed)
Patient c/o Palpitations:  High priority if patient c/o lightheadedness, shortness of breath, or chest pain ? ?How long have you had palpitations/irregular HR/ Afib? Are you having the symptoms now? For 6 weeks ? ?Are you currently experiencing lightheadedness, SOB or CP? no ? ?Do you have a history of afib (atrial fibrillation) or irregular heart rhythm? Yes afib ? ?Have you checked your BP or HR? (document readings if available): has not checked today. States that her HR is resting between 122-127 but not sure of readings today. BP cuff may be broken per patient.  ? ?Are you experiencing any other symptoms? No - states she has been working with the afib clinic and it is not working. She is scheduled to see Dr. [name] on 04/03 but wants to be seen sooner, does not want to see APP. Added patient to Dr. [name] waitlist.   ?

## 2021-04-27 NOTE — Telephone Encounter (Signed)
Spent 17 min on phone with patient ?Followed up with pt who reports HRs still avg 120s at home, ?Currently no symptoms associated with elevated HRs. ?Compliant with Toprol 75 mg BID & Diltiazem 240 once daily and Eliquis once daily. ?Scheduled for f/u w/ Camnitz this week to further discuss treatment plan.  ?Pt agreeable to plan. ?

## 2021-04-29 ENCOUNTER — Encounter: Payer: Self-pay | Admitting: Cardiology

## 2021-04-29 ENCOUNTER — Other Ambulatory Visit: Payer: Self-pay

## 2021-04-29 ENCOUNTER — Other Ambulatory Visit (HOSPITAL_COMMUNITY): Payer: Self-pay | Admitting: Physician Assistant

## 2021-04-29 ENCOUNTER — Ambulatory Visit: Payer: Medicare Other | Admitting: Cardiology

## 2021-04-29 VITALS — BP 148/98 | HR 98 | Ht 65.0 in | Wt 215.2 lb

## 2021-04-29 DIAGNOSIS — I4819 Other persistent atrial fibrillation: Secondary | ICD-10-CM

## 2021-04-29 DIAGNOSIS — D6869 Other thrombophilia: Secondary | ICD-10-CM

## 2021-04-29 NOTE — Progress Notes (Signed)
? ?Electrophysiology Office Note ? ? ?Date:  04/29/2021  ? ?ID:  Mary Poole, DOB 12/12/1950, MRN [id number] ? ?PCP:  Luetta Nutting, DO  ?Cardiologist:  Tamala Julian ?Primary Electrophysiologist:  Emerson Barretto Meredith Leeds, MD   ? ?No chief complaint on file. ? ?  ?History of Present Illness: ?Mary Poole is a 71 y.o. female who is being seen today for the evaluation of atrial flutter at the request of Luetta Nutting, DO. Presenting today for electrophysiology evaluation.  ? ?She has a history significant for hypertension, palpitations, atrial fibrillation.  She was diagnosed with atrial fibrillation in 2018.  She went into a wide-complex tachycardia which was thought atrial flutter with one-to-one conduction.  She is now status post atrial fibrillation ablation 11/09/2017.  Unfortunately, she has gone back into atrial fibrillation. ? ?Today, denies symptoms of palpitations, chest pain, shortness of breath, orthopnea, PND, lower extremity edema, claudication, dizziness, presyncope, syncope, bleeding, or neurologic sequela. The patient is tolerating medications without difficulties.  Currently she feels mild palpitations and some shortness of breath.  She is unfortunately continued to have episodes of atrial flutter despite cardioversion in the emergency room.  She is currently on amiodarone but this is not controlling her arrhythmia.  She would like to get back into normal rhythm. ? ? ?Past Medical History:  ?Diagnosis Date  ? Anxiety   ? Atrial fibrillation ([place])   ? Complication of anesthesia   ? "woke up during colonoscopy 02/2017" (11/09/2017)  ? GERD (gastroesophageal reflux disease)   ? H/O atrial flutter   ? History of blood transfusion 02/2017  ? LGIB (11/09/2017)  ? Hypertension   ? Migraine   ? "a few/year" (11/09/2017)  ? OSA on CPAP   ? ?Past Surgical History:  ?Procedure Laterality Date  ? ATRIAL FIBRILLATION ABLATION N/A 11/09/2017  ? Procedure: ATRIAL FIBRILLATION ABLATION;  Surgeon: Constance Haw, MD;   Location: [place] [facility];  Service: Cardiovascular;  Laterality: N/A;  ? COLONOSCOPY N/A 02/24/2017  ? Procedure: COLONOSCOPY;  Surgeon: Carol Ada, MD;  Location: [place];  Service: Gastroenterology;  Laterality: N/A;  ? ENTEROSCOPY N/A 02/24/2017  ? Procedure: ENTEROSCOPY;  Surgeon: Carol Ada, MD;  Location: [place];  Service: Gastroenterology;  Laterality: N/A;  ? HERNIA REPAIR  1960s  ? WISDOM TOOTH EXTRACTION    ? ? ? ?Current Outpatient Medications  ?Medication Sig Dispense Refill  ? acetaminophen (TYLENOL) 650 MG CR tablet Take 650-1,300 mg by mouth every 8 (eight) hours as needed for pain.    ? amiodarone (PACERONE) 200 MG tablet Take 1 tablet (200 mg total) by mouth daily. Take 1 tablet by mouth twice a day then reduce to 1 tablet dail 30 tablet 3  ? diltiazem (CARDIZEM CD) 240 MG 24 hr capsule Take 1 capsule (240 mg total) by mouth every morning. 30 capsule 3  ? ELIQUIS 5 MG TABS tablet TAKE 1 TABLET BY MOUTH TWICE A DAY 60 tablet 5  ? furosemide (LASIX) 20 MG tablet TAKE 1 TABLET BY MOUTH EVERY DAY AS NEEDED FOR SWELLING 90 tablet 3  ? losartan (COZAAR) 50 MG tablet TAKE 1 TABLET BY MOUTH EVERY DAY 90 tablet 2  ? metoprolol succinate (TOPROL-XL) 50 MG 24 hr tablet Take 1.5 tablets (75 mg total) by mouth 2 (two) times daily. 60 tablet 3  ? Multiple Vitamin (MULTIVITAMIN) LIQD Take 30 mLs by mouth daily. Nutriburst    ? omeprazole (PRILOSEC) 40 MG capsule TAKE 1 CAPSULE (40 MG TOTAL) BY MOUTH DAILY.  90 capsule 3  ? PRESCRIPTION MEDICATION Inhale into the lungs at bedtime. CPAP    ? ?No current facility-administered medications for this visit.  ? ? ?Allergies:   Diphenhydramine hcl and Flecainide  ? ?Social History:  The patient  reports that she has quit smoking. Her smoking use included cigarettes. She has a 2.50 pack-year smoking history. She has never used smokeless tobacco. She reports that she does not drink alcohol and does not use drugs.  ? ?Family History:  The patient's family  history includes AAA (abdominal aortic aneurysm) in her sister; Allergies in an other family member; CAD in her father; Clotting disorder in an other family member; Heart disease in an other family member; Hypertension in an other family member; Migraines in an other family member; Rheum arthritis in an other family member; Thyroid disease in her sister.  ? ?ROS:  Please see the history of present illness.   Otherwise, review of systems is positive for none.   All other systems are reviewed and negative.  ? ?PHYSICAL EXAM: ?VS:  BP (!) 148/98   Pulse 98   Ht 5\' 5"  (1.651 m)   Wt 215 lb 3.2 oz (97.6 kg)   SpO2 96%   BMI 35.81 kg/m?  , BMI Body mass index is 35.81 kg/m?. ?GEN: Well nourished, well developed, in no acute distress  ?HEENT: normal  ?Neck: no JVD, carotid bruits, or masses ?Cardiac: tachycardic, regular; no murmurs, rubs, or gallops,no edema  ?Respiratory:  clear to auscultation bilaterally, normal work of breathing ?GI: soft, nontender, nondistended, + BS ?MS: no deformity or atrophy  ?Skin: warm and dry ?Neuro:  Strength and sensation are intact ?Psych: euthymic mood, full affect ? ?EKG:  EKG is not ordered today. ?Personal review of the ekg ordered 04/16/21 shows atrial flutter ? ?Recent Labs: ?03/11/2021: TSH 0.636 ?03/12/2021: ALT 40 ?04/17/2021: BUN 17; Creatinine, Ser 1.22; Hemoglobin 14.7; Magnesium 1.8; Platelets 283; Potassium 4.4; Sodium 139  ? ? ?Lipid Panel  ?No results found for: CHOL, TRIG, HDL, CHOLHDL, VLDL, LDLCALC, LDLDIRECT ? ? ?Wt Readings from Last 3 Encounters:  ?04/29/21 215 lb 3.2 oz (97.6 kg)  ?04/16/21 214 lb (97.1 kg)  ?04/02/21 222 lb 9.6 oz (101 kg)  ?  ? ? ?Other studies Reviewed: ?Additional studies/ records that were reviewed today include: Myoview 10/06/16  ?Review of the above records today demonstrates:  ?Nuclear stress EF: 58%. The left ventricular ejection fraction is normal (55-65%). ?The study is normal. There is breast attenuation. No ischemia . no evidence of  infarction ?This is a low risk study. ? ?Holter 10/09/16 - personally reviewed ?Basic rhythm is NSR rare PAC's and PVC's. ?Brief SVT < 10 beats. ?Heart rate range 43-113 bpm with average 63 bpm. ?  ?NSR ?Normal study ? ?TTE 07/19/16 ?LEFT VENTRICLE ?The left ventricle is borderline dilated. There is normal left ventricular  ?wall thickness. Left ventricular systolic function is normal. LV ejection  ?fraction = 50-55%. Left ventricular filling pattern is indeterminate. No  ?segmental wall motion abnormalities seen in the left ventricle. ?LEFT ATRIUM ?The left atrium is mildly dilated. ?AORTIC VALVE ?The aortic valve is not well visualized. There is no aortic regurgitation. ? ? ? ?ASSESSMENT AND PLAN: ? ?1.  Paroxysmal atrial fibrillation/flutter: Currently on Eliquis, diltiazem, metoprolol.  CHA2DS2-VASc of 3.  Status post ablation 10-19.  Unfortunately she is in atrial flutter today.  She would like to get back into normal rhythm.  She did have a cardioversion in the emergency room, but  has gone back into atrial flutter.  She is currently on amiodarone but this is not controlling her arrhythmia.  We Uvaldo Rybacki plan for ablation. ? ?Risk, benefits, and alternatives to EP study and radiofrequency ablation for afib were also discussed in detail today. These risks include but are not limited to stroke, bleeding, vascular damage, tamponade, perforation, damage to the esophagus, lungs, and other structures, pulmonary vein stenosis, worsening renal function, and death. The patient understands these risk and wishes to proceed.  We Amzie Sillas therefore proceed with catheter ablation at the next available time.  Carto, ICE, anesthesia are requested for the procedure.  Khrista Braun also obtain CT PV protocol prior to the procedure to exclude LAA thrombus and further evaluate atrial anatomy.  ? ?2.  Hypertension: Elevated today.  We Dosha Broshears recheck when she is in sinus rhythm. ? ?3.  Secondary hypercoagulable state: Currently on Eliquis for atrial  fibrillation as above ? ?4.  Obstructive sleep apnea: CPAP compliance encouraged ? ?5.  Obesity: Body mass index is 35.81 kg/m?. ?Diet and exercise encouraged ? ? ?Current medicines are reviewed at length with the patient t

## 2021-04-29 NOTE — Patient Instructions (Signed)
Medication Instructions:  ?Your physician recommends that you continue on your current medications as directed. Please refer to the Current Medication list given to you today. ?*If you need a refill on your cardiac medications before your next appointment, please call your pharmacy* ? ?Lab Work: ?None ordered. ?If you have labs (blood work) drawn today and your tests are completely normal, you will receive your results only by: ?MyChart Message (if you have MyChart) OR ?A paper copy in the mail ?If you have any lab test that is abnormal or we need to change your treatment, we will call you to review the results. ? ?Testing/Procedures: ?Your physician has requested that you have cardiac CT. Cardiac computed tomography (CT) is a painless test that uses an x-ray machine to take clear, detailed pictures of your heart.  ? ?Your physician has recommended that you have an ablation. Catheter ablation is a medical procedure used to treat some cardiac arrhythmias (irregular heartbeats). During catheter ablation, a long, thin, flexible tube is put into a blood vessel in your groin (upper thigh), or neck. This tube is called an ablation catheter. It is then guided to your heart through the blood vessel. Radio frequency waves destroy small areas of heart tissue where abnormal heartbeats may cause an arrhythmia to start. Please see the instruction sheet given to you today. ? ?Follow-Up: ?At [facility], you and your health needs are our priority.  As part of our continuing mission to provide you with exceptional heart care, we have created designated Provider Care Teams.  These Care Teams include your primary Cardiologist (physician) and Advanced Practice Providers (APPs -  Physician Assistants and Nurse Practitioners) who all work together to provide you with the care you need, when you need it. ? ?Please let me know what date will work for you for an ablation: ? ?June 23, 28, 30 ? ?Cardiac Ablation ?Cardiac ablation is a  procedure to destroy, or ablate, a small amount of heart tissue in very specific places. The heart has many electrical connections. Sometimes these connections are abnormal and can cause the heart to beat very fast or irregularly. Ablating some of the areas that cause problems can improve the heart's rhythm or return it to normal. Ablation may be done for people who: ?Have Wolff-Parkinson-White syndrome. ?Have fast heart rhythms (tachycardia). ?Have taken medicines for an abnormal heart rhythm (arrhythmia) that were not effective or caused side effects. ?Have a high-risk heartbeat that may be life-threatening. ?During the procedure, a small incision is made in the neck or the groin, and a long, thin tube (catheter) is inserted into the incision and moved to the heart. Small devices (electrodes) on the tip of the catheter will send out electrical currents. A type of X-ray (fluoroscopy) will be used to help guide the catheter and to provide images of the heart. ?Tell a health care provider about: ?Any allergies you have. ?All medicines you are taking, including vitamins, herbs, eye drops, creams, and over-the-counter medicines. ?Any problems you or family members have had with anesthetic medicines. ?Any blood disorders you have. ?Any surgeries you have had. ?Any medical conditions you have, such as kidney failure. ?Whether you are pregnant or may be pregnant. ?What are the risks? ?Generally, this is a safe procedure. However, problems may occur, including: ?Infection. ?Bruising and bleeding at the catheter insertion site. ?Bleeding into the chest, especially into the sac that surrounds the heart. This is a serious complication. ?Stroke or blood clots. ?Damage to nearby structures or organs. ?Allergic  reaction to medicines or dyes. ?Need for a permanent pacemaker if the normal electrical system is damaged. A pacemaker is a small computer that sends electrical signals to the heart and helps your heart beat  normally. ?The procedure not being fully effective. This may not be recognized until months later. Repeat ablation procedures are sometimes done. ?What happens before the procedure? ?Medicines ?Ask your health care provider about: ?Changing or stopping your regular medicines. This is especially important if you are taking diabetes medicines or blood thinners. ?Taking medicines such as aspirin and ibuprofen. These medicines can thin your blood. Do not take these medicines unless your health care provider tells you to take them. ?Taking over-the-counter medicines, vitamins, herbs, and supplements. ?General instructions ?Follow instructions from your health care provider about eating or drinking restrictions. ?Plan to have someone take you home from the hospital or clinic. ?If you will be going home right after the procedure, plan to have someone with you for 24 hours. ?Ask your health care provider what steps will be taken to prevent infection. ?What happens during the procedure? ? ?An IV will be inserted into one of your veins. ?You will be given a medicine to help you relax (sedative). ?The skin on your neck or groin will be numbed. ?An incision will be made in your neck or your groin. ?A needle will be inserted through the incision and into a large vein in your neck or groin. ?A catheter will be inserted into the needle and moved to your heart. ?Dye may be injected through the catheter to help your surgeon see the area of the heart that needs treatment. ?Electrical currents will be sent from the catheter to ablate heart tissue in desired areas. There are three types of energy that may be used to do this: ?Heat (radiofrequency energy). ?Laser energy. ?Extreme cold (cryoablation). ?When the tissue has been ablated, the catheter will be removed. ?Pressure will be held on the insertion area to prevent a lot of bleeding. ?A bandage (dressing) will be placed over the insertion area. ?The exact procedure may vary among  health care providers and hospitals. ?What happens after the procedure? ?Your blood pressure, heart rate, breathing rate, and blood oxygen level will be monitored until you leave the hospital or clinic. ?Your insertion area will be monitored for bleeding. You will need to lie still for a few hours to ensure that you do not bleed from the insertion area. ?Do not drive for 24 hours or as long as told by your health care provider. ?Summary ?Cardiac ablation is a procedure to destroy, or ablate, a small amount of heart tissue using an electrical current. This procedure can improve the heart rhythm or return it to normal. ?Tell your health care provider about any medical conditions you may have and all medicines you are taking to treat them. ?This is a safe procedure, but problems may occur. Problems may include infection, bruising, damage to nearby organs or structures, or allergic reactions to medicines. ?Follow your health care provider's instructions about eating and drinking before the procedure. You may also be told to change or stop some of your medicines. ?After the procedure, do not drive for 24 hours or as long as told by your health care provider. ?This information is not intended to replace advice given to you by your health care provider. Make sure you discuss any questions you have with your health care provider. ?Document Revised: 12/04/2018 Document Reviewed: 12/04/2018 ?Elsevier Patient Education ? [employer]. ? ?

## 2021-04-30 ENCOUNTER — Telehealth: Payer: Self-pay

## 2021-04-30 ENCOUNTER — Encounter: Payer: Self-pay | Admitting: Cardiology

## 2021-04-30 DIAGNOSIS — I4891 Unspecified atrial fibrillation: Secondary | ICD-10-CM

## 2021-05-01 MED ORDER — APIXABAN 5 MG PO TABS
5.0000 mg | ORAL_TABLET | Freq: Two times a day (BID) | ORAL | 5 refills | Status: DC
Start: 2021-05-01 — End: 2021-05-12

## 2021-05-01 NOTE — Telephone Encounter (Signed)
Pt has been scheduled for afib ablation 08/05/21 ? ?Labs scheduled for 07/16/21 ? ?All orders entered ? ?Instruction letter updated and mailed to patient ? ?Work up complete ?

## 2021-05-11 ENCOUNTER — Ambulatory Visit: Payer: Medicare Other | Admitting: Cardiology

## 2021-05-12 ENCOUNTER — Other Ambulatory Visit: Payer: Self-pay | Admitting: Cardiology

## 2021-05-12 NOTE — Telephone Encounter (Signed)
Pt last saw Dr Curt Bears 04/29/21, last labs 04/17/21 Creat 1.22, age 71, weight 97.6kg, based on specified criteria pt is on appropriate dosage of Eliquis 5mg  BID for afib.  Will refill rx.  ?

## 2021-05-13 ENCOUNTER — Encounter (HOSPITAL_COMMUNITY): Payer: Self-pay

## 2021-05-14 ENCOUNTER — Ambulatory Visit: Payer: Medicare Other | Admitting: Cardiology

## 2021-06-09 ENCOUNTER — Other Ambulatory Visit: Payer: Self-pay | Admitting: Cardiology

## 2021-06-13 ENCOUNTER — Other Ambulatory Visit: Payer: Self-pay | Admitting: Cardiology

## 2021-06-15 ENCOUNTER — Other Ambulatory Visit (HOSPITAL_COMMUNITY): Payer: Self-pay | Admitting: *Deleted

## 2021-06-15 MED ORDER — METOPROLOL SUCCINATE ER 50 MG PO TB24: 75.0000 mg | ORAL_TABLET | Freq: Two times a day (BID) | ORAL | 3 refills | Status: DC

## 2021-07-16 ENCOUNTER — Other Ambulatory Visit: Payer: Medicare Other

## 2021-07-16 DIAGNOSIS — I4891 Unspecified atrial fibrillation: Secondary | ICD-10-CM | POA: Diagnosis not present

## 2021-07-16 LAB — BASIC METABOLIC PANEL
BUN/Creatinine Ratio: 17 (ref 12–28)
BUN: 19 mg/dL (ref 8–27)
CO2: 26 mmol/L (ref 20–29)
Calcium: 9.9 mg/dL (ref 8.7–10.3)
Chloride: 103 mmol/L (ref 96–106)
Creatinine, Ser: 1.11 mg/dL — ABNORMAL HIGH (ref 0.57–1.00)
Glucose: 100 mg/dL — ABNORMAL HIGH (ref 70–99)
Potassium: 4.5 mmol/L (ref 3.5–5.2)
Sodium: 138 mmol/L (ref 134–144)
eGFR: 53 mL/min/{1.73_m2} — ABNORMAL LOW (ref >59)

## 2021-07-16 LAB — CBC WITH DIFFERENTIAL/PLATELET
Basophils Absolute: 0 10*3/uL (ref 0.0–0.2)
Basos: 0 %
EOS (ABSOLUTE): 0.2 10*3/uL (ref 0.0–0.4)
Eos: 2 %
Hematocrit: 42.5 % (ref 34.0–46.6)
Hemoglobin: 14.1 g/dL (ref 11.1–15.9)
Lymphocytes Absolute: 3.6 10*3/uL — ABNORMAL HIGH (ref 0.7–3.1)
Lymphs: 38 %
MCH: 27.2 pg (ref 26.6–33.0)
MCHC: 33.2 g/dL (ref 31.5–35.7)
MCV: 82 fL (ref 79–97)
Monocytes Absolute: 1 10*3/uL — ABNORMAL HIGH (ref 0.1–0.9)
Monocytes: 10 %
Neutrophils Absolute: 4.7 10*3/uL (ref 1.4–7.0)
Neutrophils: 50 %
Platelets: 253 10*3/uL (ref 150–450)
RBC: 5.18 x10E6/uL (ref 3.77–5.28)
RDW: 17.5 % — ABNORMAL HIGH (ref 11.7–15.4)
WBC: 9.4 10*3/uL (ref 3.4–10.8)

## 2021-07-28 ENCOUNTER — Telehealth (HOSPITAL_COMMUNITY): Payer: Self-pay | Admitting: *Deleted

## 2021-07-28 NOTE — Telephone Encounter (Signed)
Patient returning call regarding upcoming cardiac imaging study; pt verbalizes understanding of appt date/time, parking situation and where to check in, pre-test NPO status, and verified current allergies; name and call back number provided for further questions should they arise  Larey Brick RN Navigator Cardiac Imaging [facility] [phone] office [phone] cell  Patient to take her daily medications. She is aware to arrive at 1pm.

## 2021-07-28 NOTE — Telephone Encounter (Signed)
Attempted to call patient regarding upcoming cardiac CT appointment. °Left message on voicemail with name and callback number ° °Marriah Sanderlin RN Navigator Cardiac Imaging °[facility] °[phone] Office °[phone] Cell ° °

## 2021-07-29 ENCOUNTER — Ambulatory Visit (HOSPITAL_COMMUNITY)
Admission: RE | Admit: 2021-07-29 | Discharge: 2021-07-29 | Disposition: A | Discharge status: Home / Self Care | Payer: Medicare Other | Source: Ambulatory Visit | Attending: Cardiology | Admitting: Cardiology

## 2021-07-29 DIAGNOSIS — I4891 Unspecified atrial fibrillation: Secondary | ICD-10-CM | POA: Insufficient documentation

## 2021-07-29 MED ORDER — IOHEXOL 350 MG/ML SOLN
100.0000 mL | Freq: Once | INTRAVENOUS | Status: AC | PRN
  Administered 2021-07-29: 100 mL via INTRAVENOUS

## 2021-08-05 ENCOUNTER — Encounter (HOSPITAL_COMMUNITY)
Admission: RE | Disposition: A | Discharge status: Home / Self Care | Payer: Medicare Other | Source: Home / Self Care | Attending: Cardiology

## 2021-08-05 ENCOUNTER — Encounter (HOSPITAL_COMMUNITY): Payer: Self-pay | Admitting: Cardiology

## 2021-08-05 ENCOUNTER — Ambulatory Visit (HOSPITAL_BASED_OUTPATIENT_CLINIC_OR_DEPARTMENT_OTHER): Payer: Medicare Other | Admitting: Certified Registered Nurse Anesthetist

## 2021-08-05 ENCOUNTER — Ambulatory Visit (HOSPITAL_COMMUNITY)
Admission: RE | Admit: 2021-08-05 | Discharge: 2021-08-05 | Disposition: A | Discharge status: Home / Self Care | Payer: Medicare Other | Attending: Cardiology | Admitting: Cardiology

## 2021-08-05 ENCOUNTER — Other Ambulatory Visit: Payer: Self-pay

## 2021-08-05 ENCOUNTER — Ambulatory Visit (HOSPITAL_COMMUNITY): Payer: Medicare Other | Admitting: Certified Registered Nurse Anesthetist

## 2021-08-05 DIAGNOSIS — Z9989 Dependence on other enabling machines and devices: Secondary | ICD-10-CM

## 2021-08-05 DIAGNOSIS — I10 Essential (primary) hypertension: Secondary | ICD-10-CM | POA: Insufficient documentation

## 2021-08-05 DIAGNOSIS — I4892 Unspecified atrial flutter: Secondary | ICD-10-CM | POA: Insufficient documentation

## 2021-08-05 DIAGNOSIS — I4819 Other persistent atrial fibrillation: Secondary | ICD-10-CM | POA: Diagnosis not present

## 2021-08-05 DIAGNOSIS — G4733 Obstructive sleep apnea (adult) (pediatric): Secondary | ICD-10-CM

## 2021-08-05 DIAGNOSIS — Z87891 Personal history of nicotine dependence: Secondary | ICD-10-CM | POA: Insufficient documentation

## 2021-08-05 DIAGNOSIS — I4891 Unspecified atrial fibrillation: Secondary | ICD-10-CM | POA: Diagnosis not present

## 2021-08-05 HISTORY — PX: ATRIAL FIBRILLATION ABLATION: EP1191

## 2021-08-05 LAB — POCT ACTIVATED CLOTTING TIME: Activated Clotting Time: 341 seconds

## 2021-08-05 SURGERY — ATRIAL FIBRILLATION ABLATION
Anesthesia: General

## 2021-08-05 MED ORDER — ONDANSETRON HCL 4 MG/2ML IJ SOLN
INTRAMUSCULAR | Status: DC | PRN
  Administered 2021-08-05: 4 mg via INTRAVENOUS

## 2021-08-05 MED ORDER — SODIUM CHLORIDE 0.9 % IV SOLN: INTRAVENOUS | Status: DC

## 2021-08-05 MED ORDER — HEPARIN SODIUM (PORCINE) 1000 UNIT/ML IJ SOLN
INTRAMUSCULAR | Status: DC | PRN
  Administered 2021-08-05: 14000 [IU] via INTRAVENOUS
  Administered 2021-08-05: 1000 [IU] via INTRAVENOUS

## 2021-08-05 MED ORDER — PROPOFOL 10 MG/ML IV BOLUS
INTRAVENOUS | Status: DC | PRN
  Administered 2021-08-05: 150 mg via INTRAVENOUS

## 2021-08-05 MED ORDER — PHENYLEPHRINE 80 MCG/ML (10ML) SYRINGE FOR IV PUSH (FOR BLOOD PRESSURE SUPPORT)
PREFILLED_SYRINGE | INTRAVENOUS | Status: DC | PRN
  Administered 2021-08-05 (×2): 80 ug via INTRAVENOUS

## 2021-08-05 MED ORDER — DEXAMETHASONE SODIUM PHOSPHATE 10 MG/ML IJ SOLN
INTRAMUSCULAR | Status: DC | PRN
  Administered 2021-08-05: 10 mg via INTRAVENOUS

## 2021-08-05 MED ORDER — ONDANSETRON HCL 4 MG/2ML IJ SOLN: 4.0000 mg | Freq: Four times a day (QID) | INTRAMUSCULAR | Status: DC | PRN

## 2021-08-05 MED ORDER — ACETAMINOPHEN 325 MG PO TABS
ORAL_TABLET | ORAL | Status: AC
  Filled 2021-08-05: qty 2

## 2021-08-05 MED ORDER — HEPARIN SODIUM (PORCINE) 1000 UNIT/ML IJ SOLN
INTRAMUSCULAR | Status: DC | PRN
  Administered 2021-08-05: 1000 [IU] via INTRAVENOUS

## 2021-08-05 MED ORDER — SODIUM CHLORIDE 0.9% FLUSH: 3.0000 mL | INTRAVENOUS | Status: DC | PRN

## 2021-08-05 MED ORDER — ROCURONIUM BROMIDE 10 MG/ML (PF) SYRINGE
PREFILLED_SYRINGE | INTRAVENOUS | Status: DC | PRN
  Administered 2021-08-05: 100 mg via INTRAVENOUS

## 2021-08-05 MED ORDER — SUGAMMADEX SODIUM 200 MG/2ML IV SOLN
INTRAVENOUS | Status: DC | PRN
  Administered 2021-08-05 (×2): 200 mg via INTRAVENOUS

## 2021-08-05 MED ORDER — SODIUM CHLORIDE 0.9 % IV SOLN: 250.0000 mL | INTRAVENOUS | Status: DC | PRN

## 2021-08-05 MED ORDER — HEPARIN (PORCINE) IN NACL 2-0.9 UNITS/ML
INTRAMUSCULAR | Status: AC | PRN
  Administered 2021-08-05 (×4): 500 mL

## 2021-08-05 MED ORDER — PHENYLEPHRINE HCL-NACL 20-0.9 MG/250ML-% IV SOLN
INTRAVENOUS | Status: DC | PRN
  Administered 2021-08-05: 25 ug/min via INTRAVENOUS

## 2021-08-05 MED ORDER — ACETAMINOPHEN 325 MG PO TABS
650.0000 mg | ORAL_TABLET | ORAL | Status: DC | PRN
  Administered 2021-08-05: 650 mg via ORAL

## 2021-08-05 MED ORDER — DOBUTAMINE INFUSION FOR EP/ECHO/NUC (1000 MCG/ML)
INTRAVENOUS | Status: DC | PRN
  Administered 2021-08-05: 10 ug/kg/min via INTRAVENOUS

## 2021-08-05 MED ORDER — FENTANYL CITRATE (PF) 250 MCG/5ML IJ SOLN
INTRAMUSCULAR | Status: DC | PRN
  Administered 2021-08-05: 100 ug via INTRAVENOUS

## 2021-08-05 MED ORDER — PROTAMINE SULFATE 10 MG/ML IV SOLN
INTRAVENOUS | Status: DC | PRN
  Administered 2021-08-05: 10 mg via INTRAVENOUS
  Administered 2021-08-05: 20 mg via INTRAVENOUS
  Administered 2021-08-05: 10 mg via INTRAVENOUS

## 2021-08-05 MED ORDER — LIDOCAINE 2% (20 MG/ML) 5 ML SYRINGE
INTRAMUSCULAR | Status: DC | PRN
  Administered 2021-08-05: 100 mg via INTRAVENOUS

## 2021-08-05 SURGICAL SUPPLY — 21 items
BLANKET WARM UNDERBOD FULL ACC (MISCELLANEOUS) ×3 IMPLANT
CATH 8FR REPROCESSED SOUNDSTAR (CATHETERS) ×2 IMPLANT
CATH 8FR SOUNDSTAR REPROCESSED (CATHETERS) IMPLANT
CATH ABLAT QDOT MICRO BI TC DF (CATHETERS) ×1 IMPLANT
CATH OCTARAY 2.0 F 3-3-3-3-3 (CATHETERS) ×1 IMPLANT
CATH S-M CIRCA TEMP PROBE (CATHETERS) ×1 IMPLANT
CATH WEB BI DIR CSDF CRV REPRO (CATHETERS) ×1 IMPLANT
CLOSURE PERCLOSE PROSTYLE (VASCULAR PRODUCTS) ×4 IMPLANT
COVER SWIFTLINK CONNECTOR (BAG) ×3 IMPLANT
KIT VERSACROSS STEERABLE D1 (CATHETERS) ×1 IMPLANT
MAT PREVALON FULL STRYKER (MISCELLANEOUS) ×1 IMPLANT
PACK EP LATEX FREE (CUSTOM PROCEDURE TRAY) ×2
PACK EP LF (CUSTOM PROCEDURE TRAY) ×2 IMPLANT
PAD DEFIB RADIO PHYSIO CONN (PAD) ×3 IMPLANT
PATCH CARTO3 (PAD) ×1 IMPLANT
SHEATH CARTO VIZIGO SM CVD (SHEATH) ×1 IMPLANT
SHEATH PINNACLE 7F 10CM (SHEATH) ×1 IMPLANT
SHEATH PINNACLE 8F 10CM (SHEATH) ×2 IMPLANT
SHEATH PINNACLE 9F 10CM (SHEATH) ×1 IMPLANT
SHEATH PROBE COVER 6X72 (BAG) ×1 IMPLANT
TUBING SMART ABLATE COOLFLOW (TUBING) ×1 IMPLANT

## 2021-08-05 NOTE — Anesthesia Procedure Notes (Signed)
Procedure Name: Intubation Date/Time: 08/05/2021 11:01 AM  Performed by: Betha Loa, CRNAPre-anesthesia Checklist: Patient identified, Emergency Drugs available, Suction available and Patient being monitored Patient Re-evaluated:Patient Re-evaluated prior to induction Oxygen Delivery Method: Circle System Utilized Preoxygenation: Pre-oxygenation with 100% oxygen Induction Type: IV induction Ventilation: Mask ventilation without difficulty and Oral airway inserted - appropriate to patient size Laryngoscope Size: Mac and 3 Grade View: Grade II Tube type: Oral Tube size: 7.0 mm Number of attempts: 1 Airway Equipment and Method: Stylet and Oral airway Placement Confirmation: ETT inserted through vocal cords under direct vision, positive ETCO2 and breath sounds checked- equal and bilateral Secured at: 21 cm Tube secured with: Tape Dental Injury: Teeth and Oropharynx as per pre-operative assessment

## 2021-08-05 NOTE — Transfer of Care (Signed)
Immediate Anesthesia Transfer of Care Note  Patient: Mary Poole  Procedure(s) Performed: ATRIAL FIBRILLATION ABLATION  Patient Location: PACU and Cath Lab  Anesthesia Type:General  Level of Consciousness: awake, alert , oriented and patient cooperative  Airway & Oxygen Therapy: Patient Spontanous Breathing and Patient connected to nasal cannula oxygen  Post-op Assessment: Report given to RN, Post -op Vital signs reviewed and stable and Patient moving all extremities X 4  Post vital signs: Reviewed and stable  Last Vitals:  Vitals Value Taken Time  BP 126/55 08/05/21 1232  Temp 36.3 C 08/05/21 1232  Pulse 69 08/05/21 1235  Resp 14 08/05/21 1235  SpO2 100 % 08/05/21 1235  Vitals shown include unvalidated device data.  Last Pain:  Vitals:   08/05/21 1232  TempSrc: Temporal  PainSc: 0-No pain         Complications: There were no known notable events for this encounter.

## 2021-08-05 NOTE — Anesthesia Preprocedure Evaluation (Signed)
Anesthesia Evaluation  Patient identified by MRN, date of birth, ID band Patient awake    Reviewed: Allergy & Precautions, NPO status , Patient's Chart, lab work & pertinent test results, reviewed documented beta blocker date and time   History of Anesthesia Complications Negative for: history of anesthetic complications  Airway Mallampati: II  TM Distance: >3 FB Neck ROM: Full    Dental  (+) Partial Upper, Dental Advisory Given, Missing Partial out:   Pulmonary sleep apnea and Continuous Positive Airway Pressure Ventilation , former smoker,    breath sounds clear to auscultation       Cardiovascular hypertension, Pt. on medications and Pt. on home beta blockers (-) angina+ dysrhythmias Atrial Fibrillation  Rhythm:Regular Rate:Bradycardia  8/19 ECHO: EF 55-60%, mod MR, mild TR, mod pulm HTN '18 STRESS: normal perfusion   Neuro/Psych  Headaches, Anxiety    GI/Hepatic Neg liver ROS, GERD  Medicated and Controlled,  Endo/Other    Renal/GU negative Renal ROS     Musculoskeletal   Abdominal (+) + obese,   Peds  Hematology negative hematology ROS (+) xarelto   Anesthesia Other Findings   Reproductive/Obstetrics                             Anesthesia Physical  Anesthesia Plan  ASA: III  Anesthesia Plan: General   Post-op Pain Management: Minimal or no pain anticipated   Induction: Intravenous  PONV Risk Score and Plan: 3 and Ondansetron, Dexamethasone, Midazolam and Treatment may vary due to age or medical condition  Airway Management Planned: Oral ETT  Additional Equipment:   Intra-op Plan:   Post-operative Plan: Extubation in OR  Informed Consent: I have reviewed the patients History and Physical, chart, labs and discussed the procedure including the risks, benefits and alternatives for the proposed anesthesia with the patient or authorized representative who has indicated his/her  understanding and acceptance.     Dental advisory given  Plan Discussed with: CRNA and Surgeon  Anesthesia Plan Comments: (Plan routine monitors, GETA)        Anesthesia Quick Evaluation

## 2021-08-05 NOTE — Discharge Instructions (Signed)

## 2021-08-05 NOTE — H&P (Signed)
Electrophysiology Office Note   Date:  08/05/2021   ID:  Mary Poole, DOB 1950/02/10, MRN [id number]  PCP:  Everrett Coombe, DO  Cardiologist:  Katrinka Blazing Primary Electrophysiologist:  Raford Brissett Jorja Loa, MD    No chief complaint on file.    History of Present Illness: Mary Poole is a 71 y.o. female who is being seen today for the evaluation of atrial flutter at the request of No ref. provider found. Presenting today for electrophysiology evaluation.   She has a history significant for hypertension, palpitations, atrial fibrillation.  She was diagnosed with atrial fibrillation in 2018.  She went into a wide-complex tachycardia which was thought atrial flutter with one-to-one conduction.  She is now status post atrial fibrillation ablation 11/09/2017.  Unfortunately, she has gone back into atrial fibrillation.  Today, denies symptoms of palpitations, chest pain, shortness of breath, orthopnea, PND, lower extremity edema, claudication, dizziness, presyncope, syncope, bleeding, or neurologic sequela. The patient is tolerating medications without difficulties. Plan ablation today.    Past Medical History:  Diagnosis Date   Anxiety    Atrial fibrillation (HCC)    Complication of anesthesia    "woke up during colonoscopy 02/2017" (11/09/2017)   GERD (gastroesophageal reflux disease)    H/O atrial flutter    History of blood transfusion 02/2017   LGIB (11/09/2017)   Hypertension    Migraine    "a few/year" (11/09/2017)   OSA on CPAP    Past Surgical History:  Procedure Laterality Date   ATRIAL FIBRILLATION ABLATION N/A 11/09/2017   Procedure: ATRIAL FIBRILLATION ABLATION;  Surgeon: Regan Lemming, MD;  Location: [facility] INVASIVE CV LAB;  Service: Cardiovascular;  Laterality: N/A;   COLONOSCOPY N/A 02/24/2017   Procedure: COLONOSCOPY;  Surgeon: Jeani Hawking, MD;  Location: [facility] ENDOSCOPY;  Service: Gastroenterology;  Laterality: N/A;   ENTEROSCOPY N/A 02/24/2017   Procedure:  ENTEROSCOPY;  Surgeon: Jeani Hawking, MD;  Location: [facility] ENDOSCOPY;  Service: Gastroenterology;  Laterality: N/A;   HERNIA REPAIR  1960s   WISDOM TOOTH EXTRACTION       Current Facility-Administered Medications  Medication Dose Route Frequency Provider Last Rate Last Admin   0.9 %  sodium chloride infusion   Intravenous Continuous Regan Lemming, MD 50 mL/hr at 08/05/21 0922 New Bag at 08/05/21 0922    Allergies:   Diphenhydramine hcl and Flecainide   Social History:  The patient  reports that she has quit smoking. Her smoking use included cigarettes. She has a 2.50 pack-year smoking history. She has never used smokeless tobacco. She reports that she does not drink alcohol and does not use drugs.   Family History:  The patient's family history includes AAA (abdominal aortic aneurysm) in her sister; Allergies in an other family member; CAD in her father; Clotting disorder in an other family member; Heart disease in an other family member; Hypertension in an other family member; Migraines in an other family member; Rheum arthritis in an other family member; Thyroid disease in her sister.   ROS:  Please see the history of present illness.   Otherwise, review of systems is positive for none.   All other systems are reviewed and negative.   PHYSICAL EXAM: VS:  BP (!) 159/75   Pulse (!) 128   Temp 97.9 F (36.6 C) (Temporal)   Resp 18   Ht 5\' 5"  (1.651 m)   Wt 98 kg   SpO2 98%   BMI 35.94 kg/m  , BMI Body mass index is 35.94 kg/m.  GEN: Well nourished, well developed, in no acute distress  HEENT: normal  Neck: no JVD, carotid bruits, or masses Cardiac: irregular; no murmurs, rubs, or gallops,no edema  Respiratory:  clear to auscultation bilaterally, normal work of breathing GI: soft, nontender, nondistended, + BS MS: no deformity or atrophy  Skin: warm and dry Neuro:  Strength and sensation are intact Psych: euthymic mood, full affect     Recent Labs: 03/11/2021: TSH  0.636 03/12/2021: ALT 40 04/17/2021: Magnesium 1.8 07/16/2021: BUN 19; Creatinine, Ser 1.11; Hemoglobin 14.1; Platelets 253; Potassium 4.5; Sodium 138    Lipid Panel  No results found for: "CHOL", "TRIG", "HDL", "CHOLHDL", "VLDL", "LDLCALC", "LDLDIRECT"   Wt Readings from Last 3 Encounters:  08/05/21 98 kg  04/29/21 97.6 kg  04/16/21 97.1 kg      Other studies Reviewed: Additional studies/ records that were reviewed today include: Myoview 10/06/16  Review of the above records today demonstrates:  Nuclear stress EF: 58%. The left ventricular ejection fraction is normal (55-65%). The study is normal. There is breast attenuation. No ischemia . no evidence of infarction This is a low risk study.  Holter 10/09/16 - personally reviewed Basic rhythm is NSR rare PAC's and PVC's. Brief SVT < 10 beats. Heart rate range 43-113 bpm with average 63 bpm.   NSR Normal study  TTE 07/19/16 LEFT VENTRICLE The left ventricle is borderline dilated. There is normal left ventricular  wall thickness. Left ventricular systolic function is normal. LV ejection  fraction = 50-55%. Left ventricular filling pattern is indeterminate. No  segmental wall motion abnormalities seen in the left ventricle. LEFT ATRIUM The left atrium is mildly dilated. AORTIC VALVE The aortic valve is not well visualized. There is no aortic regurgitation.    ASSESSMENT AND PLAN:  1.  Paroxysmal atrial fibrillation/flutter: Mary Poole has presented today for surgery, with the diagnosis of AF.  The various methods of treatment have been discussed with the patient and family. After consideration of risks, benefits and other options for treatment, the patient has consented to  Procedure(s): Catheter ablation as a surgical intervention .  Risks include but not limited to complete heart block, stroke, esophageal damage, nerve damage, bleeding, vascular damage, tamponade, perforation, MI, and death. The patient's history has been  reviewed, patient examined, no change in status, stable for surgery.  I have reviewed the patient's chart and labs.  Questions were answered to the patient's satisfaction.    Allissa Albright [name], MD 08/05/2021 10:13 AM

## 2021-08-06 ENCOUNTER — Encounter (HOSPITAL_COMMUNITY): Payer: Self-pay | Admitting: Cardiology

## 2021-08-06 NOTE — Anesthesia Postprocedure Evaluation (Signed)
Anesthesia Post Note  Patient: Mary Poole  Procedure(s) Performed: ATRIAL FIBRILLATION ABLATION     Patient location during evaluation: PACU Anesthesia Type: General Level of consciousness: awake and alert Pain management: pain level controlled Vital Signs Assessment: post-procedure vital signs reviewed and stable Respiratory status: spontaneous breathing, nonlabored ventilation and respiratory function stable Cardiovascular status: blood pressure returned to baseline and stable Postop Assessment: no apparent nausea or vomiting Anesthetic complications: no   There were no known notable events for this encounter.  Last Vitals:  Vitals:   08/05/21 1430 08/05/21 1500  BP: 124/73 111/69  Pulse: 77 76  Resp: 19 15  Temp:    SpO2: 100% 100%    Last Pain:  Vitals:   08/05/21 1430  TempSrc:   PainSc: 2    Pain Goal:                   Lowella Curb

## 2021-08-09 ENCOUNTER — Other Ambulatory Visit (HOSPITAL_COMMUNITY): Payer: Self-pay | Admitting: Physician Assistant

## 2021-09-07 ENCOUNTER — Encounter (HOSPITAL_COMMUNITY): Payer: Self-pay | Admitting: Physician Assistant

## 2021-09-07 ENCOUNTER — Ambulatory Visit (HOSPITAL_COMMUNITY)
Admission: RE | Admit: 2021-09-07 | Discharge: 2021-09-07 | Disposition: A | Discharge status: Home / Self Care | Payer: Medicare Other | Source: Ambulatory Visit | Attending: Physician Assistant | Admitting: Physician Assistant

## 2021-09-07 VITALS — BP 150/100 | HR 60 | Ht 65.0 in | Wt 216.0 lb

## 2021-09-07 DIAGNOSIS — D6869 Other thrombophilia: Secondary | ICD-10-CM | POA: Insufficient documentation

## 2021-09-07 DIAGNOSIS — Z79899 Other long term (current) drug therapy: Secondary | ICD-10-CM | POA: Insufficient documentation

## 2021-09-07 DIAGNOSIS — E669 Obesity, unspecified: Secondary | ICD-10-CM | POA: Insufficient documentation

## 2021-09-07 DIAGNOSIS — I2510 Atherosclerotic heart disease of native coronary artery without angina pectoris: Secondary | ICD-10-CM | POA: Diagnosis not present

## 2021-09-07 DIAGNOSIS — I10 Essential (primary) hypertension: Secondary | ICD-10-CM | POA: Insufficient documentation

## 2021-09-07 DIAGNOSIS — Z6835 Body mass index (BMI) 35.0-35.9, adult: Secondary | ICD-10-CM | POA: Diagnosis not present

## 2021-09-07 DIAGNOSIS — I4892 Unspecified atrial flutter: Secondary | ICD-10-CM | POA: Diagnosis not present

## 2021-09-07 DIAGNOSIS — Z7901 Long term (current) use of anticoagulants: Secondary | ICD-10-CM | POA: Diagnosis not present

## 2021-09-07 DIAGNOSIS — I4819 Other persistent atrial fibrillation: Secondary | ICD-10-CM | POA: Diagnosis not present

## 2021-09-07 MED ORDER — METOPROLOL SUCCINATE ER 50 MG PO TB24: 75.0000 mg | ORAL_TABLET | Freq: Every day | ORAL | 3 refills | Status: DC

## 2021-09-07 NOTE — Progress Notes (Signed)
Primary Care Physician: Everrett Coombe, DO Primary Cardiologist: Dr Katrinka Blazing Primary Electrophysiologist: Dr [name] Referring Physician: Dr [name]  Sleep medicine: Dr Franchot Gallo is a 71 y.o. female with a history of OSA, HTN, atrial flutter, atrial fibrillation who presents for follow up in the [facility]. She was diagnosed with atrial fibrillation on 07/18/16. She had 2 episodes of chest pressure with radiation to her arm in late May or early June. It lasted 20 minutes. EMS was called but she felt better. 10 days later, she had similar symptoms.  Went to the emergency room where she had a wide-complex tachycardia.  While in the emergency room, her rate slowed and showed a likely atrial flutter.  It was thought that this was the same rhythm with the wide-complex tachycardia being apparent 1-1 conduction.  She reverted to sinus rhythm with IV medications. She is now s/p afib/flutter ablation 11/09/17. Patient is on Eliquis for a CHADS2VASC score of 3.   She had persistent heart racing since 03/07/21. She did drink a drink high in sodium before the onset of her symptoms which she believes could have been a trigger for her. Her metoprolol was increased and she was scheduled for outpatient DCCV but she presented to the ED the next day for urgent DCCV on 03/12/21. She unfortunately had early return of her atrial flutter and was started on amiodarone. She went to the ED 04/17/21 and was cardioverted again. She underwent repeat afib and flutter ablation 08/05/21 with Dr [name].   On follow up today, patient reports that she has done well since the ablation. She did have one brief 5 minute episodes of tachypalpitations. She self-stopped amiodarone and decreased her BB to 75 mg daily. She denies CP, swallowing pain, or groin issues.   Today, she denies symptoms of palpitations, chest pain, PND, lower extremity edema, dizziness, presyncope, syncope, bleeding, or neurologic  sequela. The patient is tolerating medications without difficulties and is otherwise without complaint today.    Atrial Fibrillation Risk Factors:  she does have symptoms or diagnosis of sleep apnea. she is compliant with CPAP therapy. she does not have a history of rheumatic fever.   she has a BMI of Body mass index is 35.94 kg/m.Marland Kitchen Filed Weights   09/07/21 1136  Weight: 98 kg    Family History  Problem Relation Age of Onset   CAD Father    AAA (abdominal aortic aneurysm) Sister    Thyroid disease Sister    Allergies Other    Clotting disorder Other    Hypertension Other    Heart disease Other    Rheum arthritis Other    Migraines Other      Atrial Fibrillation Management history:  Previous antiarrhythmic drugs: flecainide, amiodarone  Previous cardioversions: 03/12/21, 08/05/21 Previous ablations: 11/09/17 afib and flutter, 08/05/21 CHADS2VASC score: 3 Anticoagulation history: Eliquis   Past Medical History:  Diagnosis Date   Anxiety    Atrial fibrillation (HCC)    Complication of anesthesia    "woke up during colonoscopy 02/2017" (11/09/2017)   GERD (gastroesophageal reflux disease)    H/O atrial flutter    History of blood transfusion 02/2017   LGIB (11/09/2017)   Hypertension    Migraine    "a few/year" (11/09/2017)   OSA on CPAP    Past Surgical History:  Procedure Laterality Date   ATRIAL FIBRILLATION ABLATION N/A 11/09/2017   Procedure: ATRIAL FIBRILLATION ABLATION;  Surgeon: Regan Lemming, MD;  [facility];  Service: Cardiovascular;  Laterality: N/A;   ATRIAL FIBRILLATION ABLATION N/A 08/05/2021   Procedure: ATRIAL FIBRILLATION ABLATION;  Surgeon: Regan Lemming, MD;  Location: [facility] CV LAB;  Service: Cardiovascular;  Laterality: N/A;   COLONOSCOPY N/A 02/24/2017   Procedure: COLONOSCOPY;  Surgeon: Jeani Hawking, MD;  Location: [facility] ENDOSCOPY;  Service: Gastroenterology;  Laterality: N/A;   ENTEROSCOPY N/A 02/24/2017    Procedure: ENTEROSCOPY;  Surgeon: Jeani Hawking, MD;  Location: [facility] ENDOSCOPY;  Service: Gastroenterology;  Laterality: N/A;   HERNIA REPAIR  1960s   WISDOM TOOTH EXTRACTION      Current Outpatient Medications  Medication Sig Dispense Refill   acetaminophen (TYLENOL) 650 MG CR tablet Take 650-1,300 mg by mouth every 8 (eight) hours as needed for pain.     apixaban (ELIQUIS) 5 MG TABS tablet TAKE 1 TABLET BY MOUTH TWICE A DAY 60 tablet 11   diltiazem (CARDIZEM CD) 240 MG 24 hr capsule Take 1 capsule (240 mg total) by mouth every morning. 30 capsule 3   furosemide (LASIX) 20 MG tablet TAKE 1 TABLET BY MOUTH EVERY DAY AS NEEDED FOR SWELLING 90 tablet 1   metoprolol succinate (TOPROL-XL) 50 MG 24 hr tablet TAKE 1 TABLET BY MOUTH TWICE A DAY 60 tablet 3   Multiple Vitamin (MULTIVITAMIN) LIQD Take 30 mLs by mouth 3 (three) times a week. Nutriburst     omeprazole (PRILOSEC) 40 MG capsule TAKE 1 CAPSULE (40 MG TOTAL) BY MOUTH DAILY. 90 capsule 3   PRESCRIPTION MEDICATION Inhale into the lungs at bedtime. CPAP     amiodarone (PACERONE) 200 MG tablet Take 1 tablet (200 mg total) by mouth daily. Take 1 tablet by mouth twice a day then reduce to 1 tablet dail (Patient not taking: Reported on 09/07/2021) 30 tablet 3   No current facility-administered medications for this encounter.    Allergies  Allergen Reactions   Diphenhydramine Hcl Hives and Other (See Comments)    Erratic heartbeat   Flecainide Rash    Social History   Socioeconomic History   Marital status: Married    Spouse name: Cyd Silence.   Number of children: 0   Years of education: 16   Highest education level: Bachelor's degree (e.g., BA, AB, BS)  Occupational History   Occupation: Retired  Tobacco Use   Smoking status: Former    Packs/day: 0.25    Years: 10.00    Total pack years: 2.50    Types: Cigarettes   Smokeless tobacco: Never   Tobacco comments:    11/09/2017 "quit smoking in the 1980s"  Vaping Use   Vaping Use:  Never used  Substance and Sexual Activity   Alcohol use: Never   Drug use: Never   Sexual activity: Not Currently  Other Topics Concern   Not on file  Social History Narrative   Lives with her husband. She enjoys reading in her free time.   Social Determinants of Health   Financial Resource Strain: Low Risk  (04/10/2021)   Overall Financial Resource Strain (CARDIA)    Difficulty of Paying Living Expenses: Not hard at all  Food Insecurity: No Food Insecurity (04/10/2021)   Hunger Vital Sign    Worried About Running Out of Food in the Last Year: Never true    Ran Out of Food in the Last Year: Never true  Transportation Needs: No Transportation Needs (04/10/2021)   PRAPARE - Transportation    Lack of Transportation (Medical): No    Lack of  Transportation (Non-Medical): No  Physical Activity: Inactive (04/10/2021)   Exercise Vital Sign    Days of Exercise per Week: 0 days    Minutes of Exercise per Session: 0 min  Stress: No Stress Concern Present (04/10/2021)   [employer] of Occupational Health - Occupational Stress Questionnaire    Feeling of Stress : Not at all  Social Connections: Socially Integrated (04/10/2021)   Social Connection and Isolation Panel [NHANES]    Frequency of Communication with Friends and Family: More than three times a week    Frequency of Social Gatherings with Friends and Family: Once a week    Attends Religious Services: More than 4 times per year    Active Member of [employer] or Organizations: Yes    Attends Engineer, structural: More than 4 times per year    Marital Status: Married  Catering manager Violence: Not At Risk (04/10/2021)   Humiliation, Afraid, Rape, and Kick questionnaire    Fear of Current or Ex-Partner: No    Emotionally Abused: No    Physically Abused: No    Sexually Abused: No     ROS- All systems are reviewed and negative except as per the HPI above.  Physical Exam: Vitals:   09/07/21 1136  BP: (!) 150/100  Pulse: 60   Weight: 98 kg  Height: 5\' 5"  (1.651 m)     GEN- The patient is a well appearing obese female, alert and oriented x 3 today.   HEENT-head normocephalic, atraumatic, sclera clear, conjunctiva pink, hearing intact, trachea midline. Lungs- Clear to ausculation bilaterally, normal work of breathing Heart- Regular rate and rhythm, no murmurs, rubs or gallops  GI- soft, NT, ND, + BS Extremities- no clubbing, cyanosis, or edema MS- no significant deformity or atrophy Skin- no rash or lesion Psych- euthymic mood, full affect Neuro- strength and sensation are intact   Wt Readings from Last 3 Encounters:  09/07/21 98 kg  08/05/21 98 kg  04/29/21 97.6 kg    EKG today demonstrates  SR, short PR Vent. rate 60 BPM PR interval 104 ms QRS duration 100 ms QT/QTcB 428/428 ms  Echo 09/18/17 demonstrated  - Left ventricle: The cavity size was normal. Wall thickness was    increased in a pattern of mild LVH. Systolic function was normal.    The estimated ejection fraction was in the range of 55% to 60%.    Wall motion was normal; there were no regional wall motion    abnormalities. Doppler parameters are consistent with restrictive    physiology, indicative of decreased left ventricular diastolic    compliance and/or increased left atrial pressure.  - Mitral valve: Calcified annulus. There was moderate    regurgitation.  - Left atrium: The atrium was severely dilated.  - Atrial septum: The septum bowed from left to right, consistent    with increased left atrial pressure.  - Pulmonary arteries: Systolic pressure was moderately increased.    PA peak pressure: 52 mm Hg (S).   Impressions:   - Normal LV function; mild LVH; restrictive filling; moderate MR;    severe LAE; mild TR with moderate pulmonary hypertension.  Epic records are reviewed at length today  CHA2DS2-VASc Score = 4  The patient's score is based upon: CHF History: 0 HTN History: 1 Diabetes History: 0 Stroke History:  0 Vascular Disease History: 1 Age Score: 1 Gender Score: 1        ASSESSMENT AND PLAN: 1. Persistent Atrial Fibrillation/atrial flutter The patient's CHA2DS2-VASc score  is 4, indicating a 4.8% annual risk of stroke.   S/p repeat afib and flutter ablation 08/05/21 Patient in SR today. Continue diltiazem 240 mg daily Continue Toprol 75 mg daily Continue Eliquis 5 mg BID with no missed doses for 3 months post ablation.   2. Secondary Hypercoagulable State (ICD10:  D68.69) The patient is at significant risk for stroke/thromboembolism based upon her CHA2DS2-VASc Score of 4.  Continue Apixaban (Eliquis).   3. Obesity Body mass index is 35.94 kg/m. Lifestyle modification was discussed and encouraged including regular physical activity and weight reduction.  4. Obstructive sleep apnea Followed by Dr Mayford Knife. Encouraged compliance with CPAP therapy.  5. HTN Elevated today, has been well controlled on her home machine.   6. CAD CAC score 197, 85th percentile No anginal symptoms.    Follow up with Dr [name] as scheduled.    Jorja Loa PA-C [facility] [address] [place], [place] [address] [phone] 09/07/2021 11:43 AM

## 2021-09-30 ENCOUNTER — Other Ambulatory Visit (HOSPITAL_COMMUNITY): Payer: Self-pay | Admitting: Physician Assistant

## 2021-10-23 LAB — POCT GLYCOSYLATED HEMOGLOBIN (HGB A1C): Hemoglobin-A1c: 6.1

## 2021-10-23 LAB — GLUCOSE, POCT (MANUAL RESULT ENTRY): POC Glucose: 127 mg/dl — AB (ref 70–99)

## 2021-11-03 ENCOUNTER — Ambulatory Visit: Payer: Medicare Other | Admitting: Cardiology

## 2021-11-10 DIAGNOSIS — H2513 Age-related nuclear cataract, bilateral: Secondary | ICD-10-CM | POA: Diagnosis not present

## 2021-11-10 DIAGNOSIS — H04123 Dry eye syndrome of bilateral lacrimal glands: Secondary | ICD-10-CM | POA: Diagnosis not present

## 2021-11-10 DIAGNOSIS — H43812 Vitreous degeneration, left eye: Secondary | ICD-10-CM | POA: Diagnosis not present

## 2021-11-10 DIAGNOSIS — R7309 Other abnormal glucose: Secondary | ICD-10-CM | POA: Diagnosis not present

## 2021-11-20 ENCOUNTER — Telehealth: Payer: Self-pay | Admitting: Cardiology

## 2021-11-20 NOTE — Telephone Encounter (Signed)
  Pt c/o medication issue:  1. Name of Medication: amoxicillin   2. How are you currently taking this medication (dosage and times per day)?    3. Are you having a reaction (difficulty breathing--STAT)? no  4. What is your medication issue? Calling to see if its okay for her to take after the oral surgery she had on yesterday. Please advise

## 2021-11-23 NOTE — Telephone Encounter (Signed)
Yes, ok to take amoxicillin

## 2021-11-23 NOTE — Telephone Encounter (Signed)
Advised the patient that it was okay to take amoxicillin.  Verbalized understanding.

## 2021-12-07 ENCOUNTER — Ambulatory Visit: Payer: Medicare Other | Attending: Cardiology | Admitting: Cardiology

## 2021-12-07 ENCOUNTER — Encounter: Payer: Self-pay | Admitting: Cardiology

## 2021-12-07 VITALS — BP 166/82 | HR 66 | Ht 65.0 in | Wt 215.0 lb

## 2021-12-07 DIAGNOSIS — I4819 Other persistent atrial fibrillation: Secondary | ICD-10-CM

## 2021-12-07 DIAGNOSIS — D6869 Other thrombophilia: Secondary | ICD-10-CM

## 2021-12-07 DIAGNOSIS — I10 Essential (primary) hypertension: Secondary | ICD-10-CM | POA: Diagnosis not present

## 2021-12-07 NOTE — Patient Instructions (Addendum)
Medication Instructions:  Your physician recommends that you continue on your current medications as directed. Please refer to the Current Medication list given to you today.  *If you need a refill on your cardiac medications before your next appointment, please call your pharmacy*   Lab Work: None ordered.  If you have labs (blood work) drawn today and your tests are completely normal, you will receive your results only by: [place] (if you have MyChart) OR A paper copy in the mail If you have any lab test that is abnormal or we need to change your treatment, we will call you to review the results.   Testing/Procedures: None ordered.    Follow-Up: At [facility], you and your health needs are our priority.  As part of our continuing mission to provide you with exceptional heart care, we have created designated Provider Care Teams.  These Care Teams include your primary Cardiologist (physician) and Advanced Practice Providers (APPs -  Physician Assistants and Nurse Practitioners) who all work together to provide you with the care you need, when you need it.  We recommend signing up for the patient portal called "MyChart".  Sign up information is provided on this After Visit Summary.  MyChart is used to connect with patients for Virtual Visits (Telemedicine).  Patients are able to view lab/test results, encounter notes, upcoming appointments, etc.  Non-urgent messages can be sent to your provider as well.   To learn more about what you can do with MyChart, go to [email or web address].    Your next appointment:   6 months with Dr Curt Bears PA  Other Instructions Referred to [facility] Continue to monitor your BP at home  Important Information About Sugar

## 2021-12-07 NOTE — Progress Notes (Signed)
Electrophysiology Office Note   Date:  12/07/2021   ID:  CURTINA Poole, DOB 1950-10-05, MRN [id number]  PCP:  Mary Nutting, DO  Cardiologist:  Mary Poole Primary Electrophysiologist:  Mary Winslett Meredith Leeds, MD    No chief complaint on file.    History of Present Illness: Mary Poole is a 71 y.o. female who is being seen today for the evaluation of atrial flutter at the request of Mary Nutting, DO. Presenting today for electrophysiology evaluation.   She has a history significant for hypertension, palpitations, atrial fibrillation.  She was diagnosed with atrial fibrillation in 2018.  She is post ablation 11/09/2017.  She went back into atrial flutter and is post repeat ablation 08/05/2021.  Today, denies symptoms of palpitations, chest pain, shortness of breath, orthopnea, PND, lower extremity edema, claudication, dizziness, presyncope, syncope, bleeding, or neurologic sequela. The patient is tolerating medications without difficulties.  Since her ablation she has done well.  She has had no further episodes of atrial fibrillation or atrial flutter.  She is quite happy with how she has been feeling.  She has been able to be active over the last few months.   Past Medical History:  Diagnosis Date   Anxiety    Atrial fibrillation ([place])    Complication of anesthesia    "woke up during colonoscopy 02/2017" (11/09/2017)   GERD (gastroesophageal reflux disease)    H/O atrial flutter    History of blood transfusion 02/2017   LGIB (11/09/2017)   Hypertension    Migraine    "a few/year" (11/09/2017)   OSA on CPAP    Past Surgical History:  Procedure Laterality Date   ATRIAL FIBRILLATION ABLATION N/A 11/09/2017   Procedure: ATRIAL FIBRILLATION ABLATION;  Surgeon: Mary Haw, MD;  Location: [place] CV LAB;  Service: Cardiovascular;  Laterality: N/A;   ATRIAL FIBRILLATION ABLATION N/A 08/05/2021   Procedure: ATRIAL FIBRILLATION ABLATION;  Surgeon: Mary Haw, MD;   Location: [place] CV LAB;  Service: Cardiovascular;  Laterality: N/A;   COLONOSCOPY N/A 02/24/2017   Procedure: COLONOSCOPY;  Surgeon: Mary Ada, MD;  Location: [place];  Service: Gastroenterology;  Laterality: N/A;   ENTEROSCOPY N/A 02/24/2017   Procedure: ENTEROSCOPY;  Surgeon: Mary Ada, MD;  Location: [place];  Service: Gastroenterology;  Laterality: N/A;   HERNIA REPAIR  1960s   WISDOM TOOTH EXTRACTION       Current Outpatient Medications  Medication Sig Dispense Refill   acetaminophen (TYLENOL) 650 MG CR tablet Take 650-1,300 mg by mouth every 8 (eight) hours as needed for pain.     apixaban (ELIQUIS) 5 MG TABS tablet TAKE 1 TABLET BY MOUTH TWICE A DAY 60 tablet 11   diltiazem (CARDIZEM CD) 240 MG 24 hr capsule TAKE 1 CAPSULE (240 MG TOTAL) BY MOUTH EVERY MORNING. 30 capsule 3   furosemide (LASIX) 20 MG tablet TAKE 1 TABLET BY MOUTH EVERY DAY AS NEEDED FOR SWELLING 90 tablet 1   metoprolol succinate (TOPROL-XL) 50 MG 24 hr tablet Take 1.5 tablets (75 mg total) by mouth daily. Take with or immediately following a meal. 60 tablet 3   Multiple Vitamin (MULTIVITAMIN) LIQD Take 30 mLs by mouth 3 (three) times a week. Nutriburst     omeprazole (PRILOSEC) 40 MG capsule TAKE 1 CAPSULE (40 MG TOTAL) BY MOUTH DAILY. 90 capsule 3   PRESCRIPTION MEDICATION Inhale into the lungs at bedtime. CPAP     No current facility-administered medications for this visit.    Allergies:  Diphenhydramine hcl and Flecainide   Social History:  The patient  reports that she has quit smoking. Her smoking use included cigarettes. She has a 2.50 pack-year smoking history. She has never used smokeless tobacco. She reports that she does not drink alcohol and does not use drugs.   Family History:  The patient's family history includes AAA (abdominal aortic aneurysm) in her sister; Allergies in an other family member; CAD in her father; Clotting disorder in an other family member; Heart disease in  an other family member; Hypertension in an other family member; Migraines in an other family member; Rheum arthritis in an other family member; Thyroid disease in her sister.   ROS:  Please see the history of present illness.   Otherwise, review of systems is positive for none.   All other systems are reviewed and negative.   PHYSICAL EXAM: VS:  BP (!) 166/82   Pulse 66   Ht 5\' 5"  (1.651 m)   Wt 215 lb (97.5 kg)   SpO2 99%   BMI 35.78 kg/m  , BMI Body mass index is 35.78 kg/m. GEN: Well nourished, well developed, in no acute distress  HEENT: normal  Neck: no JVD, carotid bruits, or masses Cardiac: RRR; no murmurs, rubs, or gallops,no edema  Respiratory:  clear to auscultation bilaterally, normal work of breathing GI: soft, nontender, nondistended, + BS MS: no deformity or atrophy  Skin: warm and dry Neuro:  Strength and sensation are intact Psych: euthymic mood, full affect  EKG:  EKG is ordered today. Personal review of the ekg ordered shows sinus rhythm, rate 66  Recent Labs: 03/11/2021: TSH 0.636 03/12/2021: ALT 40 04/17/2021: Magnesium 1.8 07/16/2021: BUN 19; Creatinine, Ser 1.11; Hemoglobin 14.1; Platelets 253; Potassium 4.5; Sodium 138    Lipid Panel  No results found for: "CHOL", "TRIG", "HDL", "CHOLHDL", "VLDL", "LDLCALC", "LDLDIRECT"   Wt Readings from Last 3 Encounters:  12/07/21 215 lb (97.5 kg)  09/07/21 216 lb (98 kg)  08/05/21 216 lb (98 kg)      Other studies Reviewed: Additional studies/ records that were reviewed today include: Myoview 10/06/16  Review of the above records today demonstrates:  Nuclear stress EF: 58%. The left ventricular ejection fraction is normal (55-65%). The study is normal. There is breast attenuation. No ischemia . no evidence of infarction This is a low risk study.  Holter 10/09/16 - personally reviewed Basic rhythm is NSR rare PAC's and PVC's. Brief SVT < 10 beats. Heart rate range 43-113 bpm with average 63 bpm.   NSR Normal  study  TTE 07/19/16 LEFT VENTRICLE The left ventricle is borderline dilated. There is normal left ventricular  wall thickness. Left ventricular systolic function is normal. LV ejection  fraction = 50-55%. Left ventricular filling pattern is indeterminate. No  segmental wall motion abnormalities seen in the left ventricle. LEFT ATRIUM The left atrium is mildly dilated. AORTIC VALVE The aortic valve is not well visualized. There is no aortic regurgitation.    ASSESSMENT AND PLAN:  1.  Paroxysmal atrial fibrillation/flutter: Currently on Eliquis, diltiazem, metoprolol.  CHA2DS2-VASc of 3.  Status post ablation 10-19 with repeat ablation 08/05/2021.  Since her ablation she has done well.  She has had no further episodes of atrial fibrillation or flutter.  She is overall quite happy with her control.  Continue with current management.  2.  Hypertension: Blood pressure elevated today as well as being elevated on recheck.  She Emmet Messer make sure that when she gets home it is more  normal and follow-up with her primary physician.  3.  Secondary hypercoagulable state: Currently on Eliquis for atrial fibrillation as above  4.  Obstructive sleep apnea: CPAP compliance encouraged  5.  Obesity: Diet and exercise encouraged.  She is quite frustrated by this.  Analysia Dungee refer to [facility] weight loss clinic. Body mass index is 35.78 kg/m.   Current medicines are reviewed at length with the patient today.   The patient does not have concerns regarding her medicines.  The following changes were made today: None  Labs/ tests ordered today include:  Orders Placed This Encounter  Procedures   Amb Ref to [facility]   EKG 12-Lead     Disposition:   FU 6 months  Signed, Jerrine Urschel Meredith Leeds, MD  12/07/2021 2:36 PM     [place] [address] [place] [address] [phone] (office) [phone] (fax)

## 2022-01-08 ENCOUNTER — Other Ambulatory Visit: Payer: Self-pay

## 2022-01-08 MED ORDER — METOPROLOL SUCCINATE ER 50 MG PO TB24: 75.0000 mg | ORAL_TABLET | Freq: Every day | ORAL | 3 refills | Status: DC

## 2022-01-08 NOTE — Telephone Encounter (Signed)
Pt's medication was sent to pt's pharmacy as requested. Confirmation received.  °

## 2022-01-13 ENCOUNTER — Telehealth: Payer: Self-pay | Admitting: Cardiology

## 2022-01-13 NOTE — Telephone Encounter (Signed)
*  STAT* If patient is at the pharmacy, call can be transferred to refill team.   1. Which medications need to be refilled? (please list name of each medication and dose if known)  metoprolol succinate (TOPROL-XL) 50 MG 24 hr tablet  2. Which pharmacy/location (including street and city if local pharmacy) is medication to be sent to? [facility] - [address] [place], [place], [place] [address]  3. Do they need a 30 day or 90 day supply?  90 day supply  [facility] received RX. Patient is requesting to have it transferred to [facility].

## 2022-01-14 MED ORDER — METOPROLOL SUCCINATE ER 50 MG PO TB24: 75.0000 mg | ORAL_TABLET | Freq: Every day | ORAL | 2 refills | Status: DC

## 2022-01-14 NOTE — Telephone Encounter (Signed)
Rx refill sent to pharmacy. 

## 2022-01-22 ENCOUNTER — Other Ambulatory Visit: Payer: Self-pay | Admitting: Cardiology

## 2022-02-08 ENCOUNTER — Other Ambulatory Visit (HOSPITAL_COMMUNITY): Payer: Self-pay | Admitting: Physician Assistant

## 2022-03-04 ENCOUNTER — Other Ambulatory Visit: Payer: Self-pay | Admitting: *Deleted

## 2022-03-04 MED ORDER — METOPROLOL SUCCINATE ER 50 MG PO TB24: 75.0000 mg | ORAL_TABLET | Freq: Every day | ORAL | 2 refills | Status: DC

## 2022-03-22 ENCOUNTER — Other Ambulatory Visit: Payer: Self-pay | Admitting: Family Medicine

## 2022-04-01 ENCOUNTER — Encounter (INDEPENDENT_AMBULATORY_CARE_PROVIDER_SITE_OTHER): Payer: Medicare Other | Admitting: Internal Medicine

## 2022-04-16 ENCOUNTER — Other Ambulatory Visit: Payer: Self-pay | Admitting: Pharmacist

## 2022-04-16 NOTE — Progress Notes (Signed)
Patient appearing on report for [employer] - Hypertension Control report due to last documented ambulatory blood pressure of 166/82 on 12/07/21. Next appointment with PCP is not yet scheduled.   Outreached patient to discuss hypertension control and medication management. Left voicemail for patient to return my call at their convenience.   Larinda Buttery, PharmD Clinical Pharmacist [facility] [phone]

## 2022-04-27 DIAGNOSIS — R0982 Postnasal drip: Secondary | ICD-10-CM | POA: Diagnosis not present

## 2022-04-27 DIAGNOSIS — J3089 Other allergic rhinitis: Secondary | ICD-10-CM | POA: Diagnosis not present

## 2022-05-03 ENCOUNTER — Ambulatory Visit (INDEPENDENT_AMBULATORY_CARE_PROVIDER_SITE_OTHER): Payer: Medicare Other | Admitting: Family Medicine

## 2022-05-03 ENCOUNTER — Telehealth: Payer: Self-pay | Admitting: *Deleted

## 2022-05-03 DIAGNOSIS — Z780 Asymptomatic menopausal state: Secondary | ICD-10-CM | POA: Diagnosis not present

## 2022-05-03 DIAGNOSIS — Z1231 Encounter for screening mammogram for malignant neoplasm of breast: Secondary | ICD-10-CM

## 2022-05-03 DIAGNOSIS — Z0000 Encounter for general adult medical examination without abnormal findings: Secondary | ICD-10-CM | POA: Diagnosis not present

## 2022-05-03 NOTE — Addendum Note (Signed)
Addended by: Tinnie Gens on: 05/03/2022 02:51 PM   Modules accepted: Orders

## 2022-05-03 NOTE — Patient Instructions (Addendum)
[place] Maintenance Summary and Written Plan of Care  [name] ,  Thank you for allowing me to perform your Medicare Annual Wellness Visit and for your ongoing commitment to your health.   Health Maintenance & Immunization History Health Maintenance  Topic Date Due   DTaP/Tdap/Td (1 - Tdap) Never done   COVID-19 Vaccine (6 - 2023-24 season) 05/19/2022 (Originally 10/09/2021)   Pneumonia Vaccine 28+ Years old (1 of 1 - PCV) 05/03/2023 (Originally 10/11/2015)   MAMMOGRAM  05/03/2023 (Originally 10/10/2000)   DEXA SCAN  05/03/2023 (Originally 10/11/2015)   Hepatitis C Screening  05/03/2023 (Originally 10/10/1968)   Medicare Annual Wellness (AWV)  05/03/2023   COLONOSCOPY (Pts 45-47yrs Insurance coverage will need to be confirmed)  02/25/2027   Zoster Vaccines- Shingrix  Completed   HPV VACCINES  Aged Out   INFLUENZA VACCINE  Discontinued   Immunization History  Administered Date(s) Administered   PFIZER Comirnaty(Gray Top)Covid-19 Tri-Sucrose Vaccine 08/05/2020   PFIZER(Purple Top)SARS-COV-2 Vaccination 03/24/2019, 04/18/2019, 12/08/2019   Pfizer Covid-19 Vaccine Bivalent Booster 20yrs & up 12/19/2020   Zoster Recombinat (Shingrix) 07/12/2020, 03/24/2021    These are the patient goals that we discussed:  Goals Addressed               This Visit's Progress     Patient Stated (pt-stated)        Patient stated that she would like to work on weight loss.         This is a list of Health Maintenance Items that are overdue or due now: Health Maintenance Due  Topic Date Due   DTaP/Tdap/Td (1 - Tdap) Never done   Pneumococcal vaccine  Td vaccine Screening mammography Bone densitometry screening  Orders/Referrals Placed Today: Orders Placed This Encounter  Procedures   DEXAScan    Standing Status:   Future    Standing Expiration Date:   05/03/2023    Scheduling Instructions:     Please call patient to schedule    Order Specific Question:    Reason for exam:    Answer:   post menopausal    Order Specific Question:   Preferred imaging location?    Answer:   [facility]   Mammogram 3D SCREEN BREAST BILATERAL    Standing Status:   Future    Standing Expiration Date:   05/03/2023    Scheduling Instructions:     Please call patient to schedule    Order Specific Question:   Reason for Exam (SYMPTOM  OR DIAGNOSIS REQUIRED)    Answer:   breast cancer screening    Order Specific Question:   Preferred imaging location?    Answer:   [facility]   (Contact our referral department at [phone] if you have not spoken with someone about your referral appointment within the next 5 days)    Follow-up Plan Follow-up with Luetta Nutting, DO as planned Schedule tetanus vaccine at the pharmacy.  Bring your record for pneumonia vaccine.  Medicare wellness visit in one year.      Health Maintenance, Female Adopting a healthy lifestyle and getting preventive care are important in promoting health and wellness. Ask your health care provider about: The right schedule for you to have regular tests and exams. Things you can do on your own to prevent diseases and keep yourself healthy. What should I know about diet, weight, and exercise? Eat a healthy diet  Eat a diet that includes plenty of vegetables, fruits, low-fat dairy products, and lean  protein. Do not eat a lot of foods that are high in solid fats, added sugars, or sodium. Maintain a healthy weight Body mass index (BMI) is used to identify weight problems. It estimates body fat based on height and weight. Your health care provider can help determine your BMI and help you achieve or maintain a healthy weight. Get regular exercise Get regular exercise. This is one of the most important things you can do for your health. Most adults should: Exercise for at least 150 minutes each week. The exercise should increase your heart rate and make you sweat  (moderate-intensity exercise). Do strengthening exercises at least twice a week. This is in addition to the moderate-intensity exercise. Spend less time sitting. Even light physical activity can be beneficial. Watch cholesterol and blood lipids Have your blood tested for lipids and cholesterol at 72 years of age, then have this test every 5 years. Have your cholesterol levels checked more often if: Your lipid or cholesterol levels are high. You are older than 72 years of age. You are at high risk for heart disease. What should I know about cancer screening? Depending on your health history and family history, you may need to have cancer screening at various ages. This may include screening for: Breast cancer. Cervical cancer. Colorectal cancer. Skin cancer. Lung cancer. What should I know about heart disease, diabetes, and high blood pressure? Blood pressure and heart disease High blood pressure causes heart disease and increases the risk of stroke. This is more likely to develop in people who have high blood pressure readings or are overweight. Have your blood pressure checked: Every 3-5 years if you are 3-34 years of age. Every year if you are 21 years old or older. Diabetes Have regular diabetes screenings. This checks your fasting blood sugar level. Have the screening done: Once every three years after age 7 if you are at a normal weight and have a low risk for diabetes. More often and at a younger age if you are overweight or have a high risk for diabetes. What should I know about preventing infection? Hepatitis B If you have a higher risk for hepatitis B, you should be screened for this virus. Talk with your health care provider to find out if you are at risk for hepatitis B infection. Hepatitis C Testing is recommended for: Everyone born from [birth year] through 1965. Anyone with known risk factors for hepatitis C. Sexually transmitted infections (STIs) Get screened for STIs,  including gonorrhea and chlamydia, if: You are sexually active and are younger than 72 years of age. You are older than 72 years of age and your health care provider tells you that you are at risk for this type of infection. Your sexual activity has changed since you were last screened, and you are at increased risk for chlamydia or gonorrhea. Ask your health care provider if you are at risk. Ask your health care provider about whether you are at high risk for HIV. Your health care provider may recommend a prescription medicine to help prevent HIV infection. If you choose to take medicine to prevent HIV, you should first get tested for HIV. You should then be tested every 3 months for as long as you are taking the medicine. Pregnancy If you are about to stop having your period (premenopausal) and you may become pregnant, seek counseling before you get pregnant. Take 400 to 800 micrograms (mcg) of folic acid every day if you become pregnant. Ask for birth control (contraception)  if you want to prevent pregnancy. Osteoporosis and menopause Osteoporosis is a disease in which the bones lose minerals and strength with aging. This can result in bone fractures. If you are 55 years old or older, or if you are at risk for osteoporosis and fractures, ask your health care provider if you should: Be screened for bone loss. Take a calcium or vitamin D supplement to lower your risk of fractures. Be given hormone replacement therapy (HRT) to treat symptoms of menopause. Follow these instructions at home: Alcohol use Do not drink alcohol if: Your health care provider tells you not to drink. You are pregnant, may be pregnant, or are planning to become pregnant. If you drink alcohol: Limit how much you have to: 0-1 drink a day. Know how much alcohol is in your drink. In the [place] one drink equals one 12 oz bottle of beer (355 mL), one 5 oz glass of wine (148 mL), or one 1 oz glass of hard liquor (44  mL). Lifestyle Do not use any products that contain nicotine or tobacco. These products include cigarettes, chewing tobacco, and vaping devices, such as e-cigarettes. If you need help quitting, ask your health care provider. Do not use street drugs. Do not share needles. Ask your health care provider for help if you need support or information about quitting drugs. General instructions Schedule regular health, dental, and eye exams. Stay current with your vaccines. Tell your health care provider if: You often feel depressed. You have ever been abused or do not feel safe at home. Summary Adopting a healthy lifestyle and getting preventive care are important in promoting health and wellness. Follow your health care provider's instructions about healthy diet, exercising, and getting tested or screened for diseases. Follow your health care provider's instructions on monitoring your cholesterol and blood pressure. This information is not intended to replace advice given to you by your health care provider. Make sure you discuss any questions you have with your health care provider. Document Revised: 06/16/2020 Document Reviewed: 06/16/2020 Elsevier Patient Education  [place].

## 2022-05-03 NOTE — Telephone Encounter (Signed)
  LEFT A MESSAGE FOR AFFORDABLE DENTURES TO CALL BACK WITH THE AMOUNT OF EX     Pre-operative Risk Assessment    Patient Name: Mary Poole  DOB: [birth year] MRN: [id number]     Request for Surgical Clearance    Procedure:  Dental Extraction - Amount of Teeth to be Pulled:     Date of Surgery:  Clearance TBD                                 Surgeon:  AFFORDABLE DENTURES & IMPLANTS Surgeon's Group or Practice Name:  DR. Hilario Quarry., DDS, PA Phone number:  [phone] Fax number:  [phone]   Type of Clearance Requested:   - Medical  - Pharmacy:  Hold Apixaban (Eliquis) NOT INDICATED HOW LONG   Type of Anesthesia:  Not Indicated   Additional requests/questions:    Mary Poole   05/03/2022, 3:16 PM

## 2022-05-03 NOTE — Progress Notes (Addendum)
MEDICARE ANNUAL WELLNESS VISIT  05/03/2022  Telephone Visit Disclaimer This Medicare AWV was conducted by telephone due to national recommendations for restrictions regarding the COVID-19 Pandemic (e.g. social distancing).  I verified, using two identifiers, that I am speaking with Mary Poole or their authorized healthcare agent. I discussed the limitations, risks, security, and privacy concerns of performing an evaluation and management service by telephone and the potential availability of an in-person appointment in the future. The patient expressed understanding and agreed to proceed.  Location of Patient: Home Location of Provider (nurse):  In the office.  Subjective:    Mary Poole is a 72 y.o. female patient of Mary Nutting, DO who had a Medicare Annual Wellness Visit today via telephone. Mary Poole is Retired and lives with their spouse. she does not have any children. she reports that she is socially active and does interact with friends/family regularly. she is minimally physically active and enjoys reading.  Patient Care Team: Mary Nutting, DO as PCP - General (Family Medicine) Mary Haw, MD as PCP - Electrophysiology (Cardiology) Mary Margarita, MD as PCP - Cardiology (Cardiology)     05/03/2022    2:32 PM 08/05/2021    9:10 AM 04/17/2021   12:39 PM 04/10/2021    1:16 PM 03/12/2021    8:13 AM 04/10/2020    3:15 PM 12/27/2018    3:21 PM  Advanced Directives  Does Patient Have a Medical Advance Directive? No No No Yes No Yes No  Type of Theatre stage manager of [place];Living will  Living will   Does patient want to make changes to medical advance directive?   No - Guardian declined No - Patient declined  No - Patient declined   Copy of [place] in Chart?    No - copy requested     Would patient like information on creating a medical advance directive? No - Patient declined No - Patient declined No - Patient declined  No -  Guardian declined  No - Patient declined    Hospital Utilization Over the Past 12 Months: # of hospitalizations or ER visits: 0 # of surgeries: 0  Review of Systems    Patient reports that her overall health is unchanged compared to last year.  History obtained from chart review and the patient  Patient Reported Readings (BP, Pulse, CBG, Weight, etc) none  Pain Assessment Pain : No/denies pain     Current Medications & Allergies (verified) Allergies as of 05/03/2022       Reactions   Diphenhydramine Hcl Hives, Other (See Comments)   Erratic heartbeat   Flecainide Rash        Medication List        Accurate as of May 03, 2022  2:50 PM. If you have any questions, ask your nurse or doctor.          acetaminophen 650 MG CR tablet Commonly known as: TYLENOL Take 650-1,300 mg by mouth every 8 (eight) hours as needed for pain.   diltiazem 240 MG 24 hr capsule Commonly known as: CARDIZEM CD TAKE 1 CAPSULE (240 MG TOTAL) BY MOUTH EVERY MORNING.   Eliquis 5 MG Tabs tablet Generic drug: apixaban TAKE 1 TABLET BY MOUTH TWICE A DAY   furosemide 20 MG tablet Commonly known as: LASIX TAKE 1 TABLET BY MOUTH EVERY DAY AS NEEDED FOR SWELLING   metoprolol succinate 50 MG 24 hr tablet Commonly known as: TOPROL-XL Take 1.5  tablets (75 mg total) by mouth daily. Take with or immediately following a meal.   multivitamin Liqd Take 30 mLs by mouth 3 (three) times a week. Nutriburst   omeprazole 40 MG capsule Commonly known as: PRILOSEC Take 1 capsule (40 mg total) by mouth daily. NO REFILLS. NEEDS AN APPT W/PCP.   PRESCRIPTION MEDICATION Inhale into the lungs at bedtime. CPAP        History (reviewed): Past Medical History:  Diagnosis Date   Anxiety    Atrial fibrillation ([place])    Complication of anesthesia    "woke up during colonoscopy 02/2017" (11/09/2017)   GERD (gastroesophageal reflux disease)    H/O atrial flutter    History of blood transfusion 02/2017    LGIB (11/09/2017)   Hypertension    Migraine    "a few/year" (11/09/2017)   OSA on CPAP    Past Surgical History:  Procedure Laterality Date   ATRIAL FIBRILLATION ABLATION N/A 11/09/2017   Procedure: ATRIAL FIBRILLATION ABLATION;  Surgeon: Mary Haw, MD;  Location: [facility];  Service: Cardiovascular;  Laterality: N/A;   ATRIAL FIBRILLATION ABLATION N/A 08/05/2021   Procedure: ATRIAL FIBRILLATION ABLATION;  Surgeon: Mary Haw, MD;  Location: [facility];  Service: Cardiovascular;  Laterality: N/A;   COLONOSCOPY N/A 02/24/2017   Procedure: COLONOSCOPY;  Surgeon: Mary Ada, MD;  Location: [facility];  Service: Gastroenterology;  Laterality: N/A;   ENTEROSCOPY N/A 02/24/2017   Procedure: ENTEROSCOPY;  Surgeon: Mary Ada, MD;  Location: [facility];  Service: Gastroenterology;  Laterality: N/A;   HERNIA REPAIR  1960s   WISDOM TOOTH EXTRACTION     Family History  Problem Relation Age of Onset   CAD Father    AAA (abdominal aortic aneurysm) Sister    Thyroid disease Sister    Allergies Other    Clotting disorder Other    Hypertension Other    Heart disease Other    Rheum arthritis Other    Migraines Other    Social History   Socioeconomic History   Marital status: Married    Spouse name: Mary Poole.   Number of children: 0   Years of education: 16   Highest education level: Bachelor's degree (e.g., BA, AB, BS)  Occupational History   Occupation: Retired  Tobacco Use   Smoking status: Former    Packs/day: 0.25    Years: 10.00    Additional pack years: 0.00    Total pack years: 2.50    Types: Cigarettes   Smokeless tobacco: Never   Tobacco comments:    11/09/2017 "quit smoking in the 1980s"  Vaping Use   Vaping Use: Never used  Substance and Sexual Activity   Alcohol use: Never   Drug use: Never   Sexual activity: Not Currently  Other Topics Concern   Not on file  Social History Narrative   Lives with her husband. She  enjoys reading in her free time.   Social Determinants of Health   Financial Resource Strain: Low Risk  (05/03/2022)   Overall Financial Resource Strain (CARDIA)    Difficulty of Paying Living Expenses: Not hard at all  Food Insecurity: No Food Insecurity (05/03/2022)   Hunger Vital Sign    Worried About Running Out of Food in the Last Year: Never true    Ran Out of Food in the Last Year: Never true  Transportation Needs: No Transportation Needs (05/03/2022)   PRAPARE - Hydrologist (Medical): No  Lack of Transportation (Non-Medical): No  Physical Activity: Inactive (05/03/2022)   Exercise Vital Sign    Days of Exercise per Week: 0 days    Minutes of Exercise per Session: 0 min  Stress: No Stress Concern Present (05/03/2022)   [place]    Feeling of Stress : Not at all  Social Connections: [place] (05/03/2022)   Social Connection and Isolation Panel [NHANES]    Frequency of Communication with Friends and Family: More than three times a week    Frequency of Social Gatherings with Friends and Family: More than three times a week    Attends Religious Services: More than 4 times per year    Active Member of [employer] or Organizations: Yes    Attends Archivist Meetings: More than 4 times per year    Marital Status: Married    Activities of Daily Living    05/03/2022    2:39 PM  In your present state of health, do you have any difficulty performing the following activities:  Hearing? 0  Vision? 0  Difficulty concentrating or making decisions? 0  Walking or climbing stairs? 0  Dressing or bathing? 0  Doing errands, shopping? 0  Preparing Food and eating ? N  Using the Toilet? N  In the past six months, have you accidently leaked urine? N  Do you have problems with loss of bowel control? N  Managing your Medications? N  Managing your Finances? N  Housekeeping or  managing your Housekeeping? N    Patient Education/ Literacy How often do you need to have someone help you when you read instructions, pamphlets, or other written materials from your doctor or pharmacy?: 1 - Never What is the last grade level you completed in school?: Bachelor's degree  Exercise Current Exercise Habits: The patient does not participate in regular exercise at present, Exercise limited by: None identified  Diet Patient reports consuming  1-5  meals a day and 1 snack(s) a day Patient reports that her primary diet is: Regular Patient reports that she does have regular access to food.   Depression Screen    05/03/2022    2:33 PM 04/10/2021    1:13 PM 04/10/2020    3:15 PM 12/27/2018    3:27 PM 10/26/2018    3:36 PM 07/20/2017    9:07 AM  PHQ 2/9 Scores  PHQ - 2 Score 0 0 0 0 1 0  PHQ- 9 Score   0  11      Fall Risk    05/03/2022    2:33 PM 04/10/2021    1:13 PM 04/10/2020    3:15 PM 12/27/2018    3:27 PM 07/20/2017    9:07 AM  [place] in the past year? 0 1 1 0 No  Number falls in past yr: 0 0 1 0   Injury with Fall? 0 1 0 0   Risk for fall due to : No Fall Risks History of fall(s) Impaired balance/gait    Follow up Falls evaluation completed Falls evaluation completed Falls evaluation completed Education provided;Falls prevention discussed      Objective:  ANJOLINA VALENTI seemed alert and oriented and she participated appropriately during our telephone visit.  Blood Pressure Weight BMI  BP Readings from Last 3 Encounters:  12/07/21 (!) 166/82  10/23/21 (!) 142/86  09/07/21 (!) 150/100   Wt Readings from Last 3 Encounters:  12/07/21 215 lb (97.5 kg)  09/07/21 216 lb (98 kg)  08/05/21 216 lb (98 kg)   BMI Readings from Last 1 Encounters:  12/07/21 35.78 kg/m    *Unable to obtain current vital signs, weight, and BMI due to telephone visit type  Hearing/Vision  Ramona did not seem to have difficulty with hearing/understanding during the telephone  conversation Reports that she has had a formal eye exam by an eye care professional within the past year Reports that she has not had a formal hearing evaluation within the past year *Unable to fully assess hearing and vision during telephone visit type  Cognitive Function:    05/03/2022    2:41 PM 04/10/2021    1:18 PM  6CIT Screen  What Year? 0 points 0 points  What month? 0 points 0 points  What time? 0 points 0 points  Count back from 20 0 points 0 points  Months in reverse 0 points 0 points  Repeat phrase 2 points 2 points  Total Score 2 points 2 points   (Normal:0-7, Significant for Dysfunction: >8)  Normal Cognitive Function Screening: Yes   Immunization & Health Maintenance Record Immunization History  Administered Date(s) Administered   PFIZER Comirnaty(Gray Top)Covid-19 Tri-Sucrose Vaccine 08/05/2020   PFIZER(Purple Top)SARS-COV-2 Vaccination 03/24/2019, 04/18/2019, 12/08/2019   Pfizer Covid-19 Vaccine Bivalent Booster 70yrs & up 12/19/2020   Zoster Recombinat (Shingrix) 07/12/2020, 03/24/2021    Health Maintenance  Topic Date Due   DTaP/Tdap/Td (1 - Tdap) Never done   COVID-19 Vaccine (6 - 2023-24 season) 05/19/2022 (Originally 10/09/2021)   Pneumonia Vaccine 36+ Years old (1 of 1 - PCV) 05/03/2023 (Originally 10/11/2015)   MAMMOGRAM  05/03/2023 (Originally 10/10/2000)   DEXA SCAN  05/03/2023 (Originally 10/11/2015)   Hepatitis C Screening  05/03/2023 (Originally 10/10/1968)   Medicare Annual Wellness (AWV)  05/03/2023   COLONOSCOPY (Pts 45-38yrs Insurance coverage will need to be confirmed)  02/25/2027   Zoster Vaccines- Shingrix  Completed   HPV VACCINES  Aged Out   INFLUENZA VACCINE  Discontinued       Assessment  This is a routine wellness examination for Mary Poole.  Health Maintenance: Due or Overdue Health Maintenance Due  Topic Date Due   DTaP/Tdap/Td (1 - Tdap) Never done    Mary Poole does not need a referral for Community Assistance: Care  Management:   no Social Work:    no Prescription Assistance:  no Nutrition/Diabetes Education:  no   Plan:  Personalized Goals  Goals Addressed               This Visit's Progress     Patient Stated (pt-stated)        Patient stated that she would like to work on weight loss.       Personalized Health Maintenance & Screening Recommendations  Pneumococcal vaccine  Td vaccine Screening mammography Bone densitometry screening  Lung Cancer Screening Recommended: no (Low Dose CT Chest recommended if Age 9-80 years, 30 pack-year currently smoking OR have quit w/in past 15 years) Hepatitis C Screening recommended: yes HIV Screening recommended: no  Advanced Directives: Written information was not prepared per patient's request.  Referrals & Orders Orders Placed This Encounter  Procedures   [place]    Follow-up Plan Follow-up with Mary Nutting, DO as planned Schedule tetanus vaccine at the pharmacy.  Bring your record for pneumonia vaccine.  Medicare wellness visit in one year. AVS printed and mailed to the patient.   I have personally reviewed  and noted the following in the patient's chart:   Medical and social history Use of alcohol, tobacco or illicit drugs  Current medications and supplements Functional ability and status Nutritional status Physical activity Advanced directives List of other physicians Hospitalizations, surgeries, and ER visits in previous 12 months Vitals Screenings to include cognitive, depression, and falls Referrals and appointments  In addition, I have reviewed and discussed with Mary Poole certain preventive protocols, quality metrics, and best practice recommendations. A written personalized care plan for preventive services as well as general preventive health recommendations is available and can be mailed to the patient at her request.      Tinnie Gens, RN BSN  05/03/2022

## 2022-05-04 NOTE — Telephone Encounter (Signed)
Caller returned Pre-op call and stated patient will be having 1 tooth (Upper right) extracted.

## 2022-05-04 NOTE — Telephone Encounter (Signed)
Left message x 2 to the DDS office to confirm how many teeth are being extracted and what type of anesthesia being used.

## 2022-05-04 NOTE — Telephone Encounter (Signed)
   Primary Cardiologist: Fransico Him, MD  Chart reviewed as part of pre-operative protocol coverage. Simple dental extractions are considered low risk procedures per guidelines and generally do not require any specific cardiac clearance. It is also generally accepted that for simple extractions and dental cleanings, there is no need to interrupt blood thinner therapy.   SBE prophylaxis is not required for the patient.  I will route this recommendation to the requesting party via Epic fax function and remove from pre-op pool.  Please call with questions.  Emmaline Life, NP-C  05/04/2022, 9:48 AM [address] Office [phone] Fax [phone]

## 2022-05-13 DIAGNOSIS — R0982 Postnasal drip: Secondary | ICD-10-CM | POA: Diagnosis not present

## 2022-05-13 DIAGNOSIS — R062 Wheezing: Secondary | ICD-10-CM | POA: Diagnosis not present

## 2022-05-13 DIAGNOSIS — R0981 Nasal congestion: Secondary | ICD-10-CM | POA: Diagnosis not present

## 2022-05-13 DIAGNOSIS — R051 Acute cough: Secondary | ICD-10-CM | POA: Diagnosis not present

## 2022-05-13 DIAGNOSIS — J029 Acute pharyngitis, unspecified: Secondary | ICD-10-CM | POA: Diagnosis not present

## 2022-05-25 ENCOUNTER — Other Ambulatory Visit: Payer: Self-pay | Admitting: Family Medicine

## 2022-05-27 ENCOUNTER — Other Ambulatory Visit: Payer: Self-pay | Admitting: Cardiology

## 2022-05-27 ENCOUNTER — Telehealth: Payer: Self-pay | Admitting: Cardiology

## 2022-05-27 ENCOUNTER — Encounter (INDEPENDENT_AMBULATORY_CARE_PROVIDER_SITE_OTHER): Payer: Medicare Other | Admitting: Internal Medicine

## 2022-05-27 MED ORDER — APIXABAN 5 MG PO TABS: 5.0000 mg | ORAL_TABLET | Freq: Two times a day (BID) | ORAL | 5 refills | Status: DC

## 2022-05-27 NOTE — Telephone Encounter (Signed)
Prescription refill request for Eliquis received. Indication: afib  Last office visit: [place], 12/07/2021 Scr: 1.11, 07/16/2021 Age: 72 yo  Weight:  97.5 kg   Refill sent.

## 2022-05-27 NOTE — Telephone Encounter (Signed)
*  STAT* If patient is at the pharmacy, call can be transferred to refill team.   1. Which medications need to be refilled? (please list name of each medication and dose if known)   apixaban (ELIQUIS) 5 MG TABS tablet   2. Which pharmacy/location (including street and city if local pharmacy) is medication to be sent to? [id number] - [place], [place] - [address]   3. Do they need a 30 day or 90 day supply? 30 day   Pt out of medication

## 2022-05-29 ENCOUNTER — Other Ambulatory Visit: Payer: Self-pay | Admitting: Family Medicine

## 2022-05-31 NOTE — Telephone Encounter (Signed)
Lvm for patient to call back to schedule an appointment with Dr. Ashley Royalty, was seen last 04/10/21. tvt

## 2022-05-31 NOTE — Telephone Encounter (Signed)
Please contact the patient to schedule with Dr. Ashley Royalty. Last visit 04/10/21. Thanks

## 2022-06-04 ENCOUNTER — Ambulatory Visit: Payer: Medicare Other | Admitting: Physician Assistant

## 2022-06-09 ENCOUNTER — Other Ambulatory Visit: Payer: Self-pay | Admitting: Family Medicine

## 2022-06-13 NOTE — Progress Notes (Deleted)
Cardiology Office Note Date:  06/13/2022  Patient ID:  Mary Poole, Mary Poole 03/22/1950, MRN [id number] PCP:  Everrett Coombe, DO  OSA: Dr. Mayford Knife EP: Dr. [name]    Chief Complaint: *** 6 mo  History of Present Illness: Mary Poole is a 72 y.o. female with history of GERD, HTN, OSA, migraines, anxiety, AFib, Aflutter, morbid obesity  She saw Dr. [name] 12/07/21, doing well, no symptoms of her arrhythmia, was active, BP was up, advised to monitor at hom, CPAP compliance encouraged, no changes were made.  *** eliquis, bleeding, dose, labs *** burden, symptoms   Afib Hx Diagnosed 2018 Has been seen to have a WCT that was felt when slowed to have been an Aflutter 11/09/2017: PVI and CTI ablation 08/05/21: PVI ablation  AAD None to date   Past Medical History:  Diagnosis Date   Anxiety    Atrial fibrillation (HCC)    Complication of anesthesia    "woke up during colonoscopy 02/2017" (11/09/2017)   GERD (gastroesophageal reflux disease)    H/O atrial flutter    History of blood transfusion 02/2017   LGIB (11/09/2017)   Hypertension    Migraine    "a few/year" (11/09/2017)   OSA on CPAP     Past Surgical History:  Procedure Laterality Date   ATRIAL FIBRILLATION ABLATION N/A 11/09/2017   Procedure: ATRIAL FIBRILLATION ABLATION;  Surgeon: Regan Lemming, MD;  Location: [facility];  Service: Cardiovascular;  Laterality: N/A;   ATRIAL FIBRILLATION ABLATION N/A 08/05/2021   Procedure: ATRIAL FIBRILLATION ABLATION;  Surgeon: Regan Lemming, MD;  Location: [facility];  Service: Cardiovascular;  Laterality: N/A;   COLONOSCOPY N/A 02/24/2017   Procedure: COLONOSCOPY;  Surgeon: Jeani Hawking, MD;  Location: [facility] ENDOSCOPY;  Service: Gastroenterology;  Laterality: N/A;   ENTEROSCOPY N/A 02/24/2017   Procedure: ENTEROSCOPY;  Surgeon: Jeani Hawking, MD;  Location: [facility] ENDOSCOPY;  Service: Gastroenterology;  Laterality: N/A;   HERNIA REPAIR  1960s   WISDOM TOOTH  EXTRACTION      Current Outpatient Medications  Medication Sig Dispense Refill   acetaminophen (TYLENOL) 650 MG CR tablet Take 650-1,300 mg by mouth every 8 (eight) hours as needed for pain.     apixaban (ELIQUIS) 5 MG TABS tablet Take 1 tablet (5 mg total) by mouth 2 (two) times daily. 60 tablet 5   diltiazem (CARDIZEM CD) 240 MG 24 hr capsule TAKE 1 CAPSULE (240 MG TOTAL) BY MOUTH EVERY MORNING. 90 capsule 3   furosemide (LASIX) 20 MG tablet TAKE 1 TABLET BY MOUTH EVERY DAY AS NEEDED FOR SWELLING 90 tablet 1   metoprolol succinate (TOPROL-XL) 50 MG 24 hr tablet Take 1.5 tablets (75 mg total) by mouth daily. Take with or immediately following a meal. 135 tablet 2   Multiple Vitamin (MULTIVITAMIN) LIQD Take 30 mLs by mouth 3 (three) times a week. Nutriburst     omeprazole (PRILOSEC) 40 MG capsule TAKE 1 CAPSULE (40 MG TOTAL) BY MOUTH DAILY. NO REFILLS. NEEDS AN APPT W/PCP. 90 capsule 1   PRESCRIPTION MEDICATION Inhale into the lungs at bedtime. CPAP     No current facility-administered medications for this visit.    Allergies:   Diphenhydramine hcl and Flecainide   Social History:  The patient  reports that she has quit smoking. Her smoking use included cigarettes. She has a 2.50 pack-year smoking history. She has never used smokeless tobacco. She reports that she does not drink alcohol and does not use drugs.  Family History:  The patient's family history includes AAA (abdominal aortic aneurysm) in her sister; Allergies in an other family member; CAD in her father; Clotting disorder in an other family member; Heart disease in an other family member; Hypertension in an other family member; Migraines in an other family member; Rheum arthritis in an other family member; Thyroid disease in her sister.  ROS:  Please see the history of present illness. All other systems are reviewed and otherwise negative.   PHYSICAL EXAM:  VS:  There were no vitals taken for this visit. BMI: There is no height  or weight on file to calculate BMI. Well nourished, well developed, in no acute distress  HEENT: normocephalic, atraumatic  Neck: no JVD, carotid bruits or masses Cardiac: *** RRR; no significant murmurs, no rubs, or gallops Lungs: ***  CTA b/l, no wheezing, rhonchi or rales  Abd: soft, nontender, obese MS: no deformity or atrophy Ext: *** no edema  Skin: warm and dry, no rash Neuro:  No gross deficits appreciated Psych: euthymic mood, full affect   EKG:  Done today and reviewed by myself:  ***  08/05/2021: EPS/Ablation CONCLUSIONS: 1. Atrial fibrillation upon presentation.   2. Successful electrical isolation and anatomical encircling of all four pulmonary veins with radiofrequency current.  A WACA approach was used 3. Additional left atrial ablation was performed with a standard box lesion created along the posterior wall of the left atrium 4. Atrial fibrillation successfully cardioverted to sinus rhythm. 5. No early apparent complications.    11/09/2017: EPS/ablation CONCLUSIONS: 1. Sinus rhythm upon presentation.   2. Successful electrical isolation and anatomical encircling of all four pulmonary veins with radiofrequency current.    3. Cavo-tricuspid isthmus ablation was performed with complete bidirectional isthmus block achieved.  4. No inducible arrhythmias following ablation both on and off of dobutamine 5. No early apparent complications.   09/18/2017: TTE Study Conclusions  - Left ventricle: The cavity size was normal. Wall thickness was    increased in a pattern of mild LVH. Systolic function was normal.    The estimated ejection fraction was in the range of 55% to 60%.    Wall motion was normal; there were no regional wall motion    abnormalities. Doppler parameters are consistent with restrictive    physiology, indicative of decreased left ventricular diastolic    compliance and/or increased left atrial pressure.  - Mitral valve: Calcified annulus. There was  moderate    regurgitation.  - Left atrium: The atrium was severely dilated.  - Atrial septum: The septum bowed from left to right, consistent    with increased left atrial pressure.  - Pulmonary arteries: Systolic pressure was moderately increased.    PA peak pressure: 52 mm Hg (S).   Impressions:  - Normal LV function; mild LVH; restrictive filling; moderate MR;    severe LAE; mild TR with moderate pulmonary hypertension.    10/06/2016: stress myoview Narrative & Impression Nuclear stress EF: 58%. The left ventricular ejection fraction is normal (55-65%). The study is normal. There is breast attenuation. No ischemia . no evidence of infarction This is a low risk study.   Recent Labs: 07/16/2021: BUN 19; Creatinine, Ser 1.11; Hemoglobin 14.1; Platelets 253; Potassium 4.5; Sodium 138  No results found for requested labs within last 365 days.   CrCl cannot be calculated (Patient's most recent lab result is older than the maximum 21 days allowed.).   Wt Readings from Last 3 Encounters:  12/07/21 215 lb (97.5 kg)  09/07/21 216 lb (98 kg)  08/05/21 216 lb (98 kg)     Other studies reviewed: Additional studies/records reviewed today include: summarized above  ASSESSMENT AND PLAN:  1. Paroxysmal AFib and flutter     S/p PVI/CTI ablation     CHA2DS2Vasc is *** 3, on Eliquis, *** appropriately dosed     ***  2. HTN     ***        Disposition: ***  Current medicines are reviewed at length with the patient today.  The patient did not have any concerns regarding medicines.  Norma Fredrickson, PA-C 06/13/2022 4:32 PM     [facility] [address] [place] [address] [phone] (office)  [phone] (fax)

## 2022-06-16 ENCOUNTER — Ambulatory Visit: Payer: Medicare Other | Admitting: Physician Assistant

## 2022-06-17 ENCOUNTER — Encounter (INDEPENDENT_AMBULATORY_CARE_PROVIDER_SITE_OTHER): Payer: Self-pay | Admitting: Internal Medicine

## 2022-06-17 ENCOUNTER — Ambulatory Visit (INDEPENDENT_AMBULATORY_CARE_PROVIDER_SITE_OTHER): Payer: Medicare Other | Admitting: Internal Medicine

## 2022-06-17 VITALS — BP 139/84 | HR 54 | Temp 98.3°F | Ht 64.0 in | Wt 214.0 lb

## 2022-06-17 DIAGNOSIS — I10 Essential (primary) hypertension: Secondary | ICD-10-CM | POA: Diagnosis not present

## 2022-06-17 DIAGNOSIS — R7303 Prediabetes: Secondary | ICD-10-CM | POA: Diagnosis not present

## 2022-06-17 DIAGNOSIS — I480 Paroxysmal atrial fibrillation: Secondary | ICD-10-CM | POA: Diagnosis not present

## 2022-06-17 DIAGNOSIS — G4733 Obstructive sleep apnea (adult) (pediatric): Secondary | ICD-10-CM | POA: Diagnosis not present

## 2022-06-17 DIAGNOSIS — Z6836 Body mass index (BMI) 36.0-36.9, adult: Secondary | ICD-10-CM

## 2022-06-17 DIAGNOSIS — R944 Abnormal results of kidney function studies: Secondary | ICD-10-CM

## 2022-06-17 NOTE — Assessment & Plan Note (Addendum)
Blood pressure is not at goal.  She is currently on diltiazem, metoprolol and furosemide as needed.  Metoprolol may contribute to weight gain and actually correlates with the timing of patient's weight gain.  I also reviewed most recent renal parameters which shows a decrease in GFR for several consecutive readings.  She may have stage III chronic kidney disease possibly hypertensive.  She is also been exposed to contrast on 3 occasions.  We will check renal parameters with intake labs she was advised to avoid nephrotoxins and improve hydration.  We will also look at imaging studies.  Losing 10% of body weight may improve blood pressure control.  Considering age and kidney disease we should try to achieve blood pressure control of less than 130/80 if tolerated.  If we indeed confirm that she does have CKD I will discuss with primary care team for further evaluation or referral to a nephrologist.

## 2022-06-17 NOTE — Assessment & Plan Note (Signed)
With history of ablation.  She is currently on anticoagulation and denies any bleeding complications.  She is also on metoprolol which may cause weight gain.  She will continue current regimen

## 2022-06-17 NOTE — Assessment & Plan Note (Signed)
On CPAP with reported good compliance. Continue PAP therapy. Losing 15% or more of body weight may improve AHI.    

## 2022-06-17 NOTE — Progress Notes (Signed)
Office: [phone]  /  Fax: [phone]   Initial Visit  Mary Poole was seen in clinic today to evaluate for obesity. She is interested in losing weight to improve overall health and reduce the risk of weight related complications. She presents today to review program treatment options, initial physical assessment, and evaluation.     She was referred by: Specialist Cardiologist  When asked what else they would like to accomplish? She states: Improve energy levels and physical activity, Improve existing medical conditions, Improve quality of life, Improve appearance, and Improve self-confidence  Weight history: started to gain weight last 5-7 years.  When asked how has your weight affected you? She states: Has affected self-esteem, Relationships, Contributed to medical problems, Contributed to orthopedic problems or mobility issues, Having fatigue, and Having poor endurance  Some associated conditions: Hypertension, OSA, Prediabetes, and Other: AFIB  Contributing factors: Family history, Disruption of circadian rhythm, Medications, Reduced physical activity, and Menopause  Weight promoting medications identified: Beta-blockers  Current nutrition plan: None  Current level of physical activity: None  Current or previous pharmacotherapy: None  Response to medication: Never tried medications   Past medical history includes:   Past Medical History:  Diagnosis Date   Anxiety    Atrial fibrillation (HCC)    Complication of anesthesia    "woke up during colonoscopy 02/2017" (11/09/2017)   GERD (gastroesophageal reflux disease)    H/O atrial flutter    History of blood transfusion 02/2017   LGIB (11/09/2017)   Hypertension    Migraine    "a few/year" (11/09/2017)   OSA on CPAP      Objective:   BP 139/84   Pulse (!) 54   Temp 98.3 F (36.8 C)   Ht 5\' 4"  (1.626 m)   Wt 214 lb (97.1 kg)   SpO2 98%   BMI 36.73 kg/m  She was weighed on the bioimpedance scale: Body  mass index is 36.73 kg/m.    General:  Alert, oriented and cooperative. Patient is in no acute distress.  Respiratory: Normal respiratory effort, no problems with respiration noted   Gait: able to ambulate independently  Mental Status: Normal mood and affect. Normal behavior. Normal judgment and thought content.   DIAGNOSTIC DATA REVIEWED:  BMET    Component Value Date/Time   NA 138 07/16/2021 1445   K 4.5 07/16/2021 1445   CL 103 07/16/2021 1445   CO2 26 07/16/2021 1445   GLUCOSE 100 (H) 07/16/2021 1445   GLUCOSE 109 (H) 04/17/2021 1258   BUN 19 07/16/2021 1445   CREATININE 1.11 (H) 07/16/2021 1445   CALCIUM 9.9 07/16/2021 1445   GFRNONAA 48 (L) 04/17/2021 1258   GFRAA 75 09/24/2019 1518   Lab Results  Component Value Date   HGBA1C 6.1 10/23/2021   No results found for: "INSULIN" CBC    Component Value Date/Time   WBC 9.4 07/16/2021 1445   WBC 8.5 04/17/2021 1258   RBC 5.18 07/16/2021 1445   RBC 5.33 (H) 04/17/2021 1258   HGB 14.1 07/16/2021 1445   HCT 42.5 07/16/2021 1445   PLT 253 07/16/2021 1445   MCV 82 07/16/2021 1445   MCH 27.2 07/16/2021 1445   MCH 27.6 04/17/2021 1258   MCHC 33.2 07/16/2021 1445   MCHC 32.1 04/17/2021 1258   RDW 17.5 (H) 07/16/2021 1445   Iron/TIBC/Ferritin/ %Sat    Component Value Date/Time   IRON 29 07/20/2017 0932   TIBC 335 07/20/2017 0932   FERRITIN 13.9 07/20/2017 1308  IRONPCTSAT 9 (LL) 07/20/2017 0932   Lipid Panel  No results found for: "CHOL", "TRIG", "HDL", "CHOLHDL", "VLDL", "LDLCALC", "LDLDIRECT" Hepatic Function Panel     Component Value Date/Time   PROT 7.2 03/12/2021 0839   ALBUMIN 3.5 03/12/2021 0839   AST 43 (H) 03/12/2021 0839   ALT 40 03/12/2021 0839   ALKPHOS 71 03/12/2021 0839   BILITOT 1.3 (H) 03/12/2021 0839      Component Value Date/Time   TSH 0.636 03/11/2021 1628   TSH 0.696 09/24/2019 1518     Assessment and Plan:   Essential hypertension Assessment & Plan: Blood pressure is not at  goal.  She is currently on diltiazem, metoprolol and furosemide as needed.  Metoprolol may contribute to weight gain and actually correlates with the timing of patient's weight gain.  I also reviewed most recent renal parameters which shows a decrease in GFR for several consecutive readings.  She may have stage III chronic kidney disease possibly hypertensive.  She is also been exposed to contrast on 3 occasions.  We will check renal parameters with intake labs she was advised to avoid nephrotoxins and improve hydration.  Losing 10% of body weight may improve blood pressure control.  Considering age and kidney disease we should try to achieve blood pressure control of less than 130/80 if tolerated.   Paroxysmal atrial fibrillation [facility]) Assessment & Plan: With history of ablation.  She is currently on anticoagulation and denies any bleeding complications.  She is also on metoprolol which may cause weight gain.  She will continue current regimen   OSA (obstructive sleep apnea) on CPAP Assessment & Plan: On CPAP with reported good compliance. Continue PAP therapy. Losing 15% or more of body weight may improve AHI.      Prediabetes Assessment & Plan: Most recent A1c is  Lab Results  Component Value Date   HGBA1C 6.1 10/23/2021  . Patient informed of disease state and risk of progression. This may contribute to abnormal cravings, fatigue and diabetes complications without having diabetes.   We reviewed treatment options which include losing 7 to 10% of body weight, increasing physical activity to a 150 minutes a week of moderate intensity.She may also be a candidate for pharmacoprophylaxis with metformin or incretin mimetic.     Decreased GFR Assessment & Plan: Blood pressure is not at goal.  She is currently on diltiazem, metoprolol and furosemide as needed.  Metoprolol may contribute to weight gain and actually correlates with the timing of patient's weight gain.  I also reviewed most  recent renal parameters which shows a decrease in GFR for several consecutive readings.  She may have stage III chronic kidney disease possibly hypertensive.  She is also been exposed to contrast on 3 occasions.  We will check renal parameters with intake labs she was advised to avoid nephrotoxins and improve hydration.  We will also look at imaging studies.  Losing 10% of body weight may improve blood pressure control.  Considering age and kidney disease we should try to achieve blood pressure control of less than 130/80 if tolerated.  If we indeed confirm that she does have CKD I will discuss with primary care team for further evaluation or referral to a nephrologist.   Class 2 severe obesity with serious comorbidity and body mass index (BMI) of 36.0 to 36.9 in adult, unspecified obesity type (HCC)        Obesity Treatment / Action Plan:  Patient will work on garnering support from family and friends to  begin weight loss journey. Will work on eliminating or reducing the presence of highly palatable, calorie dense foods in the home. Will complete provided nutritional and psychosocial assessment questionnaire before the next appointment. Will be scheduled for indirect calorimetry to determine resting energy expenditure in a fasting state.  This will allow [place] to create a reduced calorie, high-protein meal plan to promote loss of fat mass while preserving muscle mass. Counseled on the health benefits of losing 5%-15% of total body weight. Was counseled on nutritional approaches to weight loss and benefits of complex carbs and high quality protein as part of nutritional weight management. Was counseled on pharmacotherapy and role as an adjunct in weight management.  Will work on increasing water intake with a goal of 125 ounces for men and 91 ounces for women.   Obesity Education Performed Today:  She was weighed on the bioimpedance scale and results were discussed and documented in the  synopsis.  We discussed obesity as a disease and the importance of a more detailed evaluation of all the factors contributing to the disease.  We discussed the importance of long term lifestyle changes which include nutrition, exercise and behavioral modifications as well as the importance of customizing this to her specific health and social needs.  We discussed the benefits of reaching a healthier weight to alleviate the symptoms of existing conditions and reduce the risks of the biomechanical, metabolic and psychological effects of obesity.  ILLEANNA MAUZY appears to be in the action stage of change and states they are ready to start intensive lifestyle modifications and behavioral modifications.  40 minutes was spent today on this visit including the above counseling, pre-visit chart review, and post-visit documentation.  Reviewed by clinician on day of visit: allergies, medications, problem list, medical history, surgical history, family history, social history, and previous encounter notes pertinent to obesity diagnosis.   Worthy Rancher, MD

## 2022-06-17 NOTE — Assessment & Plan Note (Signed)
Most recent A1c is  Lab Results  Component Value Date   HGBA1C 6.1 10/23/2021  . Patient informed of disease state and risk of progression. This may contribute to abnormal cravings, fatigue and diabetes complications without having diabetes.   We reviewed treatment options which include losing 7 to 10% of body weight, increasing physical activity to a 150 minutes a week of moderate intensity.She may also be a candidate for pharmacoprophylaxis with metformin or incretin mimetic.

## 2022-06-17 NOTE — Assessment & Plan Note (Signed)
Blood pressure is not at goal.  She is currently on diltiazem, metoprolol and furosemide as needed.  Metoprolol may contribute to weight gain and actually correlates with the timing of patient's weight gain.  I also reviewed most recent renal parameters which shows a decrease in GFR for several consecutive readings.  She may have stage III chronic kidney disease possibly hypertensive.  She is also been exposed to contrast on 3 occasions.  We will check renal parameters with intake labs she was advised to avoid nephrotoxins and improve hydration.  Losing 10% of body weight may improve blood pressure control.  Considering age and kidney disease we should try to achieve blood pressure control of less than 130/80 if tolerated.

## 2022-06-18 ENCOUNTER — Telehealth: Payer: Self-pay | Admitting: Family Medicine

## 2022-06-18 NOTE — Telephone Encounter (Signed)
Pls call pt and let her remind her to schedule her mammo. Order is in the computer already. She can schedule via MyChart as well

## 2022-06-21 NOTE — Telephone Encounter (Signed)
Patient informed and will call and schld MMG.

## 2022-06-23 ENCOUNTER — Ambulatory Visit: Payer: Medicare Other | Admitting: Student

## 2022-07-13 DIAGNOSIS — L2389 Allergic contact dermatitis due to other agents: Secondary | ICD-10-CM | POA: Diagnosis not present

## 2022-07-20 NOTE — Progress Notes (Deleted)
  Electrophysiology Office Note:   Date:  07/20/2022  ID:  Mary Poole, DOB 28-May-1950, MRN [id number]  Primary Cardiologist: Armanda Magic, MD Electrophysiologist: Regan Lemming, MD  {Click to update primary MD,subspecialty MD or APP then REFRESH:1}    History of Present Illness:   Mary Poole is a 72 y.o. female with h/o HTN, Palpitations, and AF seen today for routine electrophysiology followup.  Since last being seen in our clinic the patient reports doing ***.  she denies chest pain, palpitations, dyspnea, PND, orthopnea, nausea, vomiting, dizziness, syncope, edema, weight gain, or early satiety.      AF history S/p Ablation 10/20219 Redo ablation 08/05/2021       Review of systems complete and found to be negative unless listed in HPI.   Studies Reviewed:    {EKGtoday:28818::"EKG is not ordered today"}  ***  Risk Assessment/Calculations:   {Does this patient have ATRIAL FIBRILLATION?:[id number]} No BP recorded.  {Refresh Note OR Click here to enter BP  :1}***        Physical Exam:   VS:  There were no vitals taken for this visit.   Wt Readings from Last 3 Encounters:  06/17/22 214 lb (97.1 kg)  12/07/21 215 lb (97.5 kg)  09/07/21 216 lb (98 kg)     GEN: Well nourished, well developed in no acute distress NECK: No JVD; No carotid bruits CARDIAC: {EPRHYTHM:28826}, no murmurs, rubs, gallops RESPIRATORY:  Clear to auscultation without rales, wheezing or rhonchi  ABDOMEN: Soft, non-tender, non-distended EXTREMITIES:  No edema; No deformity   ASSESSMENT AND PLAN:    Paroxysmal Atrial Fibrillation  S/p Ablation 11/2017 and 08/05/2021 EKG today shows *** Continue Eliquis for CHA2DS2VASC of at least 3 Continue Toprol   and diltiazem   HTN Stable on current regimen   OSA  Encouraged nightly CPAP   Obesity There is no height or weight on file to calculate BMI.  Encouraged lifestyle modification   {Click here to Review PMH, Prob List, Meds, Allergies,  SHx, FHx  :1}   Follow up with {WUJWJ:19147} {EPFOLLOW WG:95621}  Signed, Graciella Freer, PA-C

## 2022-07-22 ENCOUNTER — Ambulatory Visit: Payer: Medicare Other | Admitting: Student

## 2022-07-22 DIAGNOSIS — I4819 Other persistent atrial fibrillation: Secondary | ICD-10-CM

## 2022-07-22 DIAGNOSIS — I4891 Unspecified atrial fibrillation: Secondary | ICD-10-CM

## 2022-07-22 DIAGNOSIS — I10 Essential (primary) hypertension: Secondary | ICD-10-CM

## 2022-08-26 NOTE — Progress Notes (Signed)
Electrophysiology Office Note:   Date:  08/27/2022  ID:  Mary Poole, DOB [birth year], MRN [id number]  Primary Cardiologist: Armanda Magic, MD Electrophysiologist: Regan Lemming, MD      History of Present Illness:   Mary Poole is a 72 y.o. female with h/o HTN, PAF, accessory AV pathway, AFL, Bradycardia, OSA, GERD,  seen today for routine electrophysiology followup.    Since last being seen in our clinic the patient reports doing well in regards to her AF.  She has not had any known AF since her ablation. She shares she has had a lot of life stressors in the last few months - her husband had an MI and was hospitalized, an aunt passed away and she has been stress eating. She feels she has gained weight. Asks if any of her medications would cause insomnia. She does not use her PRN lasix.   She denies chest pain, palpitations, dyspnea, PND, orthopnea, nausea, vomiting, dizziness, syncope, edema, or early satiety.   Review of systems complete and found to be negative unless listed in HPI.   EP Information / Studies Reviewed:    EKG is ordered today. Personal review as below.      System down > personal review of EKG SB, rate 57, short PR  Studies:  ECHO 09/2017 > LVEF 55-60%, no RWMA, moderate MVR, LA severely dilated, increased LA pressure, PA systolic moderately increased, 52.  Cardiac CT 07/2021 > normal pulm vein drainage into LA, dilated LA appendage w/o thrombus, calcium score 197/85th percentile   Arrhythmia / AAD Hx:  Atrial Fibrillation 2018 PVI Ablation 11/2017  Re-do PVI Ablation 07/2021  Risk Assessment/Calculations:    CHA2DS2-VASc Score = 4   This indicates a 4.8% annual risk of stroke. The patient's score is based upon: CHF History: 0 HTN History: 1 Diabetes History: 0 Stroke History: 0 Vascular Disease History: 1 Age Score: 1 Gender Score: 1    HYPERTENSION CONTROL Vitals:   08/27/22 1015 08/27/22 1105  BP: (!) 160/82 (!) 150/84    The patient's  blood pressure is elevated above target today.  In order to address the patient's elevated BP: Follow up with primary care provider for management.; Blood pressure will be monitored at home to determine if medication changes need to be made.           Physical Exam:   VS:  BP (!) 150/84   Pulse (!) 57   Ht 5\' 4"  (1.626 m)   Wt 221 lb (100.2 kg)   SpO2 99%   BMI 37.93 kg/m    Wt Readings from Last 3 Encounters:  08/27/22 221 lb (100.2 kg)  06/17/22 214 lb (97.1 kg)  12/07/21 215 lb (97.5 kg)     GEN: Well nourished, well developed in no acute distress NECK: No JVD; No carotid bruits CARDIAC: Regular rate and rhythm, no murmurs, rubs, gallops RESPIRATORY:  Clear to auscultation without rales, wheezing or rhonchi  ABDOMEN: Soft, non-tender, non-distended EXTREMITIES:  No edema; No deformity   ASSESSMENT AND PLAN:    Paroxysmal Atrial Fibrillation / Flutter  CHA2DSVasc 4.  PVI ablation with re-do.  -continue metoprolol, diltiazem  -continue eliquis for stroke prevention   Secondary Hypercoagulable State  -eliquis as above, dosing reviewed / appropriate by age and weight   HTN  -BP elevated at current visit  -asked patient to check her blood pressure at home this week and let me know if her trend is >140 systolic. She states she normally  runs 110's to 120's at home when she checks it.  Ate shrimp fried rice last night and thinks that contributed to it being high.  -consider medication change if BP remains elevated (note brady on toprol), if elevated, coreg for more BP effect   OSA  -CPAP compliance encouraged   Obesity  -discussed starting with simple weight loss options > such as eliminating her fruit punch / reducing her sugar load. Discussed in detail the concept of "eating closer to the ground"  Insomnia  -limited options with OSA as do not want to oversedate  -discuss with PCP    Follow up with Dr. [name] in 12 months  Signed, Canary Brim, MSN, APRN, NP-C,  AGACNP-BC [facility] - Electrophysiology  08/27/2022, 11:09 AM

## 2022-08-27 ENCOUNTER — Encounter: Payer: Self-pay | Admitting: Student

## 2022-08-27 ENCOUNTER — Ambulatory Visit: Payer: Medicare Other | Attending: Physician Assistant | Admitting: Pulmonary Disease

## 2022-08-27 VITALS — BP 150/84 | HR 57 | Ht 64.0 in | Wt 221.0 lb

## 2022-08-27 DIAGNOSIS — I10 Essential (primary) hypertension: Secondary | ICD-10-CM | POA: Diagnosis not present

## 2022-08-27 DIAGNOSIS — D6869 Other thrombophilia: Secondary | ICD-10-CM

## 2022-08-27 DIAGNOSIS — I4819 Other persistent atrial fibrillation: Secondary | ICD-10-CM | POA: Diagnosis not present

## 2022-08-27 NOTE — Patient Instructions (Signed)
Medication Instructions:  Your physician recommends that you continue on your current medications as directed. Please refer to the Current Medication list given to you today.  *If you need a refill on your cardiac medications before your next appointment, please call your pharmacy*  Lab Work: BMET, CBC--TODAY If you have labs (blood work) drawn today and your tests are completely normal, you will receive your results only by: MyChart Message (if you have MyChart) OR A paper copy in the mail If you have any lab test that is abnormal or we need to change your treatment, we will call you to review the results.   Follow-Up: At [facility], you and your health needs are our priority.  As part of our continuing mission to provide you with exceptional heart care, we have created designated Provider Care Teams.  These Care Teams include your primary Cardiologist (physician) and Advanced Practice Providers (APPs -  Physician Assistants and Nurse Practitioners) who all work together to provide you with the care you need, when you need it.  Your next appointment:   1 year(s)  Provider:   Loman Brooklyn, MD

## 2022-08-28 LAB — CBC
Hematocrit: 39.1 % (ref 34.0–46.6)
Hemoglobin: 12.8 g/dL (ref 11.1–15.9)
MCH: 28.1 pg (ref 26.6–33.0)
MCHC: 32.7 g/dL (ref 31.5–35.7)
MCV: 86 fL (ref 79–97)
Platelets: 248 10*3/uL (ref 150–450)
RBC: 4.55 x10E6/uL (ref 3.77–5.28)
RDW: 14.5 % (ref 11.7–15.4)
WBC: 8.5 10*3/uL (ref 3.4–10.8)

## 2022-08-28 LAB — BASIC METABOLIC PANEL
BUN/Creatinine Ratio: 13 (ref 12–28)
BUN: 12 mg/dL (ref 8–27)
CO2: 26 mmol/L (ref 20–29)
Calcium: 9.7 mg/dL (ref 8.7–10.3)
Chloride: 101 mmol/L (ref 96–106)
Creatinine, Ser: 0.95 mg/dL (ref 0.57–1.00)
Glucose: 94 mg/dL (ref 70–99)
Potassium: 3.9 mmol/L (ref 3.5–5.2)
Sodium: 140 mmol/L (ref 134–144)
eGFR: 64 mL/min/{1.73_m2} (ref >59)

## 2022-10-04 ENCOUNTER — Telehealth: Payer: Self-pay

## 2022-10-04 NOTE — Patient Outreach (Signed)
  Care Coordination   Care coordination  Visit Note   10/04/2022 Name: Mary Poole MRN: [id number] DOB: 03/30/1950  Mary Poole is a 72 y.o. year old female who sees Mary Coombe, DO for primary care. I spoke with  Mary Poole by phone today.  What matters to the patients health and wellness today?  Mary Poole request to schedule a time to hear more about care coordination program and complete telephone assessment.   Goals Addressed             This Visit's Progress    Care Coordination Activities       Interventions Today    Flowsheet Row Most Recent Value  General Interventions   General Interventions Discussed/Reviewed General Interventions Discussed  [briefly discussed care coordination program. Scheduled date/time for telephone assessment]            SDOH assessments and interventions completed:  No  Care Coordination Interventions:  Yes, provided   Follow up plan: Follow up call scheduled for 10/13/22    Encounter Outcome:  Pt. Visit Completed   Mary Sheriff, RN, MSN, BSN, CCM Care Management Coordinator [phone]

## 2022-10-13 ENCOUNTER — Telehealth: Payer: Self-pay

## 2022-10-13 NOTE — Patient Outreach (Signed)
  Care Coordination   Care coordination  Visit Note   10/13/2022 Name: ALANA BOZZELLI MRN: [id number] DOB: 1950/05/19  CZESLAWA WEISTER is a 72 y.o. year old female who sees Everrett Coombe, DO for primary care. I spoke with  [name] by phone today.  What matters to the patients health and wellness today?  Ms. Alphonse states this is not a good time and request to reschedule telephone call.    Goals Addressed             This Visit's Progress    Care Coordination Activities       Interventions Today    Flowsheet Row Most Recent Value  General Interventions   General Interventions Discussed/Reviewed General Interventions Reviewed  [Per patient request. telephone assessment rescheduled to 10/18/22]            SDOH assessments and interventions completed:  No  Care Coordination Interventions:  Yes, provided   Follow up plan: Follow up call scheduled for 10/15/22    Encounter Outcome:  Patient Visit Completed   Kathyrn Sheriff, RN, MSN, BSN, CCM Care Management Coordinator [phone]

## 2022-10-18 ENCOUNTER — Telehealth: Payer: Self-pay

## 2022-10-18 NOTE — Patient Outreach (Signed)
  Care Coordination   10/18/2022 Name: Mary Poole MRN: [id number] DOB: 1950/06/06   Care Coordination Outreach Attempts:  A third unsuccessful outreach was attempted today to offer the patient with information about available care coordination services.  Follow Up Plan:  No further outreach attempts will be made at this time. We have been unable to contact the patient to offer or enroll patient in care coordination services  Encounter Outcome:  No Answer   Care Coordination Interventions:  No, not indicated    Kathyrn Sheriff, RN, MSN, BSN, CCM Care Management Coordinator [phone]

## 2022-11-02 ENCOUNTER — Other Ambulatory Visit: Payer: Self-pay | Admitting: Cardiology

## 2022-11-08 ENCOUNTER — Telehealth: Payer: Self-pay | Admitting: Cardiology

## 2022-11-08 NOTE — Telephone Encounter (Signed)
Patient is returning phone call to get a copy of her prescription for Eliquis for insurance purposes. Insurance needs to have Dr. [name] signature on it or stamped. Patient has ran out of Eliquis and needs some samples for Eliquis

## 2022-11-08 NOTE — Telephone Encounter (Signed)
Left message to call back  

## 2022-11-08 NOTE — Telephone Encounter (Signed)
Pt aware I will get paper Rx together and call her this week when ready. She appreciates our help. Will see if can get week/two of samples.

## 2022-11-08 NOTE — Telephone Encounter (Signed)
See other call from today.

## 2022-11-08 NOTE — Telephone Encounter (Signed)
Pt states she needs to pick a copy of her Eliquis prescription. Please advise if this can be printed out for her.

## 2022-11-10 ENCOUNTER — Telehealth: Payer: Self-pay

## 2022-11-10 MED ORDER — APIXABAN 5 MG PO TABS: 5.0000 mg | ORAL_TABLET | Freq: Two times a day (BID) | ORAL | Status: DC

## 2022-11-10 MED ORDER — APIXABAN 5 MG PO TABS: 5.0000 mg | ORAL_TABLET | Freq: Two times a day (BID) | ORAL | 5 refills | Status: DC

## 2022-11-10 NOTE — Telephone Encounter (Signed)
Patient presenting at front desk asking for samples and paper script for eliquis. She states her insurance is requiring a signed paper script in order to fill her eliquis and because of this hold up she has been out of eliquis for a week. Printed script and asked DOD to sign. 2 weeks of samples requested.

## 2022-11-10 NOTE — Telephone Encounter (Signed)
Paper script signed by DOD and 2 weeks of samples given to patient. Patient also given patient assistance form and advised to fill out and bring to our office in the event she may need more samples while application is processing. Patient verbalized understanding.

## 2022-11-10 NOTE — Addendum Note (Signed)
Addended by: Luellen Pucker on: 11/10/2022 01:27 PM   Modules accepted: Orders

## 2022-11-29 DIAGNOSIS — H10021 Other mucopurulent conjunctivitis, right eye: Secondary | ICD-10-CM | POA: Diagnosis not present

## 2022-12-02 ENCOUNTER — Other Ambulatory Visit: Payer: Self-pay | Admitting: Cardiology

## 2022-12-12 ENCOUNTER — Other Ambulatory Visit: Payer: Self-pay | Admitting: Family Medicine

## 2023-01-20 DIAGNOSIS — H43812 Vitreous degeneration, left eye: Secondary | ICD-10-CM | POA: Diagnosis not present

## 2023-01-20 DIAGNOSIS — H04123 Dry eye syndrome of bilateral lacrimal glands: Secondary | ICD-10-CM | POA: Diagnosis not present

## 2023-01-20 DIAGNOSIS — H2513 Age-related nuclear cataract, bilateral: Secondary | ICD-10-CM | POA: Diagnosis not present

## 2023-01-21 NOTE — Patient Outreach (Signed)
  Care Coordination   Case Closure  Visit Note   01/21/2023 Name: Mary Poole MRN: [id number] DOB: [birth year]  Mary Poole is a 72 y.o. year old female who sees Everrett Coombe, DO for primary care. No patient communication during this encounter.  Documentation encounter only-Per review of chart on 10/18/22-unable to reach after multiple attempts. Goals closed out-Case closed.   Goals Addressed             This Visit's Progress    COMPLETED: Care Coordination Activities       Interventions Today    Flowsheet Row Most Recent Value  General Interventions   General Interventions Discussed/Reviewed General Interventions Reviewed  [per review of chart 10/18/22 unable to reach-case closed-careplan closed]            SDOH assessments and interventions completed:  No  Care Coordination Interventions:  No, not indicated   Follow up plan: No further intervention required.   Encounter Outcome:  Patient Visit Completed   Kathyrn Sheriff, RN, MSN, BSN, CCM Care Management Coordinator [phone]

## 2023-01-27 ENCOUNTER — Other Ambulatory Visit (HOSPITAL_COMMUNITY): Payer: Self-pay | Admitting: Cardiology

## 2023-05-12 ENCOUNTER — Ambulatory Visit

## 2023-05-12 VITALS — Ht 65.0 in | Wt 217.0 lb

## 2023-05-12 DIAGNOSIS — Z0000 Encounter for general adult medical examination without abnormal findings: Secondary | ICD-10-CM

## 2023-05-12 DIAGNOSIS — Z780 Asymptomatic menopausal state: Secondary | ICD-10-CM

## 2023-05-12 DIAGNOSIS — Z13820 Encounter for screening for osteoporosis: Secondary | ICD-10-CM

## 2023-05-12 DIAGNOSIS — Z1231 Encounter for screening mammogram for malignant neoplasm of breast: Secondary | ICD-10-CM

## 2023-05-12 NOTE — Patient Instructions (Signed)
  Ms. Rumore , Thank you for taking time to come for your Medicare Wellness Visit. I appreciate your ongoing commitment to your health goals. Please review the following plan we discussed and let me know if I can assist you in the future.   These are the goals we discussed:  Goals       Increase physical activity      Patient Stated (pt-stated)      Would like to loose about 60 lbs.      Patient Stated (pt-stated)      Patient stated that she would like to work on weight loss.      Weight (lb) < 200 lb (90.7 kg)      She states she would like to loss weight.         This is a list of the screening recommended for you and due dates:  Health Maintenance  Topic Date Due   Hepatitis C Screening  Never done   DTaP/Tdap/Td vaccine (1 - Tdap) Never done   Mammogram  Never done   Pneumonia Vaccine (1 of 1 - PCV) Never done   DEXA scan (bone density measurement)  Never done   COVID-19 Vaccine (6 - 2024-25 season) 10/10/2022   Flu Shot  09/09/2023   Medicare Annual Wellness Visit  05/11/2024   Colon Cancer Screening  02/25/2027   Zoster (Shingles) Vaccine  Completed   HPV Vaccine  Aged Out

## 2023-05-12 NOTE — Progress Notes (Signed)
 Subjective:   Mary Poole is a 73 y.o. female who presents for Medicare Annual (Subsequent) preventive examination.  Visit Complete: Virtual I connected with  [name] on 05/12/23 by a audio enabled telemedicine application and verified that I am speaking with the correct person using two identifiers.  Patient Location: Home  Provider Location: Office/Clinic  I discussed the limitations of evaluation and management by telemedicine. The patient expressed understanding and agreed to proceed.  Vital Signs: Because this visit was a virtual/telehealth visit, some criteria may be missing or patient reported. Any vitals not documented were not able to be obtained and vitals that have been documented are patient reported.  Patient Medicare AWV questionnaire was completed by the patient on n/a; I have confirmed that all information answered by patient is correct and no changes since this date.  Cardiac Risk Factors include: advanced age (>39men, >71 women);sedentary lifestyle;smoking/ tobacco exposure;hypertension;obesity (BMI >30kg/m2);family history of premature cardiovascular disease     Objective:    Today's Vitals   05/12/23 1559  Weight: 217 lb (98.4 kg)  Height: 5\' 5"  (1.651 m)   Body mass index is 36.11 kg/m.     05/12/2023    4:07 PM 05/03/2022    2:32 PM 08/05/2021    9:10 AM 04/17/2021   12:39 PM 04/10/2021    1:16 PM 03/12/2021    8:13 AM 04/10/2020    3:15 PM  Advanced Directives  Does Patient Have a Medical Advance Directive? No No No No Yes No Yes  Type of Agricultural consultant;Living will  Living will  Does patient want to make changes to medical advance directive?    No - Guardian declined No - Patient declined  No - Patient declined  Copy of Healthcare Power of Attorney in Chart?     No - copy requested    Would patient like information on creating a medical advance directive? No - Patient declined No - Patient declined No - Patient  declined No - Patient declined  No - Guardian declined     Current Medications (verified) Outpatient Encounter Medications as of 05/12/2023  Medication Sig   acetaminophen (TYLENOL) 650 MG CR tablet Take 650-1,300 mg by mouth every 8 (eight) hours as needed for pain.   apixaban (ELIQUIS) 5 MG TABS tablet Take 1 tablet (5 mg total) by mouth 2 (two) times daily.   diltiazem (CARDIZEM CD) 240 MG 24 hr capsule TAKE 1 CAPSULE (240 MG TOTAL) BY MOUTH EVERY MORNING.   furosemide (LASIX) 20 MG tablet TAKE 1 TABLET BY MOUTH EVERY DAY AS NEEDED FOR SWELLING   metoprolol succinate (TOPROL-XL) 50 MG 24 hr tablet TAKE 1 AND 1/2 TABLETS DAILY (75 MG TOTAL) BY MOUTH WITH FOOD OR IMMEDIATELY AFTER MEALS   Multiple Vitamin (MULTIVITAMIN) LIQD Take 30 mLs by mouth 3 (three) times a week. Nutriburst   omeprazole (PRILOSEC) 40 MG capsule TAKE 1 CAPSULE (40 MG TOTAL) BY MOUTH DAILY. NO REFILLS. NEEDS AN APPT W/PCP.   PRESCRIPTION MEDICATION Inhale into the lungs at bedtime. CPAP   [DISCONTINUED] apixaban (ELIQUIS) 5 MG TABS tablet Take 1 tablet (5 mg total) by mouth 2 (two) times daily.   No facility-administered encounter medications on file as of 05/12/2023.    Allergies (verified) Diphenhydramine hcl and Flecainide   History: Past Medical History:  Diagnosis Date   Anxiety    Atrial fibrillation (HCC)    Complication of anesthesia    "woke up  during colonoscopy 02/2017" (11/09/2017)   GERD (gastroesophageal reflux disease)    H/O atrial flutter    History of blood transfusion 02/2017   LGIB (11/09/2017)   Hypertension    Migraine    "a few/year" (11/09/2017)   OSA on CPAP    Past Surgical History:  Procedure Laterality Date   ATRIAL FIBRILLATION ABLATION N/A 11/09/2017   Procedure: ATRIAL FIBRILLATION ABLATION;  Surgeon: Regan Lemming, MD;  Location: [facility] INVASIVE CV [facility];  Service: Cardiovascular;  Laterality: N/A;   ATRIAL FIBRILLATION ABLATION N/A 08/05/2021   Procedure: ATRIAL FIBRILLATION  ABLATION;  Surgeon: Regan Lemming, MD;  Location: [facility] INVASIVE CV [facility];  Service: Cardiovascular;  Laterality: N/A;   COLONOSCOPY N/A 02/24/2017   Procedure: COLONOSCOPY;  Surgeon: Jeani Hawking, MD;  Location: [facility] ENDOSCOPY;  Service: Gastroenterology;  Laterality: N/A;   ENTEROSCOPY N/A 02/24/2017   Procedure: ENTEROSCOPY;  Surgeon: Jeani Hawking, MD;  Location: [facility] ENDOSCOPY;  Service: Gastroenterology;  Laterality: N/A;   HERNIA REPAIR  1960s   WISDOM TOOTH EXTRACTION     Family History  Problem Relation Age of Onset   CAD Father    AAA (abdominal aortic aneurysm) Sister    Thyroid disease Sister    Allergies Other    Clotting disorder Other    Hypertension Other    Heart disease Other    Rheum arthritis Other    Migraines Other    Social History   Socioeconomic History   Marital status: Married    Spouse name: Cyd Silence.   Number of children: 0   Years of education: 16   Highest education level: Bachelor's degree (e.g., BA, AB, BS)  Occupational History   Occupation: Retired  Tobacco Use   Smoking status: Former    Current packs/day: 0.25    Average packs/day: 0.3 packs/day for 10.0 years (2.5 ttl pk-yrs)    Types: Cigarettes   Smokeless tobacco: Never   Tobacco comments:    11/09/2017 "quit smoking in the 1980s"  Vaping Use   Vaping status: Never Used  Substance and Sexual Activity   Alcohol use: Never   Drug use: Never   Sexual activity: Not Currently  Other Topics Concern   Not on file  Social History Narrative   Lives with her husband. She enjoys reading in her free time.   Social Drivers of Corporate investment banker Strain: Low Risk  (05/12/2023)   Overall Financial Resource Strain (CARDIA)    Difficulty of Paying Living Expenses: Not hard at all  Food Insecurity: No Food Insecurity (05/12/2023)   Hunger Vital Sign    Worried About Running Out of Food in the Last Year: Never true    Ran Out of Food in the Last Year: Never true  Transportation Needs: No  Transportation Needs (05/12/2023)   PRAPARE - Administrator, Civil Service (Medical): No    Lack of Transportation (Non-Medical): No  Physical Activity: Insufficiently Active (05/12/2023)   Exercise Vital Sign    Days of Exercise per Week: 2 days    Minutes of Exercise per Session: 40 min  Stress: No Stress Concern Present (05/12/2023)   [employer] of Occupational Health - Occupational Stress Questionnaire    Feeling of Stress : Not at all  Social Connections: Socially Integrated (05/12/2023)   Social Connection and Isolation Panel [NHANES]    Frequency of Communication with Friends and Family: More than three times a week    Frequency of Social Gatherings with Friends and  Family: More than three times a week    Attends Religious Services: More than 4 times per year    Active Member of Clubs or Organizations: Yes    Attends Banker Meetings: More than 4 times per year    Marital Status: Married    Tobacco Counseling Counseling given: Not Answered Tobacco comments: 11/09/2017 "quit smoking in the 1980s"   Clinical Intake:  Pre-visit preparation completed: Yes  Pain : No/denies pain     BMI - recorded: 36.11 Nutritional Status: BMI > 30  Obese Nutritional Risks: None Diabetes: No  How often do you need to have someone help you when you read instructions, pamphlets, or other written materials from your doctor or pharmacy?: 1 - Never What is the last grade level you completed in school?: 15  Interpreter Needed?: No      Activities of Daily Living    05/12/2023    4:01 PM  In your present state of health, do you have any difficulty performing the following activities:  Hearing? 0  Vision? 0  Difficulty concentrating or making decisions? 0  Walking or climbing stairs? 1  Dressing or bathing? 0  Doing errands, shopping? 0  Preparing Food and eating ? N  Using the Toilet? N  In the past six months, have you accidently leaked urine? N  Do you  have problems with loss of bowel control? N  Managing your Medications? N  Managing your Finances? N  Housekeeping or managing your Housekeeping? N    Patient Care Team: Everrett Coombe, DO as PCP - General (Family Medicine) Regan Lemming, MD as PCP - Electrophysiology (Cardiology) Quintella Reichert, MD as PCP - Cardiology (Cardiology)  Indicate any recent Medical Services you may have received from other than [facility] providers in the past year (date may be approximate).     Assessment:   This is a routine wellness examination for Mary Poole.  Hearing/Vision screen No results found.   Goals Addressed             This Visit's Progress    Weight (lb) < 200 lb (90.7 kg)   217 lb (98.4 kg)    She states she would like to loss weight.       Depression Screen    05/12/2023    4:06 PM 05/03/2022    2:33 PM 04/10/2021    1:13 PM 04/10/2020    3:15 PM 12/27/2018    3:27 PM 10/26/2018    3:36 PM 07/20/2017    9:07 AM  PHQ 2/9 Scores  PHQ - 2 Score 0 0 0 0 0 1 0  PHQ- 9 Score    0  11     Fall Risk    05/12/2023    4:07 PM 05/03/2022    2:33 PM 04/10/2021    1:13 PM 04/10/2020    3:15 PM 12/27/2018    3:27 PM  Fall Risk   Falls in the past year? 0 0 1 1 0  Number falls in past yr: 0 0 0 1 0  Injury with Fall? 0 0 1 0 0  Risk for fall due to : No Fall Risks No Fall Risks History of fall(s) Impaired balance/gait   Follow up Falls evaluation completed Falls evaluation completed Falls evaluation completed Falls evaluation completed Education provided;Falls prevention discussed    MEDICARE RISK AT HOME: Medicare Risk at Home Any stairs in or around the home?: Yes If so, are there any without handrails?: No  Home free of loose throw rugs in walkways, pet beds, electrical cords, etc?: Yes Adequate lighting in your home to reduce risk of falls?: Yes Life alert?: No Use of a cane, walker or w/c?: No Grab bars in the bathroom?: No Shower chair or bench in shower?: No Elevated toilet  seat or a handicapped toilet?: No  TIMED UP AND GO:  Was the test performed?  No    Cognitive Function:        05/12/2023    4:08 PM 05/03/2022    2:41 PM 04/10/2021    1:18 PM  6CIT Screen  What Year? 0 points 0 points 0 points  What month? 0 points 0 points 0 points  What time? 0 points 0 points 0 points  Count back from 20 0 points 0 points 0 points  Months in reverse 0 points 0 points 0 points  Repeat phrase 2 points 2 points 2 points  Total Score 2 points 2 points 2 points    Immunizations Immunization History  Administered Date(s) Administered   PFIZER Comirnaty(Gray Top)Covid-19 Tri-Sucrose Vaccine 08/05/2020   PFIZER(Purple Top)SARS-COV-2 Vaccination 03/24/2019, 04/18/2019, 12/08/2019   Pfizer Covid-19 Vaccine Bivalent Booster 45yrs & up 12/19/2020   Zoster Recombinant(Shingrix) 07/12/2020, 03/24/2021    TDAP status: Due, Education has been provided regarding the importance of this vaccine. Advised may receive this vaccine at local pharmacy or Health Dept. Aware to provide a copy of the vaccination record if obtained from local pharmacy or Health Dept. Verbalized acceptance and understanding.  Flu Vaccine status: Declined, Education has been provided regarding the importance of this vaccine but patient still declined. Advised may receive this vaccine at local pharmacy or Health Dept. Aware to provide a copy of the vaccination record if obtained from local pharmacy or Health Dept. Verbalized acceptance and understanding.  Pneumococcal vaccine status: Due, Education has been provided regarding the importance of this vaccine. Advised may receive this vaccine at local pharmacy or Health Dept. Aware to provide a copy of the vaccination record if obtained from local pharmacy or Health Dept. Verbalized acceptance and understanding.  Covid-19 vaccine status: Information provided on how to obtain vaccines.   Qualifies for Shingles Vaccine? Yes   Zostavax completed No   Shingrix  Completed?: Yes  Screening Tests Health Maintenance  Topic Date Due   Hepatitis C Screening  Never done   DTaP/Tdap/Td (1 - Tdap) Never done   MAMMOGRAM  Never done   Pneumonia Vaccine 90+ Years old (1 of 1 - PCV) Never done   DEXA SCAN  Never done   COVID-19 Vaccine (6 - 2024-25 season) 10/10/2022   INFLUENZA VACCINE  09/09/2023   Medicare Annual Wellness (AWV)  05/11/2024   Colonoscopy  02/25/2027   Zoster Vaccines- Shingrix  Completed   HPV VACCINES  Aged Out    Health Maintenance  Health Maintenance Due  Topic Date Due   Hepatitis C Screening  Never done   DTaP/Tdap/Td (1 - Tdap) Never done   MAMMOGRAM  Never done   Pneumonia Vaccine 63+ Years old (1 of 1 - PCV) Never done   DEXA SCAN  Never done   COVID-19 Vaccine (6 - 2024-25 season) 10/10/2022    Colorectal cancer screening: Type of screening: Colonoscopy. Completed 02/24/2017. Repeat every 7 years  Mammogram status: Ordered 05/12/2023. Pt provided with contact info and advised to call to schedule appt.   Bone Density status: Ordered 05/12/2023. Pt provided with contact info and advised to call to schedule appt.  Lung  Cancer Screening: (Low Dose CT Chest recommended if Age 66-80 years, 20 pack-year currently smoking OR have quit w/in 15years.) does not qualify.   Lung Cancer Screening Referral: n/a  Additional Screening:  Hepatitis C Screening: does qualify; Completed Not yet  Vision Screening: Recommended annual ophthalmology exams for early detection of glaucoma and other disorders of the eye. Is the patient up to date with their annual eye exam?  Yes  Who is the provider or what is the name of the office in which the patient attends annual eye exams? Dr Dione Booze If pt is not established with a provider, would they like to be referred to a provider to establish care?  N/a .   Dental Screening: Recommended annual dental exams for proper oral hygiene   Community Resource Referral / Chronic Care Management: CRR  required this visit?  No   CCM required this visit?  No     Plan:     I have personally reviewed and noted the following in the patient's chart:   Medical and social history Use of alcohol, tobacco or illicit drugs  Current medications and supplements including opioid prescriptions. Patient is not currently taking opioid prescriptions. Functional ability and status Nutritional status Physical activity Advanced directives List of other physicians Hospitalizations, surgeries, and ER visits in previous 12 months Vitals Screenings to include cognitive, depression, and falls Referrals and appointments  In addition, I have reviewed and discussed with patient certain preventive protocols, quality metrics, and best practice recommendations. A written personalized care plan for preventive services as well as general preventive health recommendations were provided to patient.     Esmond Harps, CMA   05/12/2023   After Visit Summary: (MyChart) Due to this being a telephonic visit, the after visit summary with patients personalized plan was offered to patient via MyChart   Nurse Notes:   Mary Poole is a 73 y.o. female patient of Everrett Coombe, DO who had a Medicare Annual Wellness Visit today via telephone. Mary Poole is Retired and lives with their spouse. She does not have any children. She reports that she is socially active and does interact with friends/family regularly. She is minimally physically active and enjoys reading.   Mammogram and Bone Density ordered today.

## 2023-05-17 ENCOUNTER — Ambulatory Visit: Payer: Medicare Other | Admitting: Pulmonary Disease

## 2023-06-05 NOTE — Progress Notes (Unsigned)
 Electrophysiology Office Note:   Date:  06/08/2023  ID:  Mary Poole, DOB 1950-05-03, MRN [id number]  Primary Cardiologist: Gaylyn Keas, MD Primary Heart Failure: None Electrophysiologist: Will Cortland Ding, MD      History of Present Illness:   Mary Poole is a 73 y.o. female with h/o AF, accessory AV pathway, AFL, bradycardia, HTN, OSA & GERD seen today for routine electrophysiology followup.   Since last being seen in our clinic the patient reports doing very well. No issues with AF / palpitations.  Tolerating her medications without issues. No bleeding on OAC. She has been helping with her sister who had a complex AAA repair at [facility]  & is now in rehab.    She denies chest pain, palpitations, dyspnea, PND, orthopnea, nausea, vomiting, dizziness, syncope, edema, weight gain, or early satiety.   Review of systems complete and found to be negative unless listed in HPI.   EP Information / Studies Reviewed:    EKG is ordered today. Personal review as below.  EKG Interpretation Date/Time:  Wednesday June 08 2023 09:34:39 EDT Ventricular Rate:  54 PR Interval:  116 QRS Duration:  96 QT Interval:  444 QTC Calculation: 421 R Axis:   42  Text Interpretation: Sinus bradycardia Confirmed by Creighton Doffing ([id number]) on 06/08/2023 9:54:47 AM   Studies:  ECHO 09/2017 > LVEF 55-60%, no RWMA, moderate MVR, LA severely dilated, increased LA pressure, PA systolic moderately increased, 52.  Cardiac CT 07/2021 > normal pulm vein drainage into LA, dilated LA appendage w/o thrombus, calcium score 197/85th percentile   EPS 08/05/21 > AF on presentation, RF ablation of all 4 PV's, additional LA ablation  Arrhythmia / AAD AF > dx ~ 2018 s/p PVI ablation 11/2017, re-do PVI 07/2021     Risk Assessment/Calculations:    CHA2DS2-VASc Score = 4   This indicates a 4.8% annual risk of stroke. The patient's score is based upon: CHF History: 0 HTN History: 1 Diabetes History: 0 Stroke History:  0 Vascular Disease History: 1 Age Score: 1 Gender Score: 1             Physical Exam:   VS:  BP 122/70   Pulse 83   Ht 5\' 5"  (1.651 m)   Wt 224 lb (101.6 kg)   SpO2 96%   BMI 37.28 kg/m    Wt Readings from Last 3 Encounters:  06/08/23 224 lb (101.6 kg)  05/12/23 217 lb (98.4 kg)  08/27/22 221 lb (100.2 kg)     GEN: Well nourished, well developed in no acute distress NECK: No JVD; No carotid bruits CARDIAC: Regular rate and rhythm, no murmurs, rubs, gallops RESPIRATORY:  Clear to auscultation without rales, wheezing or rhonchi  ABDOMEN: Soft, non-tender, non-distended EXTREMITIES:  No edema; No deformity   ASSESSMENT AND PLAN:    Paroxysmal Atrial Fibrillation  Atrial Flutter CHA2DS2-VASc 4, s/p PVI ablation and re-do  -continue diltiazem , metoprolol   -OAC for stroke prophylaxis  -EKG NSR -no AF symptom burden   -discussed pt to call if new symptoms   Secondary Hypercoagulable State  -continue Eliquis , dose reviewed and appropriate by age/wt  -she requested a paper copy of her Eliquis  prescription and it to be sent electronically (she reports her insurance asked for a paper copy)  Hypertension  -well controlled on current regimen   OSA  -CPAP compliance encouraged   Obesity  -slow gradual weight loss encouraged    Follow up with Dr. Lawana Pray in 12 months  Signed, Cody Das  Alena An, NP-C, AGACNP-BC [facility] - Electrophysiology  06/08/2023, 10:12 AM

## 2023-06-08 ENCOUNTER — Ambulatory Visit: Attending: Pulmonary Disease | Admitting: Pulmonary Disease

## 2023-06-08 ENCOUNTER — Encounter: Payer: Self-pay | Admitting: Pulmonary Disease

## 2023-06-08 VITALS — BP 122/70 | HR 83 | Ht 65.0 in | Wt 224.0 lb

## 2023-06-08 DIAGNOSIS — I10 Essential (primary) hypertension: Secondary | ICD-10-CM

## 2023-06-08 DIAGNOSIS — I4819 Other persistent atrial fibrillation: Secondary | ICD-10-CM | POA: Diagnosis not present

## 2023-06-08 DIAGNOSIS — D6869 Other thrombophilia: Secondary | ICD-10-CM

## 2023-06-08 MED ORDER — APIXABAN 5 MG PO TABS: 5.0000 mg | ORAL_TABLET | Freq: Two times a day (BID) | ORAL | 11 refills | Status: DC

## 2023-06-08 MED ORDER — APIXABAN 5 MG PO TABS: 5.0000 mg | ORAL_TABLET | Freq: Two times a day (BID) | ORAL | 11 refills | Status: AC

## 2023-06-08 NOTE — Patient Instructions (Signed)
 Medication Instructions:  Your physician recommends that you continue on your current medications as directed. Please refer to the Current Medication list given to you today.  *If you need a refill on your cardiac medications before your next appointment, please call your pharmacy*  Lab Work: None ordered If you have labs (blood work) drawn today and your tests are completely normal, you will receive your results only by: MyChart Message (if you have MyChart) OR A paper copy in the mail If you have any lab test that is abnormal or we need to change your treatment, we will call you to review the results.  Follow-Up: At [facility], you and your health needs are our priority.  As part of our continuing mission to provide you with exceptional heart care, our providers are all part of one team.  This team includes your primary Cardiologist (physician) and Advanced Practice Providers or APPs (Physician Assistants and Nurse Practitioners) who all work together to provide you with the care you need, when you need it.  Your next appointment:   1 year(s)  Provider:   Agatha Horsfall, MD only

## 2023-06-09 ENCOUNTER — Other Ambulatory Visit: Payer: Self-pay | Admitting: Cardiology

## 2023-07-30 ENCOUNTER — Other Ambulatory Visit (HOSPITAL_COMMUNITY): Payer: Self-pay | Admitting: Cardiology

## 2023-10-23 ENCOUNTER — Other Ambulatory Visit: Payer: Self-pay | Admitting: Cardiology

## 2024-03-06 ENCOUNTER — Encounter (HOSPITAL_COMMUNITY): Payer: Self-pay

## 2024-03-06 ENCOUNTER — Emergency Department (HOSPITAL_COMMUNITY)
Admission: EM | Admit: 2024-03-06 | Discharge: 2024-03-06 | Disposition: A | Discharge status: Home / Self Care | Attending: Emergency Medicine | Admitting: Emergency Medicine

## 2024-03-06 ENCOUNTER — Emergency Department (HOSPITAL_COMMUNITY)

## 2024-03-06 ENCOUNTER — Other Ambulatory Visit: Payer: Self-pay

## 2024-03-06 DIAGNOSIS — R0602 Shortness of breath: Secondary | ICD-10-CM | POA: Diagnosis present

## 2024-03-06 DIAGNOSIS — I4891 Unspecified atrial fibrillation: Secondary | ICD-10-CM | POA: Insufficient documentation

## 2024-03-06 DIAGNOSIS — D72829 Elevated white blood cell count, unspecified: Secondary | ICD-10-CM | POA: Diagnosis not present

## 2024-03-06 DIAGNOSIS — Z7901 Long term (current) use of anticoagulants: Secondary | ICD-10-CM | POA: Diagnosis not present

## 2024-03-06 DIAGNOSIS — Z0189 Encounter for other specified special examinations: Secondary | ICD-10-CM

## 2024-03-06 LAB — CBC WITH DIFFERENTIAL/PLATELET
Abs Immature Granulocytes: 0.08 10*3/uL — ABNORMAL HIGH (ref 0.00–0.07)
Basophils Absolute: 0 10*3/uL (ref 0.0–0.1)
Basophils Relative: 0 %
Eosinophils Absolute: 0 10*3/uL (ref 0.0–0.5)
Eosinophils Relative: 0 %
HCT: 42 % (ref 36.0–46.0)
Hemoglobin: 13.4 g/dL (ref 12.0–15.0)
Immature Granulocytes: 1 %
Lymphocytes Relative: 12 %
Lymphs Abs: 1.9 10*3/uL (ref 0.7–4.0)
MCH: 28 pg (ref 26.0–34.0)
MCHC: 31.9 g/dL (ref 30.0–36.0)
MCV: 87.9 fL (ref 80.0–100.0)
Monocytes Absolute: 1.3 10*3/uL — ABNORMAL HIGH (ref 0.1–1.0)
Monocytes Relative: 8 %
Neutro Abs: 12.2 10*3/uL — ABNORMAL HIGH (ref 1.7–7.7)
Neutrophils Relative %: 79 %
Platelets: 270 10*3/uL (ref 150–400)
RBC: 4.78 MIL/uL (ref 3.87–5.11)
RDW: 16.3 % — ABNORMAL HIGH (ref 11.5–15.5)
WBC: 15.4 10*3/uL — ABNORMAL HIGH (ref 4.0–10.5)
nRBC: 0 % (ref 0.0–0.2)

## 2024-03-06 LAB — MAGNESIUM: Magnesium: 2 mg/dL (ref 1.7–2.4)

## 2024-03-06 LAB — COMPREHENSIVE METABOLIC PANEL WITH GFR
ALT: 74 U/L — ABNORMAL HIGH (ref 0–44)
AST: 38 U/L (ref 15–41)
Albumin: 3.9 g/dL (ref 3.5–5.0)
Alkaline Phosphatase: 80 U/L (ref 38–126)
Anion gap: 11 (ref 5–15)
BUN: 24 mg/dL — ABNORMAL HIGH (ref 8–23)
CO2: 23 mmol/L (ref 22–32)
Calcium: 9.4 mg/dL (ref 8.9–10.3)
Chloride: 106 mmol/L (ref 98–111)
Creatinine, Ser: 1.14 mg/dL — ABNORMAL HIGH (ref 0.44–1.00)
GFR, Estimated: 51 mL/min — ABNORMAL LOW
Glucose, Bld: 110 mg/dL — ABNORMAL HIGH (ref 70–99)
Potassium: 4.3 mmol/L (ref 3.5–5.1)
Sodium: 140 mmol/L (ref 135–145)
Total Bilirubin: 0.3 mg/dL (ref 0.0–1.2)
Total Protein: 7.3 g/dL (ref 6.5–8.1)

## 2024-03-06 LAB — I-STAT CHEM 8, ED
BUN: 26 mg/dL — ABNORMAL HIGH (ref 8–23)
Calcium, Ion: 1.19 mmol/L (ref 1.15–1.40)
Chloride: 108 mmol/L (ref 98–111)
Creatinine, Ser: 1.2 mg/dL — ABNORMAL HIGH (ref 0.44–1.00)
Glucose, Bld: 114 mg/dL — ABNORMAL HIGH (ref 70–99)
HCT: 43 % (ref 36.0–46.0)
Hemoglobin: 14.6 g/dL (ref 12.0–15.0)
Potassium: 4.1 mmol/L (ref 3.5–5.1)
Sodium: 142 mmol/L (ref 135–145)
TCO2: 23 mmol/L (ref 22–32)

## 2024-03-06 MED ORDER — ETOMIDATE 2 MG/ML IV SOLN
10.0000 mg | Freq: Once | INTRAVENOUS | Status: AC
  Administered 2024-03-06: 10 mg via INTRAVENOUS
  Filled 2024-03-06: qty 10

## 2024-03-06 MED ORDER — ONDANSETRON HCL 4 MG/2ML IJ SOLN
4.0000 mg | Freq: Once | INTRAMUSCULAR | Status: AC
  Administered 2024-03-06: 4 mg via INTRAVENOUS
  Filled 2024-03-06: qty 2

## 2024-03-06 NOTE — ED Triage Notes (Signed)
 Patient states that she is having a heart rate in the 140's for the past 4 days. She does have a history of a-fib. She is having shortness of breath. Denies chest pain at this time.

## 2024-03-06 NOTE — ED Triage Notes (Signed)
 Patient states she was diagnosed with pneumonia and has been taking her antibiotics.

## 2024-03-06 NOTE — ED Notes (Signed)
Consent signed and at bedside  

## 2024-03-06 NOTE — Discharge Instructions (Addendum)
 You were seen for your atrial fibrillation in the emergency department.   At home, please limit your albuterol  use since this can make it worse.  Continue to take your diltiazem  and metoprolol .  It is very important that you continue your Eliquis  for the next month to prevent a stroke  Check your MyChart online for the results of any tests that had not resulted by the time you left the emergency department.   Follow-up with your primary doctor in 2-3 days regarding your visit.  Follow-up with A-fib clinic or your cardiologist to soon as possible  Return immediately to the emergency department if you experience any of the following: Difficulty breathing, palpitations, chest pain, or any other concerning symptoms.    Thank you for visiting our Emergency Department. It was a pleasure taking care of you today.

## 2024-03-06 NOTE — ED Notes (Signed)
 Pt's husband will be driving pt home. Pt A&Ox4 at discharge and ambulatory.

## 2024-03-06 NOTE — ED Provider Notes (Signed)
 " [facility] Provider Note   CSN: [id number] Arrival date & time: 03/06/24  1640     Patient presents with: Tachycardia   Mary Poole is a 74 y.o. female.  {Add pertinent medical, surgical, social history, OB history to HPI:6357} 74 year old female with a history of atrial fibs/flutter on Eliquis , diltiazem , and metoprolol  who presents to the emergency department with shortness of breath and palpitations.  Patient reports that 3 days ago she was diagnosed with pneumonia.  Initially thought she had a URI but her chest x-ray showed infiltrate and she was started on prednisone, albuterol , cefdinir, and doxycycline.  Says that her infectious symptoms have been improving but she started having palpitations and shortness of breath today and checked her heart rate and it was in the 130s to 140s.  Has been compliant with her Eliquis  for the past month.  N.p.o. since 9 AM       Prior to Admission medications  Medication Sig Start Date End Date Taking? Authorizing Provider  acetaminophen  (TYLENOL ) 650 MG CR tablet Take 650-1,300 mg by mouth every 8 (eight) hours as needed for pain.    [provider]  apixaban  (ELIQUIS ) 5 MG TABS tablet Take 1 tablet (5 mg total) by mouth 2 (two) times daily. 06/08/23   Aniceto Daphne CROME, NP  diltiazem  (CARDIZEM  CD) 240 MG 24 hr capsule TAKE 1 CAPSULE (240 MG TOTAL) BY MOUTH EVERY MORNING. 08/02/23   Camnitz, Soyla Lunger, MD  furosemide  (LASIX ) 20 MG tablet TAKE 1 TABLET BY MOUTH EVERY DAY AS NEEDED FOR SWELLING 10/24/23   Camnitz, Soyla Lunger, MD  metoprolol  succinate (TOPROL -XL) 50 MG 24 hr tablet TAKE 1 AND 1/2 TABLETS DAILY (75 MG TOTAL) BY MOUTH WITH FOOD OR IMMEDIATELY AFTER MEALS 06/10/23   Shlomo Wilbert SAUNDERS, MD  Multiple Vitamin (MULTIVITAMIN) LIQD Take 30 mLs by mouth 3 (three) times a week. [employer]    [provider]  omeprazole  (PRILOSEC) 40 MG capsule TAKE 1 CAPSULE (40 MG TOTAL) BY MOUTH  DAILY. NO REFILLS. NEEDS AN APPT W/PCP. 06/09/22   Alvia Bring, DO  PRESCRIPTION MEDICATION Inhale into the lungs at bedtime. CPAP    [provider]    Allergies: Diphenhydramine  hcl and Flecainide     Review of Systems  Updated Vital Signs BP (!) 134/101   Pulse (!) 149   Temp 98.2 F (36.8 C)   Resp 17   Ht 5' 5 (1.651 m)   Wt 104.3 kg   SpO2 96%   BMI 38.27 kg/m   Physical Exam Vitals and nursing note reviewed.  Constitutional:      General: She is not in acute distress.    Appearance: She is well-developed.  HENT:     Head: Normocephalic and atraumatic.     Right Ear: External ear normal.     Left Ear: External ear normal.     Nose: Nose normal.  Eyes:     Extraocular Movements: Extraocular movements intact.     Conjunctiva/sclera: Conjunctivae normal.     Pupils: Pupils are equal, round, and reactive to light.  Cardiovascular:     Rate and Rhythm: Regular rhythm. Tachycardia present.     Heart sounds: No murmur heard. Pulmonary:     Effort: Pulmonary effort is normal. No respiratory distress.     Breath sounds: Normal breath sounds.  Musculoskeletal:     Cervical back: Normal range of motion and neck supple.     Right lower leg:  No edema.     Left lower leg: No edema.  Skin:    General: Skin is warm and dry.  Neurological:     Mental Status: She is alert and oriented to person, place, and time. Mental status is at baseline.  Psychiatric:        Mood and Affect: Mood normal.     (all labs ordered are listed, but only abnormal results are displayed) Labs Reviewed - No data to display  EKG: None  Radiology: No results found.  {Document cardiac monitor, telemetry assessment procedure when appropriate:32947} Procedures   Medications Ordered in the ED - No data to display    {Click here for ABCD2, HEART and other calculators REFRESH Note before signing:1}                              Medical Decision Making Amount and/or Complexity of  Data Reviewed Labs: ordered. Radiology: ordered.   ***  {Document critical care time when appropriate  Document review of labs and clinical decision tools ie CHADS2VASC2, etc  Document your independent review of radiology images and any outside records  Document your discussion with family members, caretakers and with consultants  Document social determinants of health affecting pt's care  Document your decision making why or why not admission, treatments were needed:32947:::1}   Final diagnoses:  None    ED Discharge Orders     None        "

## 2024-03-06 NOTE — Sedation Documentation (Signed)
 120 J, one shock given

## 2024-03-07 ENCOUNTER — Telehealth (HOSPITAL_COMMUNITY): Payer: Self-pay

## 2024-03-07 NOTE — Telephone Encounter (Signed)
Reached out to patient to schedule ED f/u appointment. No answer left voicemail.

## 2024-06-08 ENCOUNTER — Ambulatory Visit: Admitting: Pulmonary Disease
